# Patient Record
Sex: Male | Born: 1935 | Race: White | Hispanic: No | Marital: Married | Smoking: Former smoker
Health system: Southern US, Community
[De-identification: ages and names within clinical notes are randomized; demographics above are authoritative.]

## PROBLEM LIST (undated history)

## (undated) DIAGNOSIS — E785 Hyperlipidemia, unspecified: Secondary | ICD-10-CM

## (undated) DIAGNOSIS — R7309 Other abnormal glucose: Secondary | ICD-10-CM

## (undated) DIAGNOSIS — M1990 Unspecified osteoarthritis, unspecified site: Secondary | ICD-10-CM

## (undated) DIAGNOSIS — M899 Disorder of bone, unspecified: Secondary | ICD-10-CM

## (undated) DIAGNOSIS — M949 Disorder of cartilage, unspecified: Secondary | ICD-10-CM

## (undated) DIAGNOSIS — I4891 Unspecified atrial fibrillation: Secondary | ICD-10-CM

## (undated) DIAGNOSIS — E291 Testicular hypofunction: Secondary | ICD-10-CM

## (undated) DIAGNOSIS — J8410 Pulmonary fibrosis, unspecified: Secondary | ICD-10-CM

## (undated) DIAGNOSIS — G4730 Sleep apnea, unspecified: Secondary | ICD-10-CM

## (undated) DIAGNOSIS — H9190 Unspecified hearing loss, unspecified ear: Secondary | ICD-10-CM

## (undated) DIAGNOSIS — J449 Chronic obstructive pulmonary disease, unspecified: Secondary | ICD-10-CM

## (undated) HISTORY — DX: Other abnormal glucose: R73.09

## (undated) HISTORY — DX: Sleep apnea, unspecified: G47.30

## (undated) HISTORY — DX: Disorder of bone, unspecified: M89.9

## (undated) HISTORY — DX: Unspecified hearing loss, unspecified ear: H91.90

## (undated) HISTORY — DX: Hyperlipidemia, unspecified: E78.5

## (undated) HISTORY — DX: Chronic obstructive pulmonary disease, unspecified: J44.9

## (undated) HISTORY — DX: Testicular hypofunction: E29.1

## (undated) HISTORY — PX: ROTATOR CUFF REPAIR: SHX139

## (undated) HISTORY — DX: Unspecified osteoarthritis, unspecified site: M19.90

## (undated) HISTORY — DX: Disorder of cartilage, unspecified: M94.9

## (undated) HISTORY — PX: TONSILLECTOMY: SUR1361

---
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
## 2004-02-19 ENCOUNTER — Ambulatory Visit: Payer: Self-pay | Admitting: Internal Medicine

## 2004-06-28 ENCOUNTER — Ambulatory Visit: Payer: Self-pay | Admitting: Internal Medicine

## 2004-07-31 ENCOUNTER — Ambulatory Visit: Payer: Self-pay | Admitting: Internal Medicine

## 2004-10-29 ENCOUNTER — Ambulatory Visit: Payer: Self-pay | Admitting: Internal Medicine

## 2004-10-30 ENCOUNTER — Ambulatory Visit (HOSPITAL_COMMUNITY): Admission: RE | Admit: 2004-10-30 | Discharge: 2004-10-31 | Payer: Self-pay | Admitting: Orthopedic Surgery

## 2005-01-03 ENCOUNTER — Ambulatory Visit: Payer: Self-pay | Admitting: Internal Medicine

## 2005-07-28 ENCOUNTER — Ambulatory Visit: Payer: Self-pay | Admitting: Internal Medicine

## 2005-09-17 ENCOUNTER — Ambulatory Visit: Payer: Self-pay | Admitting: Internal Medicine

## 2006-02-11 ENCOUNTER — Ambulatory Visit: Payer: Self-pay | Admitting: Internal Medicine

## 2006-02-11 LAB — CONVERTED CEMR LAB
AST: 32 units/L (ref 0–37)
Cholesterol: 94 mg/dL (ref 0–200)
HDL: 32.5 mg/dL — ABNORMAL LOW (ref >39.0)
Triglyceride fasting, serum: 68 mg/dL (ref 0–149)

## 2006-07-29 DIAGNOSIS — E785 Hyperlipidemia, unspecified: Secondary | ICD-10-CM | POA: Insufficient documentation

## 2006-07-31 ENCOUNTER — Ambulatory Visit: Payer: Self-pay | Admitting: Internal Medicine

## 2006-08-14 ENCOUNTER — Encounter: Payer: Self-pay | Admitting: Internal Medicine

## 2006-08-14 ENCOUNTER — Ambulatory Visit: Payer: Self-pay | Admitting: Family Medicine

## 2006-08-18 ENCOUNTER — Telehealth (INDEPENDENT_AMBULATORY_CARE_PROVIDER_SITE_OTHER): Payer: Self-pay | Admitting: *Deleted

## 2006-08-18 LAB — CONVERTED CEMR LAB
Calcium: 8.7 mg/dL (ref 8.4–10.5)
Chloride: 103 meq/L (ref 96–112)
GFR calc Af Amer: 94 mL/min
GFR calc non Af Amer: 78 mL/min
Glucose, Bld: 112 mg/dL — ABNORMAL HIGH (ref 70–99)
PSA: 1.44 ng/mL (ref 0.10–4.00)
Sodium: 137 meq/L (ref 135–145)

## 2006-09-29 ENCOUNTER — Ambulatory Visit: Payer: Self-pay | Admitting: Internal Medicine

## 2006-09-30 ENCOUNTER — Encounter: Payer: Self-pay | Admitting: Internal Medicine

## 2006-10-06 ENCOUNTER — Telehealth: Payer: Self-pay | Admitting: Internal Medicine

## 2006-11-16 ENCOUNTER — Ambulatory Visit: Payer: Self-pay | Admitting: Internal Medicine

## 2006-11-18 LAB — CONVERTED CEMR LAB
Albumin: 3.5 g/dL (ref 3.5–5.2)
Bilirubin, Direct: 0.1 mg/dL (ref 0.0–0.3)
Total Bilirubin: 0.7 mg/dL (ref 0.3–1.2)
Total Protein: 6.7 g/dL (ref 6.0–8.3)

## 2007-07-21 ENCOUNTER — Telehealth (INDEPENDENT_AMBULATORY_CARE_PROVIDER_SITE_OTHER): Payer: Self-pay | Admitting: *Deleted

## 2007-08-19 ENCOUNTER — Telehealth (INDEPENDENT_AMBULATORY_CARE_PROVIDER_SITE_OTHER): Payer: Self-pay | Admitting: *Deleted

## 2007-11-15 ENCOUNTER — Ambulatory Visit: Payer: Self-pay | Admitting: Internal Medicine

## 2007-11-15 DIAGNOSIS — M899 Disorder of bone, unspecified: Secondary | ICD-10-CM | POA: Insufficient documentation

## 2007-11-15 DIAGNOSIS — M949 Disorder of cartilage, unspecified: Secondary | ICD-10-CM

## 2007-11-15 HISTORY — DX: Disorder of bone, unspecified: M89.9

## 2007-12-07 ENCOUNTER — Telehealth (INDEPENDENT_AMBULATORY_CARE_PROVIDER_SITE_OTHER): Payer: Self-pay | Admitting: *Deleted

## 2007-12-16 ENCOUNTER — Telehealth (INDEPENDENT_AMBULATORY_CARE_PROVIDER_SITE_OTHER): Payer: Self-pay | Admitting: *Deleted

## 2008-04-03 ENCOUNTER — Telehealth (INDEPENDENT_AMBULATORY_CARE_PROVIDER_SITE_OTHER): Payer: Self-pay | Admitting: *Deleted

## 2008-04-03 ENCOUNTER — Ambulatory Visit: Payer: Self-pay | Admitting: Family Medicine

## 2008-04-19 ENCOUNTER — Telehealth (INDEPENDENT_AMBULATORY_CARE_PROVIDER_SITE_OTHER): Payer: Self-pay | Admitting: *Deleted

## 2008-05-15 ENCOUNTER — Telehealth (INDEPENDENT_AMBULATORY_CARE_PROVIDER_SITE_OTHER): Payer: Self-pay | Admitting: *Deleted

## 2008-05-17 ENCOUNTER — Ambulatory Visit: Payer: Self-pay | Admitting: Internal Medicine

## 2008-05-17 DIAGNOSIS — J309 Allergic rhinitis, unspecified: Secondary | ICD-10-CM | POA: Insufficient documentation

## 2008-05-17 DIAGNOSIS — R7309 Other abnormal glucose: Secondary | ICD-10-CM

## 2008-05-17 DIAGNOSIS — R7303 Prediabetes: Secondary | ICD-10-CM | POA: Insufficient documentation

## 2008-05-17 HISTORY — DX: Other abnormal glucose: R73.09

## 2008-05-18 LAB — CONVERTED CEMR LAB: Hgb A1c MFr Bld: 5.9 % (ref 4.6–6.0)

## 2008-10-26 ENCOUNTER — Encounter: Payer: Self-pay | Admitting: Internal Medicine

## 2008-10-26 ENCOUNTER — Ambulatory Visit: Payer: Self-pay | Admitting: Family Medicine

## 2008-11-15 ENCOUNTER — Ambulatory Visit: Payer: Self-pay | Admitting: Internal Medicine

## 2008-11-20 ENCOUNTER — Encounter (INDEPENDENT_AMBULATORY_CARE_PROVIDER_SITE_OTHER): Payer: Self-pay | Admitting: *Deleted

## 2008-11-20 LAB — CONVERTED CEMR LAB
ALT: 21 units/L (ref 0–53)
AST: 31 units/L (ref 0–37)
Calcium: 9.3 mg/dL (ref 8.4–10.5)
Chloride: 104 meq/L (ref 96–112)
Cholesterol: 141 mg/dL (ref 0–200)
HDL: 30.4 mg/dL — ABNORMAL LOW (ref >39.00)
Hgb A1c MFr Bld: 5.8 % (ref 4.6–6.5)
Potassium: 4.1 meq/L (ref 3.5–5.1)
Total CHOL/HDL Ratio: 5
VLDL: 40 mg/dL (ref 0.0–40.0)

## 2008-12-19 ENCOUNTER — Encounter (INDEPENDENT_AMBULATORY_CARE_PROVIDER_SITE_OTHER): Payer: Self-pay | Admitting: *Deleted

## 2008-12-19 ENCOUNTER — Ambulatory Visit: Payer: Self-pay | Admitting: Internal Medicine

## 2009-05-08 ENCOUNTER — Ambulatory Visit: Payer: Self-pay | Admitting: Internal Medicine

## 2009-07-06 ENCOUNTER — Ambulatory Visit: Payer: Self-pay | Admitting: Internal Medicine

## 2009-07-11 LAB — CONVERTED CEMR LAB
AST: 29 units/L (ref 0–37)
Creatinine, Ser: 1 mg/dL (ref 0.4–1.5)
HDL: 31 mg/dL — ABNORMAL LOW (ref >39.00)
LDL Cholesterol: 68 mg/dL (ref 0–99)
Potassium: 4 meq/L (ref 3.5–5.1)
Sodium: 139 meq/L (ref 135–145)
Triglycerides: 194 mg/dL — ABNORMAL HIGH (ref 0.0–149.0)

## 2009-09-10 ENCOUNTER — Ambulatory Visit: Payer: Self-pay | Admitting: Internal Medicine

## 2009-11-05 ENCOUNTER — Ambulatory Visit: Payer: Self-pay | Admitting: Internal Medicine

## 2009-11-05 DIAGNOSIS — H9190 Unspecified hearing loss, unspecified ear: Secondary | ICD-10-CM

## 2009-11-05 HISTORY — DX: Unspecified hearing loss, unspecified ear: H91.90

## 2009-11-05 LAB — CONVERTED CEMR LAB
Basophils Absolute: 0.1 10*3/uL (ref 0.0–0.1)
Basophils Relative: 0.7 % (ref 0.0–3.0)
Eosinophils Relative: 11.4 % — ABNORMAL HIGH (ref 0.0–5.0)
Hemoglobin: 13.9 g/dL (ref 13.0–17.0)
Hgb A1c MFr Bld: 6.1 % (ref 4.6–6.5)
Lymphocytes Relative: 39.4 % (ref 12.0–46.0)
MCHC: 34.7 g/dL (ref 30.0–36.0)
MCV: 96.5 fL (ref 78.0–100.0)
Monocytes Relative: 8.1 % (ref 3.0–12.0)
Platelets: 241 10*3/uL (ref 150.0–400.0)
RBC: 4.14 M/uL — ABNORMAL LOW (ref 4.22–5.81)
TSH: 1.62 microintl units/mL (ref 0.35–5.50)

## 2009-11-06 ENCOUNTER — Ambulatory Visit: Payer: Self-pay | Admitting: Internal Medicine

## 2009-11-20 ENCOUNTER — Telehealth: Payer: Self-pay | Admitting: Internal Medicine

## 2009-11-27 ENCOUNTER — Encounter: Payer: Self-pay | Admitting: Internal Medicine

## 2010-04-21 LAB — CONVERTED CEMR LAB
AST: 29 units/L (ref 0–37)
Basophils Relative: 0.4 % (ref 0.0–3.0)
CO2: 32 meq/L (ref 19–32)
Calcium: 9.3 mg/dL (ref 8.4–10.5)
Chloride: 109 meq/L (ref 96–112)
Eosinophils Absolute: 0.2 10*3/uL (ref 0.0–0.7)
Eosinophils Relative: 2.6 % (ref 0.0–5.0)
GFR calc Af Amer: 106 mL/min
GFR calc non Af Amer: 88 mL/min
Glucose, Bld: 102 mg/dL — ABNORMAL HIGH (ref 70–99)
Hemoglobin: 13.8 g/dL (ref 13.0–17.0)
Lymphocytes Relative: 45.8 % (ref 12.0–46.0)
MCV: 97.2 fL (ref 78.0–100.0)
Potassium: 4.3 meq/L (ref 3.5–5.1)
TSH: 1.69 microintl units/mL (ref 0.35–5.50)
Total CHOL/HDL Ratio: 4.4
VLDL: 22 mg/dL (ref 0–40)

## 2010-04-23 NOTE — Consult Note (Signed)
Summary: [facility]  [facility]   Imported By: Lanelle Bal 12/06/2009 10:50:04  _____________________________________________________________________  External Attachment:    Type:   Image     Comment:   External Document

## 2010-04-23 NOTE — Assessment & Plan Note (Signed)
Summary: 6 mth fu/kdc   Vital Signs:  Patient profile:   77 year old male Weight:      185.50 pounds Pulse rate:   63 / minute Pulse rhythm:   regular BP sitting:   122 / 76  (left arm) Cuff size:   large  Vitals Entered By: Army Fossa CMA (November 05, 2009 7:59 AM) CC: 6 month f/u :Fasting   History of Present Illness: ROV ENT? has hearing loss  Hyperlipidemia-- good medication compliance  borderline DM--  diet continue to be  healthy     Current Medications (verified): 1)  Aspirin 81 Mg Tabs (Aspirin) .Marland Kitchen.. 1 Tablet By Mouth A Day 2)  Mvi .Marland Kitchen.. 1 Tablet Every Day 3)  Pravachol 40 Mg Tabs (Pravastatin Sodium) .Marland Kitchen.. 1 By Mouth At Bedtime 4)  Caltrate 600+d 600-400 Mg-Unit Tabs (Calcium Carbonate-Vitamin D) .... Qd  Allergies: 1)  ! * Tetanus 2)  ! * Flu Vaccination  Past History:  Past Medical History: Reviewed history from 09/10/2009 and no changes required. Hyperlipidemia Osteopenia per DEXA 07-2006, declined medicines borderline DM ---> A1C 5.9  04-2008 Allergic Rhinitis   Past Surgical History: Reviewed history from 11/15/2007 and no changes required. Tonsillectomy and adenoidectomy  Social History: Reviewed history from 05/08/2009 and no changes required. Married 1 child Former Smoker (quit after 51 years on 11/23/2007) Alcohol use-yes (occasionally) Regular exercise-yes (4-5 x/wk) [employer] x 25 years   Review of Systems ENT:  on tinnitus no ear pain. CV:  Denies chest pain or discomfort and swelling of feet. Resp:  Denies cough, coughing up blood, sputum productive, and wheezing; goes camping, outdoor activities without dyspnea on exertion.  Physical Exam  General:  alert, well-developed, and well-nourished.   Ears:  R ear normal and L ear normal.   Lungs:  normal respiratory effort, no intercostal retractions, no accessory muscle use,   normal breath sounds;few dry crackers at the bases? Heart:  normal rate, regular rhythm, no murmur, and no gallop.    Extremities:  no pretibial edema bilaterally  Psych:  Cognition and judgment appear intact. Alert and cooperative with normal attention span and concentration. not anxious appearing and not depressed appearing.     Impression & Recommendations:  Problem # 1:  HYPERGLYCEMIA (ICD-790.29) due for labs  Labs Reviewed: Creat: 1.0 (07/06/2009)     Orders: Venipuncture (83151) TLB-A1C / Hgb A1C (Glycohemoglobin) (83036-A1C) TLB-Microalbumin/Creat Ratio, Urine (82043-MALB) Specimen Handling (76160)  Problem # 2:  OSTEOPENIA (ICD-733.90) labs His updated medication list for this problem includes:    Caltrate 600+d 600-400 Mg-unit Tabs (Calcium carbonate-vitamin d) ..... Qd  Vit D:30 (11/15/2008)  Orders: TLB-TSH (Thyroid Stimulating Hormone) (84443-TSH) TLB-CBC Platelet - w/Differential (85025-CBCD)  Problem # 3:  HYPERLIPIDEMIA (ICD-272.4) well controlled His updated medication list for this problem includes:    Pravachol 40 Mg Tabs (Pravastatin sodium) .Marland Kitchen... 1 by mouth at bedtime  Problem # 4:  DECREASED HEARING (ICD-389.9) decreased hearing as reported by the patient Recommend to see an audiologist, numbers provided  Problem # 5:  ? of COPD (ICD-496) patient is a former heavy smoker, totally asymptomatic however on exam there is some few dry crackles at the bases. Will order a chest x-ray  Orders: T-2 View CXR (71020TC) Specimen Handling (73710)  Complete Medication List: 1)  Aspirin 81 Mg Tabs (Aspirin) .Marland Kitchen.. 1 tablet by mouth a day 2)  Mvi  .Marland Kitchen.. 1 tablet every day 3)  Pravachol 40 Mg Tabs (Pravastatin sodium) .Marland Kitchen.. 1 by mouth at bedtime  4)  Caltrate 600+d 600-400 Mg-unit Tabs (Calcium carbonate-vitamin d) .... Qd  Patient Instructions: 1)  Please schedule a follow-up appointment in 6 months , fasting, yearly checkup Prescriptions: PRAVACHOL 40 MG TABS (PRAVASTATIN SODIUM) 1 by mouth at bedtime  #30 x 6   Entered by:   Army Fossa CMA   Authorized by:   Nolon Rod.  Paz MD   Signed by:   Army Fossa CMA on 11/05/2009   Method used:   Electronically to        [facility] (retail)       [address]       [place], [place]  [address]       Ph: [phone] or [phone]       Fax: [phone]   RxID:   [id number]

## 2010-04-23 NOTE — Letter (Signed)
Summary: Physical Exam Form/[employer]  Physical Exam Form/[employer]   Imported By: Lanelle Bal 09/14/2009 13:51:47  _____________________________________________________________________  External Attachment:    Type:   Image     Comment:   External Document

## 2010-04-23 NOTE — Assessment & Plan Note (Signed)
Summary: fill out form for [employer]   Vital Signs:  Patient profile:   77 year old male Height:      71 inches Weight:      186 pounds Temp:     98.3 degrees F oral Pulse rate:   80 / minute BP sitting:   130 / 78  (left arm)  Vitals Entered By: Jeremy Johann CMA (September 10, 2009 2:05 PM) CC: fill out forms Comments REVIEWED MED LIST, PATIENT AGREED DOSE AND INSTRUCTION CORRECT    History of Present Illness: ROV needs a form filled, very active, no problems     Allergies: 1)  ! * Tetanus 2)  ! * Flu Vaccination  Past History:  Past Medical History: Hyperlipidemia Osteopenia per DEXA 07-2006, declined medicines borderline DM ---> A1C 5.9  04-2008 Allergic Rhinitis   Past Surgical History: Reviewed history from 11/15/2007 and no changes required. Tonsillectomy and adenoidectomy  Social History: Reviewed history from 05/08/2009 and no changes required. Married 1 child Former Smoker (quit after 51 years on 11/23/2007) Alcohol use-yes (occasionally) Regular exercise-yes (4-5 x/wk) [employer] x 25 years   Review of Systems CV:  Denies chest pain or discomfort, palpitations, and swelling of feet. Resp:  Denies cough and shortness of breath. GI:  Denies bloody stools, nausea, and vomiting. Allergy:  symptoms well controlled at present.  Physical Exam  General:  alert, well-developed, and well-nourished.   Ears:  R ear normal and L ear normal.   Mouth:  no redness or d/c  Neck:  no masses, no thyromegaly, normal carotid upstroke, and no carotid bruits.   Lungs:  normal respiratory effort, no intercostal retractions, no accessory muscle use, and normal breath sounds.   Heart:  normal rate, regular rhythm, no murmur, and no gallop.   Abdomen:  soft, non-tender, no distention, and no masses.     Impression & Recommendations:  Problem # 1:  HYPERLIPIDEMIA (ICD-272.4) no change His updated medication list for this problem includes:    Pravachol 40 Mg Tabs  (Pravastatin sodium) .Marland Kitchen... 1 by mouth at bedtime  Labs Reviewed: SGOT: 29 (07/06/2009)   SGPT: 20 (07/06/2009)   HDL:31.00 (07/06/2009), 30.40 (11/15/2008)  LDL:68 (07/06/2009), 71 (11/15/2008)  Chol:138 (07/06/2009), 141 (11/15/2008)  Trig:194.0 (07/06/2009), 200.0 (11/15/2008)  Problem # 2:  HYPERGLYCEMIA (ICD-790.29) A1c 8-11 5.8, recheck yearly Labs Reviewed: Creat: 1.0 (07/06/2009)     Problem # 3:  ALLERGIC RHINITIS (ICD-477.9) asymptomatic  Problem # 4:  forms filled  Complete Medication List: 1)  Aspirin 81 Mg Tabs (Aspirin) .Marland Kitchen.. 1 tablet by mouth a day 2)  Mvi  .Marland Kitchen.. 1 tablet every day 3)  Pravachol 40 Mg Tabs (Pravastatin sodium) .Marland Kitchen.. 1 by mouth at bedtime  Patient Instructions: 1)  Please schedule a follow-up appointment in 3 months : fasting, yearly checkup   Tetanus/Td Immunization History:    Tetanus/Td # 1:  allergic to Td  (09/10/2009)

## 2010-04-23 NOTE — Progress Notes (Signed)
Summary: labs and x-ray results  Phone Note Outgoing Call   Summary of Call: advised patient -chest x-ray looks okay; the right shoulder showed resorption of the clavicle, if he is having pain in the right shoulder, please refer to orthopedic surgery. this is likely a complication of heavy use of the shoulder. -his sugar is stable Jose E. Paz MD  November 20, 2009 12:11 PM   Follow-up for Phone Call        Pt is aware, discussed the Right shoulder issue- he states he did not have surgery in the past and he is okay with seeing the ortho surgeon. Army Fossa CMA  November 20, 2009 12:36 PM   New Problems: SHOULDER PAIN, RIGHT (ICD-719.41)   New Problems: SHOULDER PAIN, RIGHT (ICD-719.41)

## 2010-04-23 NOTE — Assessment & Plan Note (Signed)
Summary: 6 mth/ns/kdc   Vital Signs:  Patient profile:   77 year old male Height:      71 inches Weight:      179.8 pounds BMI:     25.17 Pulse rate:   60 / minute BP sitting:   150 / 80  Vitals Entered By: Dena Billet CC: f/u Comments Generic for Advicor? pt. complains of hard to breath when going to sleep, wheezing   History of Present Illness: ROV ----> doing well   Allergies: 1)  ! * Tetanus 2)  ! * Flu Vaccination  Past History:  Past Medical History: Hyperlipidemia Osteopenia per DEXA 07-2006, declined medicines borderline DM ---> A1C 5.9  04-2008 Allergic Rhinitis   Past Surgical History: Reviewed history from 11/15/2007 and no changes required. Tonsillectomy and adenoidectomy  Social History: Reviewed history from 11/15/2008 and no changes required. Married 1 child Former Smoker (quit after 51 years on 11/23/2007) Alcohol use-yes (occasionally) Regular exercise-yes (4-5 x/wk) [employer] x 25 years   Review of Systems       Hyperlipidemia-- advicor $$$ borderline DM-- very active, watching his diet  BP slightly elevated today, no ambulatory BPs     Physical Exam  General:  alert, well-developed, and well-nourished.   Lungs:  normal respiratory effort, no intercostal retractions, no accessory muscle use, and normal breath sounds.   Heart:  normal rate, regular rhythm, no murmur, and no gallop.   Extremities:  no pretibial edema bilaterally    Impression & Recommendations:  Problem # 1:  HYPERLIPIDEMIA (ICD-272.4) advicor ----> $$$ switch to pravachol see instructions  The following medications were removed from the medication list:    Advicor 1000-20 Mg Tb24 (Niacin-lovastatin) .Marland Kitchen... 1 by mouth qd His updated medication list for this problem includes:    Pravachol 40 Mg Tabs (Pravastatin sodium) .Marland Kitchen... 1 by mouth at bedtime  Problem # 2:  HYPERGLYCEMIA (ICD-790.29) stable  per last A1C  Complete Medication List: 1)  Aspirin 81 Mg Tabs  (Aspirin) .Marland Kitchen.. 1 tablet by mouth a day 2)  Mvi  .Marland Kitchen.. 1 tablet every day 3)  Pravachol 40 Mg Tabs (Pravastatin sodium) .Marland Kitchen.. 1 by mouth at bedtime  Patient Instructions: 1)  switch to pravachol 2)  call if side effects 3)  blood work in 2 months , fasting: 4)  FLP , AST ALT BMP  Dx high chol 5)  Please schedule a follow-up appointment in 6 months .  Prescriptions: PRAVACHOL 40 MG TABS (PRAVASTATIN SODIUM) 1 by mouth at bedtime  #30 x 6   Entered and Authorized by:   Nolon Rod. Gardiner Espana MD   Signed by:   Nolon Rod. Toiya Morrish MD on 05/08/2009   Method used:   Print then Give to Patient   RxID:   [id number]

## 2010-05-07 ENCOUNTER — Other Ambulatory Visit: Payer: Self-pay | Admitting: Internal Medicine

## 2010-05-07 ENCOUNTER — Encounter: Payer: Self-pay | Admitting: Internal Medicine

## 2010-05-07 ENCOUNTER — Encounter (INDEPENDENT_AMBULATORY_CARE_PROVIDER_SITE_OTHER): Payer: 59 | Admitting: Internal Medicine

## 2010-05-07 DIAGNOSIS — E785 Hyperlipidemia, unspecified: Secondary | ICD-10-CM

## 2010-05-07 DIAGNOSIS — Z0000 Encounter for general adult medical examination without abnormal findings: Secondary | ICD-10-CM

## 2010-05-07 DIAGNOSIS — R7309 Other abnormal glucose: Secondary | ICD-10-CM

## 2010-05-07 DIAGNOSIS — Z87891 Personal history of nicotine dependence: Secondary | ICD-10-CM

## 2010-05-07 DIAGNOSIS — Z125 Encounter for screening for malignant neoplasm of prostate: Secondary | ICD-10-CM

## 2010-05-07 DIAGNOSIS — R0989 Other specified symptoms and signs involving the circulatory and respiratory systems: Secondary | ICD-10-CM

## 2010-05-07 LAB — PSA: PSA: 1.14 ng/mL (ref 0.10–4.00)

## 2010-05-07 LAB — ALT: ALT: 21 U/L (ref 0–53)

## 2010-05-07 LAB — BASIC METABOLIC PANEL
CO2: 29 mEq/L (ref 19–32)
Chloride: 104 mEq/L (ref 96–112)
Creatinine, Ser: 1 mg/dL (ref 0.4–1.5)
GFR: 77.25 mL/min (ref >60.00)
Glucose, Bld: 99 mg/dL (ref 70–99)
Potassium: 4.3 mEq/L (ref 3.5–5.1)

## 2010-05-07 LAB — LIPID PANEL
Cholesterol: 117 mg/dL (ref 0–200)
HDL: 27.6 mg/dL — ABNORMAL LOW (ref >39.00)
Triglycerides: 144 mg/dL (ref 0.0–149.0)

## 2010-05-07 LAB — AST: AST: 29 U/L (ref 0–37)

## 2010-05-08 ENCOUNTER — Encounter (INDEPENDENT_AMBULATORY_CARE_PROVIDER_SITE_OTHER): Payer: Self-pay | Admitting: *Deleted

## 2010-05-09 ENCOUNTER — Encounter: Payer: Self-pay | Admitting: Internal Medicine

## 2010-05-09 ENCOUNTER — Encounter (INDEPENDENT_AMBULATORY_CARE_PROVIDER_SITE_OTHER): Payer: Medicare Other

## 2010-05-09 DIAGNOSIS — J449 Chronic obstructive pulmonary disease, unspecified: Secondary | ICD-10-CM

## 2010-05-15 ENCOUNTER — Encounter: Payer: Self-pay | Admitting: Internal Medicine

## 2010-05-15 NOTE — Letter (Signed)
Summary: Physical Exam for [employer]  Physical Exam for [employer]   Imported By: Maryln Gottron 05/09/2010 10:51:20  _____________________________________________________________________  External Attachment:    Type:   Image     Comment:   External Document

## 2010-05-15 NOTE — Assessment & Plan Note (Signed)
Summary: YEARLY CHECK//CBS/PH   Vital Signs:  Patient profile:   77 year old male Height:      71.5 inches Weight:      188.25 pounds BMI:     25.98 Pulse rate:   80 / minute Pulse rhythm:   regular BP sitting:   122 / 80  (left arm) Cuff size:   large  Vitals Entered By: Army Fossa CMA (May 07, 2010 8:18 AM) 1    CC: cpx, fasting  Comments no complaints  declines pneumvax  not had colonoscopy [facility]    History of Present Illness: Here for Medicare AWV:  1.   Risk factors based on Past M, S, F history: reviewed  2.   Physical Activities: very active, walks daily, takes hikes w/ [employer]  3.   Depression/mood: no problems  4.   Hearing:   some decrease hearing but no problems reported or noted during a normal conversation 5.   ADL's: totally independent  6.   Fall Risk: low risk 7.   Home Safety: does feel safe  8.   Height, weight, &visual acuity: see VS, vision corrected w/ glasses  9.   Counseling: yes  10.   Labs ordered based on risk factors: yes  11.           Referral Coordination, if needed  12.           Care Plan, see a/p  13.            Cognitive Assessment: cognition and memory seem appropiate for age   in addition, we discussed the following issues Needs a form for the [employer] Hyperlipidemia-- good medication compliance  Osteopenia per DEXA 07-2006, declined medicines but takes Ca and Vit D borderline DM --->diet very healthy, no ambulatory CBGs   Current Medications (verified): 1)  Aspirin 81 Mg Tabs (Aspirin) .Marland Kitchen.. 1 Tablet By Mouth A Day 2)  Mvi .Marland Kitchen.. 1 Tablet Every Day 3)  Pravachol 40 Mg Tabs (Pravastatin Sodium) .Marland Kitchen.. 1 By Mouth At Bedtime 4)  Caltrate 600+d 600-400 Mg-Unit Tabs (Calcium Carbonate-Vitamin D) .... Qd  Allergies (verified): 1)  ! * Tetanus 2)  ! * Flu Vaccination  Past History:  Past Medical History: Reviewed history from 09/10/2009 and no changes required. Hyperlipidemia Osteopenia per DEXA 07-2006,  declined medicines borderline DM ---> A1C 5.9  04-2008 Allergic Rhinitis   Past Surgical History: Reviewed history from 11/15/2007 and no changes required. Tonsillectomy and adenoidectomy  Family History: Reviewed history from 11/15/2007 and no changes required. breast Ca - M HTN - F DM - no prostate Ca - no CAD - no stroke - no colon ca -- no  Social History: Reviewed history from 05/08/2009 and no changes required. Married 1 child Former Smoker (quit after 51 years on 11/23/2007) Alcohol use-yes (occasionally) Regular exercise-yes (4-5 x/wk) [employer] x 25 years   Review of Systems General:  Denies fatigue and fever; some wt gain . CV:  Denies chest pain or discomfort and swelling of feet. Resp:  Denies cough and shortness of breath. GI:  Denies bloody stools, diarrhea, and nausea. GU:  Denies hematuria, urinary frequency, and urinary hesitancy.  Physical Exam  General:  alert, well-developed, and well-nourished.   Neck:  no masses, no thyromegaly, normal carotid upstroke, and no carotid bruits.   Lungs:  normal respiratory effort, no intercostal retractions, no accessory muscle use,   normal breath sounds;few dry crackers at the bases  Heart:  normal  rate, regular rhythm, no murmur, and no gallop.   Abdomen:  soft, non-tender, no distention, and no masses.   Rectal:  + ext hemorrhoids . Normal sphincter tone. No rectal masses or tenderness. hem neg  Prostate:  Prostate gland firm and smooth, no enlargement, nodularity, tenderness, mass, asymmetry or induration. Extremities:  no pretibial edema bilaterally  Neurologic:  alert & oriented X3, strength normal in all extremities, and gait normal.   Psych:  Cognition and judgment appear intact. Alert and cooperative with normal attention span and concentration.    Impression & Recommendations:  Problem # 1:  HEALTH SCREENING (ICD-V70.0) Td-- allergic influenza shot-- allergic pneumonia shot--declined Shingles  immunization--benefits discussed, prescription provided  hemocults (-) 2005,2006,2007 iFOB neg 2010 Colonoscopy Vs.iFOB  reviewed w/ pt. Provided iFOB  in case he decide to do them, otherwise, he will call and ask for a colonoscopy   EKG @ baseline   DRE neg, check a PSA  encouraged to continue w/ healthy life style [employer] form completed   Orders: EKG w/ Interpretation (93000) Medicare -1st Annual Wellness Visit [phone])  Problem # 2:  ? of COPD (ICD-496)  patient is a former heavy smoker, totally asymptomatic however on exam there is some few dry crackles at the bases. chest x-ray last year showed some hyperinflation Spirometry-- constrictive pattern, ?good effort ----> get formal PFTs  Orders: Spirometry w/Graph (94010) Pulmonary Referral (Pulmonary)  Problem # 3:  HYPERGLYCEMIA (ICD-790.29)  Orders: TLB-A1C / Hgb A1C (Glycohemoglobin) (83036-A1C)  Problem # 4:  OSTEOPENIA (ICD-733.90) per DEXA 07-2006, has declined medicines before last Vit D 20120 normal on Ca and Vit D repeat dexa?  will think about it His updated medication list for this problem includes:    Caltrate 600+d 600-400 Mg-unit Tabs (Calcium carbonate-vitamin d) ..... Qd  Problem # 5:  HYPERLIPIDEMIA (ICD-272.4) labs  His updated medication list for this problem includes:    Pravachol 40 Mg Tabs (Pravastatin sodium) .Marland Kitchen... 1 by mouth at bedtime  Labs Reviewed: SGOT: 29 (07/06/2009)   SGPT: 20 (07/06/2009)   HDL:31.00 (07/06/2009), 30.40 (11/15/2008)  LDL:68 (07/06/2009), 71 (11/15/2008)  Chol:138 (07/06/2009), 141 (11/15/2008)  Trig:194.0 (07/06/2009), 200.0 (11/15/2008)  Orders: Venipuncture ([address]) TLB-BMP (Basic Metabolic Panel-BMET) (80048-METABOL) TLB-ALT (SGPT) (84460-ALT) TLB-AST (SGOT) (84450-SGOT) TLB-Lipid Panel (80061-LIPID) Specimen Handling (09811) EKG w/ Interpretation (93000)  Complete Medication List: 1)  Aspirin 81 Mg Tabs (Aspirin) .Marland Kitchen.. 1 tablet by mouth a day 2)  Mvi   .Marland Kitchen.. 1 tablet every day 3)  Pravachol 40 Mg Tabs (Pravastatin sodium) .Marland Kitchen.. 1 by mouth at bedtime 4)  Caltrate 600+d 600-400 Mg-unit Tabs (Calcium carbonate-vitamin d) .... Qd 5)  Zostavax 91478 Unt/0.78ml Solr (Zoster vaccine live) .Marland Kitchen.. 1 s.q.  Other Orders: TLB-PSA (Prostate Specific Antigen) (84153-PSA)  Patient Instructions: 1)  Please schedule a follow-up appointment in 6 months .  Prescriptions: ZOSTAVAX 29562 UNT/0.65ML SOLR (ZOSTER VACCINE LIVE) 1 s.q.  #1 x 0   Entered and Authorized by:   Nolon Rod. Layne Lebon MD   Signed by:   Nolon Rod. Shlomo Seres MD on 05/07/2010   Method used:   Print then Give to Patient   RxID:   [id number]    Orders Added: 1)  Venipuncture [84132] 2)  TLB-BMP (Basic Metabolic Panel-BMET) [80048-METABOL] 3)  TLB-ALT (SGPT) [84460-ALT] 4)  TLB-AST (SGOT) [84450-SGOT] 5)  TLB-A1C / Hgb A1C (Glycohemoglobin) [83036-A1C] 6)  TLB-PSA (Prostate Specific Antigen) [84153-PSA] 7)  TLB-Lipid Panel [80061-LIPID] 8)  Specimen Handling [99000] 9)  EKG w/ Interpretation [93000] 10)  Spirometry w/Graph [94010] 11)  Pulmonary Referral [Pulmonary] 12)  Medicare -1st Annual Wellness Visit [G0438] 13)  Est. Patient Level III [04540]

## 2010-05-15 NOTE — Letter (Signed)
Summary: [facility]: Immunoassay Fecal Occult Blood (iFOB) Order Form  [facility]  [address] [place], [place] [address]   Phone: [phone]  Fax: [phone]      [facility]: Immunoassay Fecal Occult Blood (iFOB) Order Form   May 08, 2010 MRN: [id number]   Joshua Bullock January 22, 1933   Physicican Name:____jose paz,md _____________________  Diagnosis Code:_____v76.51_____________________      Army Fossa CMA

## 2010-06-04 NOTE — Assessment & Plan Note (Signed)
Summary: copd/ pft per dr paz/ rene brassfield //kp   Allergies: 1)  ! * Tetanus 2)  ! * Flu Vaccination   Other Orders: Carbon Monoxide diffusing w/capacity ([address]) Lung Volumes/Gas dilution or washout ([address]) Spirometry (Pre & Post) [phone])

## 2010-06-18 ENCOUNTER — Other Ambulatory Visit: Payer: Self-pay | Admitting: Internal Medicine

## 2010-06-18 MED ORDER — PRAVASTATIN SODIUM 40 MG PO TABS: 40.0000 mg | ORAL_TABLET | Freq: Every day | ORAL | Status: DC

## 2010-06-21 ENCOUNTER — Other Ambulatory Visit: Payer: Medicare Other

## 2010-06-21 DIAGNOSIS — Z1211 Encounter for screening for malignant neoplasm of colon: Secondary | ICD-10-CM

## 2010-08-09 NOTE — Op Note (Signed)
NAME:  Joshua Bullock, Joshua Bullock NO.:  [id number]   MEDICAL RECORD NO.:  [id number]          PATIENT TYPE:  OIB   LOCATION:  1321                         FACILITY:  [facility]   PHYSICIAN:  Marlowe Kays, M.D.  DATE OF BIRTH:  1932-10-24   DATE OF PROCEDURE:  10/30/2004  DATE OF DISCHARGE:                                 OPERATIVE REPORT   PREOPERATIVE DIAGNOSES:  1.  Degenerative arthritis, acromioclavicular joint.  2.  Chronic impingement syndrome with rotator cuff tendinopathy.  3.  Possible labral tear, right shoulder.   POSTOPERATIVE DIAGNOSES:  1.  Degenerative arthritis, acromioclavicular joint.  2.  Chronic impingement syndrome with rotator cuff tendinopathy.  3.  Labral and partial rotator cuff tears, right shoulder.   OPERATION:  1.  Right shoulder arthroscopy with debridement of articular surface of      rotator cuff and debridement of small labral tear.  2.  Arthroscopic subacromial decompression.  3.  Open resection distal clavicle/.   SURGEON:  Marlowe Kays, M.D.   FIRST ASSISTANT:  Dooley L. Idolina Primer, P.A.-C.   PA-C.   ANESTHESIA:  General; see anesthesia.   PROCEDURE:  Chronic pain right shoulder with MRI demonstrating diagnoses 1,  2 and 3.  See operative description below for additional details.   DESCRIPTION OF PROCEDURE:  Prophylactic antibiotic, satisfactory general  anesthesia. Following interscalene block, he was placed on a Schlein frame.  Right shoulder was prepped with DuraPrep and draped in a sterile field.  It  was marked out, including the distal clavicle excision site and posterior  and lateral portals as well as the subacromial space were all infiltrated  with 1/2% Marcaine with adrenaline mainly for hemostasis since it had a  scalene block.  The posterior soft spot portal to atraumatically enter the  glenohumeral joint.  On inspection he had a balled up area labrum  posteriorly and a good bit of fraying of the undersurface of  the rotator  cuff.  The remainder of the shoulder joint was rather unremarkable.  Accordingly, I advanced the scope between the biceps and the subscapularis  and using switching stick made an anterior incision.  Over this I placed a  metal cannula followed by 4.2 shaver and debrided the undersurface of the  rotator cuff and the area of labral disruption.  We evacuated all fluid  possible from the joint and redirected the scope in the subacromial area.  Through a lateral portal I used a metal cannula followed by a 4.2 shaver.  He had a good bit of bursal surface wear on the rotator cuff as well.  I  debrided it as well as the subacromial space with the 4.2 shaver and then  used the 90 degree ArthroCare vaporizer to remove soft tissue on the  undersurface of acromion working around the leading edge ere from the edge  and excising a large coracoacromial ligament.  The distal clavicle was  impinging on the rotator cuff was easily visualized and was documented.  I  then used a 4-0 oval bur thoroughly decompressing the subacromial space with  documentary pictures taken with his arm to his side and arm  abducted.  I  then evacuated all fluid from subacromial space and made an open incision on  the distal clavicle, isolating it and identifying the [facility] joint.  I measured  medially 1.5 cm and marked the clavicle at this point.  I then undermined  the clavicle cautiously with a key elevator and then placed two baby  Bennett's and used a microsaw to amputate through the clavicle at this  point. We grasped the cut clavicle with a towel clip and excised it with  Bovie dissection.  I then checked and there was no more bone that needed  removal.  The bone  edge was covered with bone wax and irrigated and the  resection site packed with Gelfoam.  I then closed the fascia overlying it  with a interrupted #1 Vicryl and subcutaneous tissue with 2-0 Vicryl and the  skin with the Steri-Strips.  The three portal  closed with 4-0 nylon. Adaptic  was placed over them.  A dry sterile dressing and short immobilizer applied.  He tolerated the procedure well and was taken to recovery in satisfactory  condition with no known complications.       JA/MEDQ  D:  10/30/2004  T:  10/31/2004  Job:  [id number]

## 2010-09-30 ENCOUNTER — Other Ambulatory Visit: Payer: Medicare Other

## 2010-09-30 ENCOUNTER — Other Ambulatory Visit: Payer: Self-pay | Admitting: Internal Medicine

## 2010-09-30 ENCOUNTER — Encounter: Payer: Self-pay | Admitting: *Deleted

## 2010-09-30 DIAGNOSIS — Z1211 Encounter for screening for malignant neoplasm of colon: Secondary | ICD-10-CM

## 2010-09-30 LAB — FECAL OCCULT BLOOD, IMMUNOCHEMICAL: Fecal Occult Bld: NEGATIVE

## 2010-10-03 ENCOUNTER — Encounter: Payer: Self-pay | Admitting: *Deleted

## 2010-11-05 ENCOUNTER — Ambulatory Visit (INDEPENDENT_AMBULATORY_CARE_PROVIDER_SITE_OTHER): Payer: Medicare Other | Admitting: Internal Medicine

## 2010-11-05 ENCOUNTER — Encounter: Payer: Self-pay | Admitting: Internal Medicine

## 2010-11-05 VITALS — BP 128/76 | HR 67 | Temp 98.4°F | Resp 14 | Wt 190.5 lb

## 2010-11-05 DIAGNOSIS — E785 Hyperlipidemia, unspecified: Secondary | ICD-10-CM

## 2010-11-05 DIAGNOSIS — R7303 Prediabetes: Secondary | ICD-10-CM

## 2010-11-05 DIAGNOSIS — R7309 Other abnormal glucose: Secondary | ICD-10-CM

## 2010-11-05 DIAGNOSIS — J449 Chronic obstructive pulmonary disease, unspecified: Secondary | ICD-10-CM

## 2010-11-05 LAB — CBC WITH DIFFERENTIAL/PLATELET
Basophils Absolute: 0.1 10*3/uL (ref 0.0–0.1)
Hemoglobin: 13.7 g/dL (ref 13.0–17.0)
Lymphocytes Relative: 40.8 % (ref 12.0–46.0)
Lymphs Abs: 3.9 10*3/uL (ref 0.7–4.0)
Monocytes Absolute: 0.8 10*3/uL (ref 0.1–1.0)

## 2010-11-05 LAB — HEMOGLOBIN A1C: Hgb A1c MFr Bld: 6 % (ref 4.6–6.5)

## 2010-11-05 NOTE — Progress Notes (Signed)
  Subjective:    Patient ID: Joshua Bullock, male    DOB: [birth year], 77 y.o.   MRN: [id number]  HPI Routine office visit, feeling well, good medication compliance with all meds without apparent side effects  Past Medical History: Hyperlipidemia Osteopenia per DEXA 07-2006, declined medicines borderline DM ---> A1C 5.9  04-2008 Allergic Rhinitis  COPD; PFTs 04-2010 mild to moderate dz, normal volumes  Past Surgical History: Tonsillectomy and adenoidectomy   Review of Systems Denies any cough, shortness of breath or wheezing. No sputum production. No chest pain, lower extremity edema     Objective:   Physical Exam  Constitutional: He is oriented to person, place, and time. He appears well-developed and well-nourished.  Cardiovascular: Normal rate, regular rhythm and normal heart sounds.   No murmur heard. Pulmonary/Chest:       Few dry crackles at bases, not a new finding, otherwise lungs normal.  Musculoskeletal: He exhibits no edema.  Neurological: He is alert and oriented to person, place, and time.          Assessment & Plan:  DIABETES Chart and pertinent labs reviewed Labs   COPD  Per PFTs 2-12, lst CXR 8-11, he is a former smoker , asx  Plan: observation  HYPERLIPIDEMIA pertinent labs reviewed No change

## 2010-11-06 ENCOUNTER — Telehealth: Payer: Self-pay | Admitting: *Deleted

## 2010-11-06 NOTE — Telephone Encounter (Signed)
Patient Informed

## 2010-11-06 NOTE — Telephone Encounter (Signed)
Message copied by Regis Bill on Wed Nov 06, 2010  4:53 PM ------      Message from: Willow Ora E      Created: Wed Nov 06, 2010  4:07 PM       Advise patient: A1c stable. Good results

## 2011-05-09 ENCOUNTER — Ambulatory Visit (INDEPENDENT_AMBULATORY_CARE_PROVIDER_SITE_OTHER): Payer: Medicare Other | Admitting: Internal Medicine

## 2011-05-09 VITALS — BP 140/84 | HR 68 | Temp 97.6°F | Ht 71.5 in | Wt 192.0 lb

## 2011-05-09 DIAGNOSIS — M949 Disorder of cartilage, unspecified: Secondary | ICD-10-CM

## 2011-05-09 DIAGNOSIS — Z129 Encounter for screening for malignant neoplasm, site unspecified: Secondary | ICD-10-CM

## 2011-05-09 DIAGNOSIS — Z0000 Encounter for general adult medical examination without abnormal findings: Secondary | ICD-10-CM

## 2011-05-09 DIAGNOSIS — R7309 Other abnormal glucose: Secondary | ICD-10-CM

## 2011-05-09 DIAGNOSIS — J449 Chronic obstructive pulmonary disease, unspecified: Secondary | ICD-10-CM

## 2011-05-09 DIAGNOSIS — E785 Hyperlipidemia, unspecified: Secondary | ICD-10-CM

## 2011-05-09 DIAGNOSIS — M899 Disorder of bone, unspecified: Secondary | ICD-10-CM

## 2011-05-09 DIAGNOSIS — Z125 Encounter for screening for malignant neoplasm of prostate: Secondary | ICD-10-CM

## 2011-05-09 HISTORY — DX: Chronic obstructive pulmonary disease, unspecified: J44.9

## 2011-05-09 LAB — LIPID PANEL
HDL: 31.9 mg/dL — ABNORMAL LOW (ref >39.00)
LDL Cholesterol: 69 mg/dL (ref 0–99)
Total CHOL/HDL Ratio: 4

## 2011-05-09 LAB — COMPREHENSIVE METABOLIC PANEL
ALT: 26 U/L (ref 0–53)
BUN: 13 mg/dL (ref 6–23)
Calcium: 9.2 mg/dL (ref 8.4–10.5)
Chloride: 103 mEq/L (ref 96–112)
Potassium: 4.3 mEq/L (ref 3.5–5.1)
Sodium: 138 mEq/L (ref 135–145)

## 2011-05-09 LAB — PSA: PSA: 1.52 ng/mL (ref 0.10–4.00)

## 2011-05-09 NOTE — Assessment & Plan Note (Addendum)
Td-- allergic influenza shot-- allergic pneumonia shot--declined Shingles immunization discussed before  He is a very active 78 y/o gent, cont with screenings , ordering a PSA (waiver may be needed) Reports remote Cscopes, previous hemocults-iFOBs negative Request referral to Dr Katharine Look  encouraged to continue w/ healthy life style [employer] form completed  C/O what seem to be hot flashes, discussed possible labs, will consider to check  testosterone in the future

## 2011-05-09 NOTE — Assessment & Plan Note (Signed)
Former smoker, fine crackles at bases on exam. PFTs 2012 documented mild-moderate dz  Plan: observation, doing well

## 2011-05-09 NOTE — Assessment & Plan Note (Signed)
Good med compliance , labs  

## 2011-05-09 NOTE — Assessment & Plan Note (Addendum)
Per DEXA 2008 On Ca and vit D, very active. Offered repeated DEXA, declined, he was not interested on meds in the past and still is not interested

## 2011-05-09 NOTE — Assessment & Plan Note (Signed)
Very healthy life style, labs

## 2011-05-09 NOTE — Progress Notes (Signed)
  Subjective:    Patient ID: Joshua Bullock, male    DOB: December 07, 1932, 78 y.o.   MRN: [id number]  HPI Here for Medicare AWV:  1. Risk factors based on Past M, S, F history: reviewed  2. Physical Activities: very active, walks 3/week,  takes hikes w/ the [employer]  3. Depression/mood: no problems  4. Hearing: decreased  hearing , referral done  5. ADL's: totally independent  6. Fall Risk: low risk, counseled  7. Home Safety: does feel safe  8. Height, weight, &visual acuity: see VS, vision corrected w/ glasses  9. Counseling: yes  10. Labs ordered based on risk factors: yes  11.           Referral Coordination, if needed  12.           Care Plan, see a/p  13.            Cognitive Assessment: cognition and memory seem appropiate for age   in addition, we discussed the following issues Needs a form for the [employer] Hot flashes-- feels hot at night sometimes, last 30 minutes, no sweats or associated sx , no recent cough or wt loss  Hyperlipidemia-- good medication compliance  Osteopenia per DEXA 07-2006, declined medicines but takes Ca and Vit D borderline DM --->diet very healthy, no ambulatory CBGs    Past Medical History:  Hyperlipidemia  Osteopenia per DEXA 07-2006, declined medicines  borderline DM Allergic Rhinitis  COPD; PFTs 04-2010 mild to moderate dz, normal volumes  H/o colitis in the 80s  Past Surgical History:  Tonsillectomy and adenoidectomy  Family History: breast Ca - M prostate Ca - no colon ca -- no HTN - F DM - no CAD - no stroke - no   Social History: Married, 1 child Former Smoker (quit after 51 years on 11/23/2007) Alcohol use-yes (occasionally) Regular exercise-yes (3 x/wk) [employer] x 26 years   Review of Systems  Respiratory: Negative for cough and shortness of breath.   Cardiovascular: Negative for chest pain, palpitations and leg swelling.  Gastrointestinal: Negative for abdominal pain and blood in stool.  Genitourinary: Negative for  dysuria and hematuria.       Objective:   Physical Exam  Constitutional: He is oriented to person, place, and time. He appears well-developed and well-nourished.  HENT:  Head: Normocephalic and atraumatic.  Neck: No thyromegaly present.  Cardiovascular: Normal rate, regular rhythm and normal heart sounds.   No murmur heard. Pulmonary/Chest:       Dry crackles at bases, otherwise wnl  Abdominal: Soft. He exhibits no distension. There is no tenderness. There is no rebound and no guarding.  Genitourinary: Rectum normal and prostate normal. Guaiac negative stool.       External hemorrhoids  Musculoskeletal: He exhibits no edema.  Neurological: He is alert and oriented to person, place, and time.  Psychiatric: He has a normal mood and affect. His behavior is normal. Judgment and thought content normal.       Assessment & Plan:

## 2011-05-10 ENCOUNTER — Encounter: Payer: Self-pay | Admitting: Internal Medicine

## 2011-05-10 LAB — VITAMIN D 25 HYDROXY (VIT D DEFICIENCY, FRACTURES): Vit D, 25-Hydroxy: 33 ng/mL (ref 30–89)

## 2011-05-12 ENCOUNTER — Encounter: Payer: Self-pay | Admitting: Gastroenterology

## 2011-05-12 ENCOUNTER — Encounter: Payer: Self-pay | Admitting: *Deleted

## 2011-05-13 ENCOUNTER — Encounter: Payer: Self-pay | Admitting: *Deleted

## 2011-05-14 ENCOUNTER — Encounter: Payer: Self-pay | Admitting: *Deleted

## 2011-05-30 ENCOUNTER — Ambulatory Visit (AMBULATORY_SURGERY_CENTER): Payer: Medicare Other | Admitting: *Deleted

## 2011-05-30 VITALS — Ht 71.0 in | Wt 192.0 lb

## 2011-05-30 DIAGNOSIS — Z1211 Encounter for screening for malignant neoplasm of colon: Secondary | ICD-10-CM

## 2011-05-30 MED ORDER — PEG-KCL-NACL-NASULF-NA ASC-C 100 G PO SOLR: ORAL | Status: DC

## 2011-05-30 NOTE — Progress Notes (Signed)
Pt. States he had a colon about 12 years ago but can't remember the doctor's name and it was here in [place].  Exam was normal

## 2011-06-04 ENCOUNTER — Other Ambulatory Visit: Payer: Self-pay | Admitting: Internal Medicine

## 2011-06-04 NOTE — Telephone Encounter (Signed)
Refill done.  

## 2011-06-13 ENCOUNTER — Encounter: Payer: Self-pay | Admitting: Gastroenterology

## 2011-06-13 ENCOUNTER — Ambulatory Visit (AMBULATORY_SURGERY_CENTER): Payer: Medicare Other | Admitting: Gastroenterology

## 2011-06-13 VITALS — BP 135/93 | HR 68 | Temp 95.3°F | Resp 20 | Ht 71.0 in | Wt 192.0 lb

## 2011-06-13 DIAGNOSIS — D126 Benign neoplasm of colon, unspecified: Secondary | ICD-10-CM

## 2011-06-13 DIAGNOSIS — K5730 Diverticulosis of large intestine without perforation or abscess without bleeding: Secondary | ICD-10-CM

## 2011-06-13 DIAGNOSIS — Z1211 Encounter for screening for malignant neoplasm of colon: Secondary | ICD-10-CM

## 2011-06-13 DIAGNOSIS — K648 Other hemorrhoids: Secondary | ICD-10-CM

## 2011-06-13 MED ORDER — SODIUM CHLORIDE 0.9 % IV SOLN: 500.0000 mL | INTRAVENOUS | Status: DC

## 2011-06-13 NOTE — Patient Instructions (Signed)
Discharge instructions given with verbal understanding. Handouts on polyps,diverticulosis and hemorrhoids given. Resume previous medications. YOU HAD AN ENDOSCOPIC PROCEDURE TODAY AT THE [facility]: Refer to the procedure report that was given to you for any specific questions about what was found during the examination.  If the procedure report does not answer your questions, please call your gastroenterologist to clarify.  If you requested that your care partner not be given the details of your procedure findings, then the procedure report has been included in a sealed envelope for you to review at your convenience later.  YOU SHOULD EXPECT: Some feelings of bloating in the abdomen. Passage of more gas than usual.  Walking can help get rid of the air that was put into your GI tract during the procedure and reduce the bloating. If you had a lower endoscopy (such as a colonoscopy or flexible sigmoidoscopy) you may notice spotting of blood in your stool or on the toilet paper. If you underwent a bowel prep for your procedure, then you may not have a normal bowel movement for a few days.  DIET: Your first meal following the procedure should be a light meal and then it is ok to progress to your normal diet.  A half-sandwich or bowl of soup is an example of a good first meal.  Heavy or fried foods are harder to digest and may make you feel nauseous or bloated.  Likewise meals heavy in dairy and vegetables can cause extra gas to form and this can also increase the bloating.  Drink plenty of fluids but you should avoid alcoholic beverages for 24 hours.  ACTIVITY: Your care partner should take you home directly after the procedure.  You should plan to take it easy, moving slowly for the rest of the day.  You can resume normal activity the day after the procedure however you should NOT DRIVE or use heavy machinery for 24 hours (because of the sedation medicines used during the test).    SYMPTOMS TO  REPORT IMMEDIATELY: A gastroenterologist can be reached at any hour.  During normal business hours, 8:30 AM to 5:00 PM Monday through Friday, call [phone].  After hours and on weekends, please call the GI answering service at [phone] who will take a message and have the physician on call contact you.   Following lower endoscopy (colonoscopy or flexible sigmoidoscopy):  Excessive amounts of blood in the stool  Significant tenderness or worsening of abdominal pains  Swelling of the abdomen that is new, acute  Fever of 100F or higher  FOLLOW UP: If any biopsies were taken you will be contacted by phone or by letter within the next 1-3 weeks.  Call your gastroenterologist if you have not heard about the biopsies in 3 weeks.  Our staff will call the home number listed on your records the next business day following your procedure to check on you and address any questions or concerns that you may have at that time regarding the information given to you following your procedure. This is a courtesy call and so if there is no answer at the home number and we have not heard from you through the emergency physician on call, we will assume that you have returned to your regular daily activities without incident.  SIGNATURES/CONFIDENTIALITY: You and/or your care partner have signed paperwork which will be entered into your electronic medical record.  These signatures attest to the fact that that the information above on your After Visit   Summary has been reviewed and is understood.  Full responsibility of the confidentiality of this discharge information lies with you and/or your care-partner.  

## 2011-06-13 NOTE — Progress Notes (Signed)
Patient did not experience any of the following events: a burn prior to discharge; a fall within the facility; wrong site/side/patient/procedure/implant event; or a hospital transfer or hospital admission upon discharge from the facility. (G8907) Patient did not have preoperative order for IV antibiotic SSI prophylaxis. (G8918)  

## 2011-06-13 NOTE — Progress Notes (Signed)
Propofol given per Paulita Cradle CRNA  O2 up to 4/L and titrated per Paulita Cradle CRNA

## 2011-06-13 NOTE — Op Note (Signed)
[facility] [address] [place], [place]  [address]  COLONOSCOPY PROCEDURE REPORT  PATIENT:  Joshua Bullock, Joshua Bullock  MR#:  [id number] BIRTHDATE:  [birth year], 78 yrs. old  GENDER:  male ENDOSCOPIST:  Barbette Hair. Arlyce Dice, MD REF. BY:  Willow Ora, M.D. PROCEDURE DATE:  06/13/2011 PROCEDURE:  Colonoscopy with snare polypectomy, Colon with cold biopsy polypectomy ASA CLASS:  Class II INDICATIONS:  Routine Risk Screening MEDICATIONS:   MAC sedation, administered by CRNA propofol 200mg IV  DESCRIPTION OF PROCEDURE:   After the risks benefits and alternatives of the procedure were thoroughly explained, informed consent was obtained.  Digital rectal exam was performed and revealed no abnormalities.   The LB [id number]-[id number] endoscope was introduced through the anus and advanced to the cecum, which was identified by both the appendix and ileocecal valve, without limitations.  The quality of the prep was excellent, using MoviPrep.  The instrument was then slowly withdrawn as the colon was fully examined. <<PROCEDUREIMAGES>>  FINDINGS:  A sessile polyp was found in the ascending colon. It was 3 mm in size. Flat polyp Polyp was snared without cautery. Retrieval was successful (see image7). snare polyp  A diminutive polyp was found in the descending colon. It was 2 mm in size. The polyp was removed using cold biopsy forceps (see image9). Scattered diverticula were found in the ascending colon (see image5).  Moderate diverticulosis was found in the sigmoid colon (see image10).  Internal Hemorrhoids were found (see image11). This was otherwise a normal examination of the colon (see image3 and image2).   Retroflexed views in the rectum revealed no abnormalities.    The time to cecum =  1) 3.50  minutes. The scope was then withdrawn in  1) 10.0  minutes from the cecum and the procedure completed. COMPLICATIONS:  None ENDOSCOPIC IMPRESSION: 1) 3 mm sessile polyp in the ascending colon 2)  2 mm diminutive polyp in the descending colon 3) Diverticula, scattered in the ascending colon 4) Moderate diverticulosis in the sigmoid colon 5) Internal hemorrhoids 6) Otherwise normal examination RECOMMENDATIONS: 1) If the polyp(s) removed today are proven to be adenomatous (pre-cancerous) polyps, you will need a repeat colonoscopy in 5 years. Otherwise you should continue to follow colorectal cancer screening guidelines for "routine risk" patients with colonoscopy in 10 years. You will receive a letter within 1-2 weeks with the results of your biopsy as well as final recommendations. Please call my office if you have not received a letter after 3 weeks. REPEAT EXAM:  You will receive a letter from Dr. Arlyce Dice in 1-2 weeks, after reviewing the final pathology, with followup recommendations.  ______________________________ Barbette Hair Arlyce Dice, MD  CC:  n. eSIGNED:   Barbette Hair. Torianne Laflam at 06/13/2011 11:24 AM  Joelyn Oms, [phone]

## 2011-06-16 ENCOUNTER — Telehealth: Payer: Self-pay | Admitting: *Deleted

## 2011-06-16 NOTE — Telephone Encounter (Signed)
  Follow up Call-  Call back number 06/13/2011  Post procedure Call Back phone  # [phone]  Permission to leave phone message Yes     Patient questions:  Do you have a fever, pain , or abdominal swelling? no Pain Score  0 *  Have you tolerated food without any problems? yes  Have you been able to return to your normal activities? yes  Do you have any questions about your discharge instructions: Diet   no Medications  no Follow up visit  no  Do you have questions or concerns about your Care? no  Actions: * If pain score is 4 or above: No action needed, pain <4.

## 2011-06-18 ENCOUNTER — Encounter: Payer: Self-pay | Admitting: Gastroenterology

## 2011-06-19 ENCOUNTER — Other Ambulatory Visit: Payer: Self-pay | Admitting: *Deleted

## 2011-06-19 DIAGNOSIS — Z0000 Encounter for general adult medical examination without abnormal findings: Secondary | ICD-10-CM

## 2011-11-07 ENCOUNTER — Ambulatory Visit: Payer: Medicare Other | Admitting: Internal Medicine

## 2011-11-20 ENCOUNTER — Encounter: Payer: Self-pay | Admitting: Internal Medicine

## 2011-11-20 ENCOUNTER — Ambulatory Visit (INDEPENDENT_AMBULATORY_CARE_PROVIDER_SITE_OTHER): Payer: Medicare Other | Admitting: Internal Medicine

## 2011-11-20 VITALS — BP 142/86 | HR 58 | Temp 97.8°F | Wt 193.0 lb

## 2011-11-20 DIAGNOSIS — J449 Chronic obstructive pulmonary disease, unspecified: Secondary | ICD-10-CM

## 2011-11-20 DIAGNOSIS — E785 Hyperlipidemia, unspecified: Secondary | ICD-10-CM

## 2011-11-20 DIAGNOSIS — H3530 Unspecified macular degeneration: Secondary | ICD-10-CM | POA: Insufficient documentation

## 2011-11-20 DIAGNOSIS — J309 Allergic rhinitis, unspecified: Secondary | ICD-10-CM

## 2011-11-20 LAB — LIPID PANEL
HDL: 33.3 mg/dL — ABNORMAL LOW (ref >39.00)
Triglycerides: 161 mg/dL — ABNORMAL HIGH (ref 0.0–149.0)
VLDL: 32.2 mg/dL (ref 0.0–40.0)

## 2011-11-20 LAB — ALT: ALT: 21 U/L (ref 0–53)

## 2011-11-20 MED ORDER — AZELASTINE HCL 0.1 % NA SOLN: 2.0000 | Freq: Two times a day (BID) | NASAL | Status: DC

## 2011-11-20 NOTE — Patient Instructions (Addendum)
Next visit by February 2014 for your yearly checkup, fasting

## 2011-11-20 NOTE — Progress Notes (Signed)
  Subjective:    Patient ID: Joshua Bullock, male    DOB: 1932-04-04, 78 y.o.   MRN: [id number]  HPI Routine office visit High cholesterol, good compliance with medication, that has actually improved. Since the last time he was here, he was diagnosed with mild macular degeneration, taking preservision.  Past Medical History:   Hyperlipidemia   Osteopenia per DEXA 07-2006, declined medicines   borderline DM Allergic Rhinitis   COPD; PFTs 04-2010 mild to moderate dz, normal volumes   H/o colitis in the 80s  Past Surgical History:   Tonsillectomy and adenoidectomy  Family History: breast Ca - M prostate Ca - no colon ca -- no HTN - F DM - no CAD - no stroke - no   Social History: Married, 1 child Former Smoker (quit after 51 years on 11/23/2007) Alcohol use-yes (occasionally) Regular exercise-yes (3 x/wk) [employer] x 26 years     Review of Systems Admits to some cough but no chest congestion or wheezing. Most of the cough is related to postnasal dripping. He is taking Sudafed over-the-counter, Afrin nasal spray and also over-the-counter antihistaminics with only mild relief. Other than that he feels well.     Objective:   Physical Exam  General -- alert, well-developed, and overweight appearing. No apparent distress.  Lungs --  Mild crackles at bases otherwise normal  Heart-- normal rate, regular rhythm, no murmur, and no gallop.   Extremities-- no pretibial edema bilaterally  Neurologic-- alert & oriented X3 and strength normal in all extremities. Psych-- Cognition and judgment appear intact. Alert and cooperative with normal attention span and concentration.  not anxious appearing and not depressed appearing.       Assessment & Plan:

## 2011-11-20 NOTE — Assessment & Plan Note (Signed)
Good compliance with medication, diet has improved, last triglycerides were slightly elevated, we will check FLP AST ALT

## 2011-11-20 NOTE — Assessment & Plan Note (Signed)
Mild cough, patient thinks related to postnasal dripping. Will treat with a nose spray. Unable to take flu shot due to allergies.

## 2011-11-20 NOTE — Assessment & Plan Note (Signed)
Complain of ongoing runny nose, recommend to discontinue OTC Afrin, Sudafed. Trial with Astelin.

## 2011-11-25 ENCOUNTER — Encounter: Payer: Self-pay | Admitting: *Deleted

## 2012-05-10 ENCOUNTER — Encounter: Payer: Self-pay | Admitting: Internal Medicine

## 2012-05-10 ENCOUNTER — Ambulatory Visit (INDEPENDENT_AMBULATORY_CARE_PROVIDER_SITE_OTHER): Payer: Medicare Other | Admitting: Internal Medicine

## 2012-05-10 VITALS — BP 142/84 | HR 62 | Temp 98.2°F | Ht 71.0 in | Wt 194.0 lb

## 2012-05-10 DIAGNOSIS — J4489 Other specified chronic obstructive pulmonary disease: Secondary | ICD-10-CM

## 2012-05-10 DIAGNOSIS — E291 Testicular hypofunction: Secondary | ICD-10-CM

## 2012-05-10 DIAGNOSIS — R7989 Other specified abnormal findings of blood chemistry: Secondary | ICD-10-CM

## 2012-05-10 DIAGNOSIS — M949 Disorder of cartilage, unspecified: Secondary | ICD-10-CM

## 2012-05-10 DIAGNOSIS — J449 Chronic obstructive pulmonary disease, unspecified: Secondary | ICD-10-CM

## 2012-05-10 DIAGNOSIS — E785 Hyperlipidemia, unspecified: Secondary | ICD-10-CM

## 2012-05-10 DIAGNOSIS — Z0000 Encounter for general adult medical examination without abnormal findings: Secondary | ICD-10-CM

## 2012-05-10 DIAGNOSIS — M899 Disorder of bone, unspecified: Secondary | ICD-10-CM

## 2012-05-10 DIAGNOSIS — R7309 Other abnormal glucose: Secondary | ICD-10-CM

## 2012-05-10 DIAGNOSIS — H9190 Unspecified hearing loss, unspecified ear: Secondary | ICD-10-CM

## 2012-05-10 LAB — CBC WITH DIFFERENTIAL/PLATELET
Basophils Relative: 0.7 % (ref 0.0–3.0)
Eosinophils Relative: 3 % (ref 0.0–5.0)
Hemoglobin: 13.6 g/dL (ref 13.0–17.0)
Lymphocytes Relative: 32.7 % (ref 12.0–46.0)
MCHC: 33.7 g/dL (ref 30.0–36.0)
Monocytes Relative: 8 % (ref 3.0–12.0)
Neutro Abs: 6.2 10*3/uL (ref 1.4–7.7)
Neutrophils Relative %: 55.6 % (ref 43.0–77.0)
RBC: 4.26 Mil/uL (ref 4.22–5.81)
WBC: 11.1 10*3/uL — ABNORMAL HIGH (ref 4.5–10.5)

## 2012-05-10 LAB — LIPID PANEL
Cholesterol: 121 mg/dL (ref 0–200)
LDL Cholesterol: 55 mg/dL (ref 0–99)
Triglycerides: 186 mg/dL — ABNORMAL HIGH (ref 0.0–149.0)

## 2012-05-10 LAB — COMPREHENSIVE METABOLIC PANEL
AST: 37 U/L (ref 0–37)
BUN: 16 mg/dL (ref 6–23)
Calcium: 9.3 mg/dL (ref 8.4–10.5)
Chloride: 102 mEq/L (ref 96–112)
Creatinine, Ser: 1.1 mg/dL (ref 0.4–1.5)
GFR: 71.85 mL/min (ref >60.00)
Glucose, Bld: 107 mg/dL — ABNORMAL HIGH (ref 70–99)

## 2012-05-10 NOTE — Assessment & Plan Note (Addendum)
Td-- allergic influenza shot-- allergic pneumonia shot, zostavax --declined again, benefits discussed   DRE normal, PSAs consistently wnl last  Cscopes 3-13, 2 polyps, next 5 years per chart review  encouraged to continue w/ healthy life style [employer] form completed

## 2012-05-10 NOTE — Assessment & Plan Note (Signed)
Arrange an audiologist referral

## 2012-05-10 NOTE — Assessment & Plan Note (Addendum)
History of COPD, essentially asymptomatic, he is able to go camping and reportedly walks 45 miles on weekends w/o CP-SOB

## 2012-05-10 NOTE — Assessment & Plan Note (Signed)
Good compliance, labs  

## 2012-05-10 NOTE — Patient Instructions (Signed)

## 2012-05-10 NOTE — Assessment & Plan Note (Signed)
Due for labs , RTC 6 months

## 2012-05-10 NOTE — Progress Notes (Signed)
  Subjective:    Patient ID: Joshua Bullock, male    DOB: 03/30/1932, 79 y.o.   MRN: [id number]  HPI  Here for Medicare AWV:  1.Risk factors based on Past M, S, F history: reviewed   2. Physical Activities: very active, walks 3/week,  takes hikes w/ the [employer]   3. Depression/mood: (-) screening   4. Hearing: decreased  hearing , didn't go to see the audiologist last year----> will arrange   5. ADL's: totally independent   6.  Fall Risk: low risk, see instructions  7.  Home Safety: does feel safe   8.   Height, weight, &visual acuity: see VS, vision corrected w/ glasses, sees eye doctor regularly   9.  Counseling: yes   10. Labs ordered based on risk factors: yes   11. Referral Coordination, if needed   12. Care Plan, see a/p   13. Cognitive Assessment: cognition and memory seem appropiate for age   in addition, we discussed the following issues High cholesterol, good compliance with medications Allergies, well-controlled with nasal sprays. Recently he has some early changes of degeneration in the eye, followup by ophthalmology. Osteopenia, on calcium and vitamin D.   Past Medical History:  Hyperlipidemia  Osteopenia per DEXA 07-2006, declined medicines  borderline DM  Allergic Rhinitis  COPD; PFTs 04-2010 mild to moderate dz, normal volumes  H/o colitis in the 80s   Past Surgical History:  Tonsillectomy and adenoidectomy   Family History:  breast Ca - M  prostate Ca - no  colon ca -- no  HTN - F  DM - no  CAD - no  stroke - no  Social History:  Married x 39 years, 1 child  Former Smoker (quit after 51 years on 11/23/2007)  Alcohol use-yes (occasionally)  [employer] x > 26 years    Review of Systems Denies chest pain or shortness of breath, very active. No nausea, vomiting, diarrhea or blood in the stools. No dysuria or difficulty urinating.     Objective:   Physical Exam  General -- alert, well-developed, BMI 27.   Neck --no thyromegaly , normal carotid  pulse Lungs -- normal respiratory effort, no intercostal retractions, no accessory muscle use, and dry crackles at bases Heart-- normal rate, regular rhythm, no murmur, and no gallop.   Abdomen--soft, non-tender, no distention, no masses, no HSM, no guarding, and no rigidity.   Extremities-- no pretibial edema bilaterally, normal femoral pulses  Rectal-- + external skin tagsnoted. Normal sphincter tone. No rectal masses or tenderness. Brown stool  Prostate:  Prostate gland firm and smooth, no enlargement, nodularity, tenderness, mass, asymmetry or induration. Neurologic-- alert & oriented X3 and strength normal in all extremities. Psych-- Cognition and judgment appear intact. Alert and cooperative with normal attention span and concentration.  not anxious appearing and not depressed appearing.       Assessment & Plan:

## 2012-05-10 NOTE — Assessment & Plan Note (Addendum)
On ca and vit D. He agreed to redo DEXA

## 2012-05-11 LAB — TESTOSTERONE, FREE, TOTAL, SHBG
Testosterone, Free: 6.1 pg/mL — ABNORMAL LOW (ref 47.0–244.0)
Testosterone-% Free: 1.6 % (ref 1.6–2.9)

## 2012-05-13 NOTE — Addendum Note (Signed)
Addended by: Edwena Felty T on: 05/13/2012 02:44 PM   Modules accepted: Orders

## 2012-05-14 ENCOUNTER — Other Ambulatory Visit (INDEPENDENT_AMBULATORY_CARE_PROVIDER_SITE_OTHER): Payer: Medicare Other

## 2012-05-14 DIAGNOSIS — E291 Testicular hypofunction: Secondary | ICD-10-CM

## 2012-05-14 LAB — LUTEINIZING HORMONE: LH: 1.53 m[IU]/mL — ABNORMAL LOW (ref 3.10–34.60)

## 2012-05-14 LAB — FOLLICLE STIMULATING HORMONE: FSH: 6.8 m[IU]/mL (ref 1.4–18.1)

## 2012-05-14 LAB — PROLACTIN: Prolactin: 6.8 ng/mL (ref 2.1–17.1)

## 2012-05-20 NOTE — Addendum Note (Signed)
Addended by: Edwena Felty T on: 05/20/2012 02:00 PM   Modules accepted: Orders

## 2012-05-21 ENCOUNTER — Ambulatory Visit (INDEPENDENT_AMBULATORY_CARE_PROVIDER_SITE_OTHER)
Admission: RE | Admit: 2012-05-21 | Discharge: 2012-05-21 | Disposition: A | Discharge status: Home / Self Care | Payer: Medicare Other | Source: Ambulatory Visit | Attending: Internal Medicine | Admitting: Internal Medicine

## 2012-05-21 DIAGNOSIS — M949 Disorder of cartilage, unspecified: Secondary | ICD-10-CM

## 2012-05-26 ENCOUNTER — Other Ambulatory Visit: Payer: Self-pay | Admitting: Internal Medicine

## 2012-05-26 NOTE — Telephone Encounter (Signed)
Refill done.  

## 2012-06-01 ENCOUNTER — Telehealth: Payer: Self-pay | Admitting: *Deleted

## 2012-06-01 NOTE — Telephone Encounter (Signed)
Phoned patient to make aware of bone density results. Advised to stay active and increase Ca+ and Vit D intake. Advised to consider meds such a fosamax particularly if endo does not start him on testosterone. Patient verbalized understanding.

## 2012-06-03 ENCOUNTER — Ambulatory Visit: Payer: Medicare Other | Admitting: Endocrinology

## 2012-06-04 ENCOUNTER — Encounter: Payer: Self-pay | Admitting: Endocrinology

## 2012-06-04 ENCOUNTER — Ambulatory Visit (INDEPENDENT_AMBULATORY_CARE_PROVIDER_SITE_OTHER): Payer: Medicare Other | Admitting: Endocrinology

## 2012-06-04 VITALS — BP 130/68 | HR 73 | Wt 195.0 lb

## 2012-06-04 DIAGNOSIS — E230 Hypopituitarism: Secondary | ICD-10-CM

## 2012-06-04 NOTE — Patient Instructions (Addendum)
Let's check an MRI of the pituitary.  you will receive a phone call, about a day and time for an appointment. If there is a benign tumor, i would refer you to a specialist. If not, i'll prescribe you a medication to help the testosterone.

## 2012-06-04 NOTE — Progress Notes (Signed)
Subjective:    Patient ID: Joshua Bullock, male    DOB: 12-10-1932, 79 y.o.   MRN: [id number]  HPI Pt reports he entered puberty at a normal age.  He has 1 biological child (now age 96), which was born after a neg w/u of infertility.  He has never been rx'ed for hypogonadism.  He has never taken illicit androgens.  He reports 1 year of moderate "hot flashes," worst on the trunk of the body, but no assoc muscle weakness.   Past Medical History  Diagnosis Date  . Hyperlipidemia   . Arthritis     Past Surgical History  Procedure Laterality Date  . Tonsillectomy  1955    History   Social History  . Marital Status: Married    Spouse Name: N/A    Number of Children: N/A  . Years of Education: N/A   Occupational History  . Not on file.   Social History Main Topics  . Smoking status: Former Games developer  . Smokeless tobacco: Never Used  . Alcohol Use: Yes     Comment: 1-2 a month wine or beer  . Drug Use: No  . Sexually Active: Not on file   Other Topics Concern  . Not on file   Social History Narrative  . No narrative on file    Current Outpatient Prescriptions on File Prior to Visit  Medication Sig Dispense Refill  . aspirin 81 MG tablet Take 81 mg by mouth daily.        Marland Kitchen azelastine (ASTELIN) 137 MCG/SPRAY nasal spray Place 2 sprays into the nose 2 (two) times daily. Use in each nostril as directed  30 mL  6  . CALCIUM-VITAMIN D PO Take 1 each by mouth daily.        . Multiple Vitamins-Minerals (EYE VITAMINS PO) Take 1 tablet by mouth daily.      . Multiple Vitamins-Minerals (MULTIVITAMIN,TX-MINERALS) tablet Take 1 tablet by mouth daily.        . pravastatin (PRAVACHOL) 40 MG tablet TAKE 1 TABLET EVERY DAY  90 tablet  2   No current facility-administered medications on file prior to visit.    Allergies  Allergen Reactions  . Influenza Virus Vacc Split Pf Other (See Comments)    Hospitalized with temperatures above 103 and classic symptoms of flu  . Tetanus Toxoid  Hives    Family History  Problem Relation Age of Onset  . Arthritis Father   . Cancer Father   . Hypertension Father   . Arthritis Sister   . Cancer Sister   . Hypertension Sister   neg for hypogonadism  BP 130/68  Pulse 73  Wt 195 lb (88.451 kg)  BMI 27.21 kg/m2  SpO2 95%    Review of Systems denies polyuria, cold intolerance, syncope, rash, depression, headache, visual loss, galactorrhea, weight change, easy bruising, change in facial appearance, and n/v.   He has ED sxs and rhinorrhea.    Objective:   Physical Exam VS: see vs page GEN: no distress HEAD: head: no deformity eyes: no periorbital swelling, no proptosis external nose and ears are normal mouth: no lesion seen NECK: supple, thyroid is not enlarged CHEST WALL: no deformity LUNGS: clear to auscultation, except for a few rales at the bases. BREASTS:  No gynecomastia CV: reg rate and rhythm, no murmur ABD: abdomen is soft, nontender.  no hepatosplenomegaly.  not distended.  no hernia GENITALIA:  Normal male, except testes are small and soft.   MUSCULOSKELETAL: muscle bulk  and strength are grossly normal.  no obvious joint swelling.  gait is normal and steady.   EXTEMITIES: no deformity.  no edema PULSES: dorsalis pedis intact bilat.  no carotid bruit. NEURO:  cn 2-12 grossly intact.   readily moves all 4's.  sensation is intact to touch on all 4's. SKIN:  Normal texture and temperature.  No rash or suspicious lesion is visible.   NODES:  None palpable at the neck PSYCH: alert, oriented x3.  Does not appear anxious nor depressed.  Lab Results  Component Value Date   TESTOSTERONE 37* 05/10/2012   (i reviewed DEXA results)    Assessment & Plan:  Hypogonadism: secondary, uncertain etiology.  Due to the severity of this, there is a high risk of a pituitary lesion. Osteopenia, prob due to the hypogonadism ED.  Uncertain of the is due to hypogonadism, as this is a common symptom at this age. Hot flashes,  prob due to hypogonadism

## 2012-06-09 ENCOUNTER — Ambulatory Visit
Admission: RE | Admit: 2012-06-09 | Discharge: 2012-06-09 | Disposition: A | Discharge status: Home / Self Care | Payer: Medicare Other | Source: Ambulatory Visit | Attending: Endocrinology | Admitting: Endocrinology

## 2012-06-09 ENCOUNTER — Other Ambulatory Visit: Payer: Self-pay | Admitting: Endocrinology

## 2012-06-09 MED ORDER — CLOMIPHENE CITRATE 50 MG PO TABS: ORAL_TABLET | ORAL | Status: DC

## 2012-06-09 MED ORDER — GADOBENATE DIMEGLUMINE 529 MG/ML IV SOLN
10.0000 mL | Freq: Once | INTRAVENOUS | Status: AC | PRN
  Administered 2012-06-09: 10 mL via INTRAVENOUS

## 2012-06-11 ENCOUNTER — Telehealth: Payer: Self-pay | Admitting: Endocrinology

## 2012-06-11 NOTE — Telephone Encounter (Signed)
Pt advised that rx for Clomid was sent in to the pharmacy

## 2012-06-11 NOTE — Telephone Encounter (Signed)
Returning a call to our office, he believes someone may have called to give results. Please call back @ [phone] / Sherri S.

## 2012-06-14 ENCOUNTER — Ambulatory Visit: Payer: Medicare Other | Attending: Internal Medicine | Admitting: Audiology

## 2012-06-14 DIAGNOSIS — H906 Mixed conductive and sensorineural hearing loss, bilateral: Secondary | ICD-10-CM | POA: Insufficient documentation

## 2012-06-14 DIAGNOSIS — H93299 Other abnormal auditory perceptions, unspecified ear: Secondary | ICD-10-CM | POA: Insufficient documentation

## 2012-09-01 ENCOUNTER — Ambulatory Visit (INDEPENDENT_AMBULATORY_CARE_PROVIDER_SITE_OTHER): Payer: Medicare Other | Admitting: Endocrinology

## 2012-09-01 ENCOUNTER — Encounter: Payer: Self-pay | Admitting: Endocrinology

## 2012-09-01 VITALS — BP 132/74 | HR 73 | Ht 71.0 in | Wt 198.0 lb

## 2012-09-01 DIAGNOSIS — E291 Testicular hypofunction: Secondary | ICD-10-CM

## 2012-09-01 DIAGNOSIS — Z125 Encounter for screening for malignant neoplasm of prostate: Secondary | ICD-10-CM | POA: Insufficient documentation

## 2012-09-01 HISTORY — DX: Testicular hypofunction: E29.1

## 2012-09-01 NOTE — Progress Notes (Signed)
  Subjective:    Patient ID: Joshua Bullock, male    DOB: September 28, 1932, 79 y.o.   MRN: [id number]  HPI Pt returns for f/u of idiopathic central hypogonadism (dx'ed 2013).  He has 1 biological child (now age 94), which was born after a neg w/u of infertility.  He has never been rx'ed for hypogonadism.  He has never taken illicit androgens.  Since on the clomid, he has had mild "hot flashes."   He has no swelling of the legs, or assoc sob.  Past Medical History  Diagnosis Date  . Hyperlipidemia   . Arthritis     Past Surgical History  Procedure Laterality Date  . Tonsillectomy  1955    History   Social History  . Marital Status: Married    Spouse Name: N/A    Number of Children: N/A  . Years of Education: N/A   Occupational History  . Not on file.   Social History Main Topics  . Smoking status: Former Games developer  . Smokeless tobacco: Never Used  . Alcohol Use: Yes     Comment: 1-2 a month wine or beer  . Drug Use: No  . Sexually Active: Not on file   Other Topics Concern  . Not on file   Social History Narrative  . No narrative on file    Current Outpatient Prescriptions on File Prior to Visit  Medication Sig Dispense Refill  . aspirin 81 MG tablet Take 81 mg by mouth daily.        Marland Kitchen azelastine (ASTELIN) 137 MCG/SPRAY nasal spray Place 2 sprays into the nose 2 (two) times daily. Use in each nostril as directed  30 mL  6  . CALCIUM-VITAMIN D PO Take 1 each by mouth daily.        . clomiPHENE (CLOMID) 50 MG tablet 1/4 tab daily  10 tablet  5  . Multiple Vitamins-Minerals (EYE VITAMINS PO) Take 1 tablet by mouth daily.      . Multiple Vitamins-Minerals (MULTIVITAMIN,TX-MINERALS) tablet Take 1 tablet by mouth daily.        . pravastatin (PRAVACHOL) 40 MG tablet TAKE 1 TABLET EVERY DAY  90 tablet  2   No current facility-administered medications on file prior to visit.    Allergies  Allergen Reactions  . Influenza Virus Vacc Split Pf Other (See Comments)   Hospitalized with temperatures above 103 and classic symptoms of flu  . Tetanus Toxoid Hives    Family History  Problem Relation Age of Onset  . Arthritis Father   . Cancer Father   . Hypertension Father   . Arthritis Sister   . Cancer Sister   . Hypertension Sister     BP 132/74  Pulse 73  Ht 5\' 11"  (1.803 m)  Wt 198 lb (89.812 kg)  BMI 27.63 kg/m2  SpO2 96%  Review of Systems He still has ED sxs.  Denies decreased urinary stream.      Objective:   Physical Exam VITAL SIGNS:  See vs page GENERAL: no distress Ext: no edema     Assessment & Plan:  Hypogonadism, on rx.  Clomid was chosen from the options, for its ease of use and lost cost. He has theoretical risk of prostate cancer, so we'll check PSA.

## 2012-09-01 NOTE — Patient Instructions (Addendum)
blood tests are being requested for you today.  We'll contact you with results. normalization of testosterone is not known to harm you.  however, there are "theoretical" risks, including increased fertility, hair loss, prostate cancer, benign prostate enlargement, blood clots, liver problems, lower hdl ("good cholesterol"), sleep apnea, and behavior changes. Please return in 1 year.   

## 2012-09-03 ENCOUNTER — Other Ambulatory Visit: Payer: Self-pay | Admitting: Endocrinology

## 2012-09-03 ENCOUNTER — Telehealth: Payer: Self-pay

## 2012-09-03 MED ORDER — TESTOSTERONE 12.5 MG/ACT (1%) TD GEL: 1.0000 | Freq: Every day | TRANSDERMAL | Status: DC

## 2012-09-03 NOTE — Telephone Encounter (Signed)
1 month, please

## 2012-09-03 NOTE — Telephone Encounter (Signed)
Pt would like to know when he needs to come back for an appt, instead of 1 year, since he is going to take the testosterone gel.

## 2012-09-08 ENCOUNTER — Telehealth: Payer: Self-pay | Admitting: Internal Medicine

## 2012-09-08 NOTE — Telephone Encounter (Signed)
Pt called in asking did he have to make a follow up appointment after he starts taking a gel to increase his testosterone levels, his next appointment here is not until next June. Also, patient states that he went to his pharmacy to pick up his gel and it was not there.  Please advise.

## 2012-09-08 NOTE — Telephone Encounter (Signed)
Please come back for a follow-up appointment in 1 month.  

## 2012-10-08 ENCOUNTER — Telehealth: Payer: Self-pay | Admitting: *Deleted

## 2012-10-08 ENCOUNTER — Encounter: Payer: Self-pay | Admitting: Endocrinology

## 2012-10-08 ENCOUNTER — Ambulatory Visit (INDEPENDENT_AMBULATORY_CARE_PROVIDER_SITE_OTHER): Payer: Medicare Other | Admitting: Endocrinology

## 2012-10-08 VITALS — BP 128/80 | HR 68 | Temp 97.7°F | Resp 12 | Wt 199.0 lb

## 2012-10-08 DIAGNOSIS — E291 Testicular hypofunction: Secondary | ICD-10-CM

## 2012-10-08 LAB — TESTOSTERONE: Testosterone: 111.07 ng/dL — ABNORMAL LOW (ref 350.00–890.00)

## 2012-10-08 NOTE — Progress Notes (Signed)
  Subjective:    Patient ID: Joshua Bullock, male    DOB: 25-Jan-1933, 79 y.o.   MRN: [id number]  HPI Pt returns for f/u of idiopathic central hypogonadism (dx'ed 2013).  He has 1 biological child (now age 65), which was born after a neg w/u of infertility.  He has never been rx'ed for hypogonadism prior to eval here.  He has never taken illicit androgens.  He did not respond to clomid, so he was changed to androgel.  Since on the that, he has increased diaphoresis.   Past Medical History  Diagnosis Date  . Hyperlipidemia   . Arthritis     Past Surgical History  Procedure Laterality Date  . Tonsillectomy  1955    History   Social History  . Marital Status: Married    Spouse Name: N/A    Number of Children: N/A  . Years of Education: N/A   Occupational History  . Not on file.   Social History Main Topics  . Smoking status: Former Games developer  . Smokeless tobacco: Never Used  . Alcohol Use: Yes     Comment: 1-2 a month wine or beer  . Drug Use: No  . Sexually Active: Not on file   Other Topics Concern  . Not on file   Social History Narrative  . No narrative on file    Current Outpatient Prescriptions on File Prior to Visit  Medication Sig Dispense Refill  . aspirin 81 MG tablet Take 81 mg by mouth daily.        Marland Kitchen azelastine (ASTELIN) 137 MCG/SPRAY nasal spray Place 2 sprays into the nose 2 (two) times daily. Use in each nostril as directed  30 mL  6  . CALCIUM-VITAMIN D PO Take 1 each by mouth daily.        . Multiple Vitamins-Minerals (EYE VITAMINS PO) Take 1 tablet by mouth daily.      . Multiple Vitamins-Minerals (MULTIVITAMIN,TX-MINERALS) tablet Take 1 tablet by mouth daily.        . pravastatin (PRAVACHOL) 40 MG tablet TAKE 1 TABLET EVERY DAY  90 tablet  2  . Testosterone (ANDROGEL PUMP) 12.5 MG/ACT (1%) GEL Place 1 Squirt onto the skin daily.  75 g  0   No current facility-administered medications on file prior to visit.    Allergies  Allergen Reactions  .  Influenza Virus Vacc Split Pf Other (See Comments)    Hospitalized with temperatures above 103 and classic symptoms of flu  . Tetanus Toxoid Hives    Family History  Problem Relation Age of Onset  . Arthritis Father   . Cancer Father   . Hypertension Father   . Arthritis Sister   . Cancer Sister   . Hypertension Sister     BP 128/80  Pulse 68  Temp(Src) 97.7 F (36.5 C) (Oral)  Resp 12  Wt 199 lb (90.266 kg)  BMI 27.77 kg/m2  SpO2 94%  Review of Systems Denies decreased urination and breast pain.      Objective:   Physical Exam VITAL SIGNS:  See vs page GENERAL: no distress Ext: no edema   Lab Results  Component Value Date   TESTOSTERONE 111.07* 10/08/2012      Assessment & Plan:  Hypogonadism.  He needs increased rx.  He needs to be increased slowly.

## 2012-10-08 NOTE — Telephone Encounter (Signed)
Called pt and let him know that he needs to increase his androgel by 2 squirts daily and we will see him at his next visit. Pt understood.

## 2012-10-08 NOTE — Patient Instructions (Addendum)
blood tests are being requested for you today.  We'll contact you with results. We will probably need to increase the androgel. Please return in 6 weeks.

## 2012-11-08 ENCOUNTER — Encounter: Payer: Self-pay | Admitting: Internal Medicine

## 2012-11-08 ENCOUNTER — Ambulatory Visit (INDEPENDENT_AMBULATORY_CARE_PROVIDER_SITE_OTHER): Payer: Medicare Other | Admitting: Internal Medicine

## 2012-11-08 VITALS — BP 150/80 | HR 60 | Temp 97.7°F | Wt 198.0 lb

## 2012-11-08 DIAGNOSIS — J449 Chronic obstructive pulmonary disease, unspecified: Secondary | ICD-10-CM

## 2012-11-08 DIAGNOSIS — R7309 Other abnormal glucose: Secondary | ICD-10-CM

## 2012-11-08 DIAGNOSIS — M899 Disorder of bone, unspecified: Secondary | ICD-10-CM

## 2012-11-08 DIAGNOSIS — J4489 Other specified chronic obstructive pulmonary disease: Secondary | ICD-10-CM

## 2012-11-08 LAB — HEMOGLOBIN A1C: Hgb A1c MFr Bld: 6.1 % (ref 4.6–6.5)

## 2012-11-08 NOTE — Assessment & Plan Note (Signed)
DEXA 04-2012 showed osteopenia. Today we discussed possibly Fosamax, he is not interested. Continue with calcium, vitamin D and active lifestyle.

## 2012-11-08 NOTE — Assessment & Plan Note (Signed)
Due for a1c, labs

## 2012-11-08 NOTE — Progress Notes (Signed)
  Subjective:    Patient ID: Joshua Bullock, male    DOB: [birth year], 79 y.o.   MRN: [id number]  HPI Routine office visit Hypogonadism, followup by endocrinology, recently testosterone dose was increased, he is feeling well, actually more energetic than before Hyperglycemia, not ambulatory CBGs, he remains very active.  Past Medical History:   Hyperlipidemia   Osteopenia per DEXA 07-2006, declined medicines   borderline DM   Allergic Rhinitis   COPD; PFTs 04-2010 mild to moderate dz, normal volumes   H/o colitis in the 80s   Past Surgical History:   Tonsillectomy and adenoidectomy   Family History:   breast Ca - M   prostate Ca - no   colon ca -- no   HTN - F   DM - no   CAD - no   stroke - no  Social History:   Married x 39 years, 1 child   Former Smoker (quit after 51 years on 11/23/2007)   Alcohol use-yes (occasionally)   [employer] x > 26 years    Review of Systems Denies cough, chest pain or shortness or breath. Good compliance with calcium vitamin D    Objective:   Physical Exam  BP 150/80  Pulse 60  Temp(Src) 97.7 F (36.5 C)  Wt 198 lb (89.812 kg)  BMI 27.63 kg/m2  SpO2 92% General -- alert, well-developed, NAD Lungs -- normal respiratory effort, no intercostal retractions, no accessory muscle use, and dry crackles at bases  Heart-- normal rate, regular rhythm, no murmur, and no gallop.  Extremities-- no pretibial edema bilaterally Neurologic-- alert & oriented X3 and strength normal in all extremities.  Psych-- Cognition and judgment appear intact. Alert and cooperative with normal attention span and concentration. not anxious appearing and not depressed appearing.      Assessment & Plan:

## 2012-11-08 NOTE — Patient Instructions (Addendum)
Get your blood work before you leave  Next visit in 6 months for a physical exam   Please make an appointment before you leave the office today (or call few weeks in advance)

## 2012-11-08 NOTE — Assessment & Plan Note (Signed)
Stable on clinical grounds, O2 sat 92 today, slightly lower than before. Plan: Reassess and return to the office.

## 2012-11-09 ENCOUNTER — Encounter: Payer: Self-pay | Admitting: Internal Medicine

## 2012-11-10 ENCOUNTER — Encounter: Payer: Self-pay | Admitting: *Deleted

## 2012-11-23 ENCOUNTER — Ambulatory Visit (INDEPENDENT_AMBULATORY_CARE_PROVIDER_SITE_OTHER): Payer: Medicare Other | Admitting: Endocrinology

## 2012-11-23 ENCOUNTER — Encounter: Payer: Self-pay | Admitting: Endocrinology

## 2012-11-23 VITALS — BP 136/78 | HR 78 | Ht 71.0 in | Wt 200.0 lb

## 2012-11-23 DIAGNOSIS — E291 Testicular hypofunction: Secondary | ICD-10-CM

## 2012-11-23 DIAGNOSIS — E785 Hyperlipidemia, unspecified: Secondary | ICD-10-CM

## 2012-11-23 LAB — TSH: TSH: 1.1 u[IU]/mL (ref 0.35–5.50)

## 2012-11-23 LAB — TESTOSTERONE: Testosterone: 65.42 ng/dL — ABNORMAL LOW (ref 350.00–890.00)

## 2012-11-23 NOTE — Progress Notes (Signed)
  Subjective:    Patient ID: Joshua Bullock, male    DOB: 1933-03-03, 79 y.o.   MRN: [id number]  HPI Pt returns for f/u of idiopathic central hypogonadism (dx'ed 2013; pituitary MRI was normal).  He has 1 biological child (now age 49), which was born after a neg w/u of infertility.  He has never been rx'ed for hypogonadism prior to eval here.  He has never taken illicit androgens.  He did not respond to clomid, so he was changed to androgel.  Since it was increased, he feels better in general.  He has little if any swelling at the breast areas, or assoc pain. Past Medical History  Diagnosis Date  . Hyperlipidemia   . Arthritis     Past Surgical History  Procedure Laterality Date  . Tonsillectomy  1955    History   Social History  . Marital Status: Married    Spouse Name: N/A    Number of Children: N/A  . Years of Education: N/A   Occupational History  . Not on file.   Social History Main Topics  . Smoking status: Former Games developer  . Smokeless tobacco: Never Used  . Alcohol Use: Yes     Comment: 1-2 a month wine or beer  . Drug Use: No  . Sexual Activity: Not on file   Other Topics Concern  . Not on file   Social History Narrative  . No narrative on file    Current Outpatient Prescriptions on File Prior to Visit  Medication Sig Dispense Refill  . aspirin 81 MG tablet Take 81 mg by mouth daily.        Marland Kitchen CALCIUM-VITAMIN D PO Take 1 each by mouth daily.        . Multiple Vitamins-Minerals (MULTIVITAMIN,TX-MINERALS) tablet Take 1 tablet by mouth daily.        . Multiple Vitamins-Minerals (PRESERVISION AREDS 2) CAPS Take 2 capsules by mouth 2 (two) times daily.      . pravastatin (PRAVACHOL) 40 MG tablet TAKE 1 TABLET EVERY DAY  90 tablet  2  . azelastine (ASTELIN) 137 MCG/SPRAY nasal spray Place 2 sprays into the nose 2 (two) times daily. Use in each nostril as directed  30 mL  6   No current facility-administered medications on file prior to visit.    Allergies   Allergen Reactions  . Influenza Virus Vacc Split Pf Other (See Comments)    Hospitalized with temperatures above 103 and classic symptoms of flu  . Tetanus Toxoid Hives    Family History  Problem Relation Age of Onset  . Arthritis Father   . Cancer Father   . Hypertension Father   . Arthritis Sister   . Cancer Sister   . Hypertension Sister    BP 136/78  Pulse 78  Ht 5\' 11"  (1.803 m)  Wt 200 lb (90.719 kg)  BMI 27.91 kg/m2  SpO2 98%  Review of Systems Denies decreased urinary stream.  Denies edema.      Objective:   Physical Exam VITAL SIGNS:  See vs page.   GENERAL: no distress.  Ext: no edema.    Lab Results  Component Value Date   TESTOSTERONE 65.42* 11/23/2012      Assessment & Plan:  Hypogonadism: he needs increased rx.

## 2012-11-23 NOTE — Patient Instructions (Addendum)
blood tests are being requested for you today.  We'll contact you with results. We will probably need to increase the androgel again.  Please return in 6 weeks.

## 2012-12-10 ENCOUNTER — Other Ambulatory Visit: Payer: Self-pay | Admitting: Endocrinology

## 2012-12-10 MED ORDER — TESTOSTERONE 12.5 MG/ACT (1%) TD GEL: 3.0000 | Freq: Every day | TRANSDERMAL | Status: DC

## 2012-12-14 ENCOUNTER — Telehealth: Payer: Self-pay | Admitting: *Deleted

## 2012-12-14 MED ORDER — TESTOSTERONE 20.25 MG/ACT (1.62%) TD GEL: 2.0000 | Freq: Every day | TRANSDERMAL | Status: DC

## 2012-12-14 NOTE — Telephone Encounter (Signed)
i printed 

## 2012-12-14 NOTE — Telephone Encounter (Signed)
Pt advised.

## 2012-12-14 NOTE — Telephone Encounter (Signed)
[facility] [address] [facility] is calling because Androgel 1% is not available.  Androgel 1.62% is available.  Please advise

## 2012-12-24 ENCOUNTER — Other Ambulatory Visit: Payer: Self-pay | Admitting: Internal Medicine

## 2012-12-24 NOTE — Telephone Encounter (Signed)
Refill sent to pharmacy for Astelin nasal spray

## 2013-03-11 ENCOUNTER — Other Ambulatory Visit: Payer: Self-pay | Admitting: Internal Medicine

## 2013-03-11 NOTE — Telephone Encounter (Signed)
rx refilled per protocol. DJR  

## 2013-04-24 ENCOUNTER — Other Ambulatory Visit: Payer: Self-pay | Admitting: Endocrinology

## 2013-04-25 MED ORDER — TESTOSTERONE 20.25 MG/ACT (1.62%) TD GEL: 2.0000 | Freq: Every day | TRANSDERMAL | Status: DC

## 2013-04-25 NOTE — Telephone Encounter (Signed)
Please advise, Thanks!  

## 2013-04-25 NOTE — Telephone Encounter (Signed)
i printed refill x 1 Ov is due 

## 2013-05-11 ENCOUNTER — Encounter: Payer: Medicare Other | Admitting: Internal Medicine

## 2013-05-23 ENCOUNTER — Ambulatory Visit (INDEPENDENT_AMBULATORY_CARE_PROVIDER_SITE_OTHER): Payer: Medicare Other | Admitting: Internal Medicine

## 2013-05-23 ENCOUNTER — Telehealth: Payer: Self-pay

## 2013-05-23 ENCOUNTER — Encounter: Payer: Self-pay | Admitting: Internal Medicine

## 2013-05-23 VITALS — BP 132/75 | HR 85 | Temp 98.0°F | Ht 71.4 in | Wt 199.0 lb

## 2013-05-23 DIAGNOSIS — E291 Testicular hypofunction: Secondary | ICD-10-CM

## 2013-05-23 DIAGNOSIS — H9190 Unspecified hearing loss, unspecified ear: Secondary | ICD-10-CM

## 2013-05-23 DIAGNOSIS — R7309 Other abnormal glucose: Secondary | ICD-10-CM

## 2013-05-23 DIAGNOSIS — J449 Chronic obstructive pulmonary disease, unspecified: Secondary | ICD-10-CM

## 2013-05-23 DIAGNOSIS — E785 Hyperlipidemia, unspecified: Secondary | ICD-10-CM

## 2013-05-23 DIAGNOSIS — Z0000 Encounter for general adult medical examination without abnormal findings: Secondary | ICD-10-CM

## 2013-05-23 DIAGNOSIS — J4489 Other specified chronic obstructive pulmonary disease: Secondary | ICD-10-CM

## 2013-05-23 LAB — LIPID PANEL
Cholesterol: 129 mg/dL (ref 0–200)
HDL: 26.4 mg/dL — ABNORMAL LOW (ref >39.00)
LDL Cholesterol: 44 mg/dL (ref 0–99)
TRIGLYCERIDES: 294 mg/dL — AB (ref 0.0–149.0)
Total CHOL/HDL Ratio: 5
VLDL: 58.8 mg/dL — ABNORMAL HIGH (ref 0.0–40.0)

## 2013-05-23 LAB — BASIC METABOLIC PANEL
BUN: 12 mg/dL (ref 6–23)
CALCIUM: 9.5 mg/dL (ref 8.4–10.5)
CHLORIDE: 101 meq/L (ref 96–112)
CO2: 30 meq/L (ref 19–32)
Creatinine, Ser: 1 mg/dL (ref 0.4–1.5)
GFR: 73.25 mL/min (ref >60.00)
Glucose, Bld: 92 mg/dL (ref 70–99)
Potassium: 4.1 mEq/L (ref 3.5–5.1)
SODIUM: 137 meq/L (ref 135–145)

## 2013-05-23 LAB — CBC WITH DIFFERENTIAL/PLATELET
BASOS ABS: 0 10*3/uL (ref 0.0–0.1)
Basophils Relative: 0.4 % (ref 0.0–3.0)
EOS PCT: 3 % (ref 0.0–5.0)
Eosinophils Absolute: 0.3 10*3/uL (ref 0.0–0.7)
HEMATOCRIT: 41.9 % (ref 39.0–52.0)
HEMOGLOBIN: 13.9 g/dL (ref 13.0–17.0)
LYMPHS ABS: 5 10*3/uL — AB (ref 0.7–4.0)
LYMPHS PCT: 42.7 % (ref 12.0–46.0)
MCHC: 33.3 g/dL (ref 30.0–36.0)
MCV: 95.4 fl (ref 78.0–100.0)
MONOS PCT: 6.8 % (ref 3.0–12.0)
Monocytes Absolute: 0.8 10*3/uL (ref 0.1–1.0)
Neutro Abs: 5.5 10*3/uL (ref 1.4–7.7)
Neutrophils Relative %: 47.1 % (ref 43.0–77.0)
PLATELETS: 244 10*3/uL (ref 150.0–400.0)
RBC: 4.39 Mil/uL (ref 4.22–5.81)
RDW: 13.5 % (ref 11.5–14.6)
WBC: 11.7 10*3/uL — AB (ref 4.5–10.5)

## 2013-05-23 LAB — AST: AST: 31 U/L (ref 0–37)

## 2013-05-23 LAB — HEMOGLOBIN A1C: Hgb A1c MFr Bld: 6.3 % (ref 4.6–6.5)

## 2013-05-23 LAB — ALT: ALT: 17 U/L (ref 0–53)

## 2013-05-23 MED ORDER — HYDROCORTISONE 2.5 % EX CREA: TOPICAL_CREAM | Freq: Two times a day (BID) | CUTANEOUS | Status: DC

## 2013-05-23 NOTE — Assessment & Plan Note (Signed)
On HRT, check a CBC

## 2013-05-23 NOTE — Assessment & Plan Note (Addendum)
Did see an audiologist, was recommended hearing aids, patient thinking about it

## 2013-05-23 NOTE — Assessment & Plan Note (Signed)
See previous entries, stable

## 2013-05-23 NOTE — Progress Notes (Signed)
Subjective:    Patient ID: Joshua Bullock, male    DOB: [birth year], 80 y.o.   MRN: [id number]  DOS:  05/23/2013 Type of  visit:   Here for Medicare AWV:  1.Risk factors based on Past M, S, F history: reviewed   2. Physical Activities: very active  3. Depression/mood: (-) screening   4. Hearing: saw the audiologist last year, thinking about get a hearing aid    5. ADL's: totally independent   6.  Fall Risk: low risk, see instructions   7.  Home Safety: does feel safe   8.   Height, weight, &visual acuity: see VS, vision corrected w/ glasses, sees eye doctor regularly   9.  Counseling: yes   10. Labs ordered based on risk factors: yes   11. Referral Coordination, if needed   12. Care Plan, see a/p   13. Cognitive Assessment: cognition and memory seem appropiate for age   in addition, we discussed the following issues Hyperlipidemia--on Pravachol, good compliance with medications Hypogonadism--follow up by endocrinology COPD--on no medications,  Able to do all his activities of daily living and remain active without symptoms Has a rash in the left arm for 3 or 4 months, dry, scaly, not itchy  ROS No  CP, SOB No palpitations, no lower extremity edema Denies  nausea, vomiting diarrhea  Denies  blood in the stools (-) cough, sputum production (-) wheezing, chest congestion  No dysuria, gross hematuria, difficulty urinating   Past Medical History:   Hyperlipidemia   Osteopenia per DEXA 07-2006, declined medicines   borderline DM   Allergic Rhinitis   COPD; PFTs 04-2010 mild to moderate dz, normal volumes   H/o colitis in the 80s   Past Surgical History:   Tonsillectomy and adenoidectomy   Family History:   breast Ca - M   prostate Ca - no   colon ca -- no   HTN - F   DM - no   CAD - no   stroke - no  Social History:   Married x 49 years, 1 child   Former Smoker (quit after 51 years on 11/23/2007)   Alcohol use-yes (occasionally)   [employer] retiring 2015        Medication List       This list is accurate as of: 05/23/13  5:59 PM.  Always use your most recent med list.               ANDROGEL PUMP 20.25 MG/ACT (1.62%) Gel  Generic drug:  Testosterone  PLACE 2 SQUIRTS ON SKIN DAILY     aspirin 81 MG tablet  Take 81 mg by mouth daily.     azelastine 137 MCG/SPRAY nasal spray  Commonly known as:  ASTELIN  PLACE 2 SPRAYS INTO THE NOSE 2 (TWO) TIMES DAILY. USE IN EACH NOSTRIL AS DIRECTED     CALCIUM-VITAMIN D PO  Take 1 each by mouth daily.     hydrocortisone 2.5 % cream  Apply topically 2 (two) times daily.     multivitamin,tx-minerals tablet  Take 1 tablet by mouth daily.     PRESERVISION AREDS 2 Caps  Take 2 capsules by mouth 2 (two) times daily.     pravastatin 40 MG tablet  Commonly known as:  PRAVACHOL  TAKE 1 TABLET EVERY DAY           Objective:   Physical Exam BP 132/75  Pulse 85  Temp(Src) 98 F (36.7 C)  Ht 5' 11.4" (  1.814 m)  Wt 199 lb (90.266 kg)  BMI 27.43 kg/m2  SpO2 97% General -- alert, well-developed, NAD.  Neck --no thyromegaly , normal carotid pulse  HEENT-- Not pale.   Lungs -- normal respiratory effort, no intercostal retractions, no accessory muscle use;Dry crackles at bases bilaterally, not a new finding Heart-- normal rate, regular rhythm, no murmur.  Abdomen-- Not distended, good bowel sounds,soft, non-tender. Extremities-- no pretibial edema bilaterally  Neurologic--  alert & oriented X3. Speech normal, gait normal, strength normal in all extremities.  Psych-- Cognition and judgment appear intact. Cooperative with normal attention span and concentration. No anxious or depressed appearing.     Assessment & Plan:   Rash-- Eczema?  trial With topical steroids, if not better she will let me know for a dermatology referral

## 2013-05-23 NOTE — Assessment & Plan Note (Addendum)
Td-- allergic influenza shot-- allergic pneumonia shot, zostavax --declined  again  Last PSA few months ago < 1.0 last  Cscopes 3-13, 2 polyps, next 5 years per chart review  Retiring from the [employer]-- rec to stay active

## 2013-05-23 NOTE — Assessment & Plan Note (Signed)
Due for labs

## 2013-05-23 NOTE — Telephone Encounter (Signed)
Medication List and allergies:  Updated and Reviewed  90 day supply/mail order: n/a Local prescriptions:  [id number] [place], [place] - [address]  Immunization due:  Influenza-allergic; Tetanus-allergic; PNA-declined, Shingles- declined  A/P: No changes to personal, family or PSH CCS- 06/13/11- Dr. Marshell Levan polyps, diverticulosis, internal hemorrhoids PSA- 09/01/12- 0.05   To discuss with provider: Concerned about patchy area on lower arm.  Dermatologist?

## 2013-05-23 NOTE — Patient Instructions (Signed)
Get your blood work before you leave   Next visit is for a physical exam in 1 year fasting Please make an appointment    Fall Prevention and [place] cause injuries and can affect all age groups. It is possible to use preventive measures to significantly decrease the likelihood of falls. There are many simple measures which can make your home safer and prevent falls. OUTDOORS  Repair cracks and edges of walkways and driveways.  Remove high doorway thresholds.  Trim shrubbery on the main path into your home.  Have good outside lighting.  Clear walkways of tools, rocks, debris, and clutter.  Check that handrails are not broken and are securely fastened. Both sides of steps should have handrails.  Have leaves, snow, and ice cleared regularly.  Use sand or salt on walkways during winter months.  In the garage, clean up grease or oil spills. BATHROOM  Install night lights.  Install grab bars by the toilet and in the tub and shower.  Use non-skid mats or decals in the tub or shower.  Place a plastic non-slip stool in the shower to sit on, if needed.  Keep floors dry and clean up all water on the floor immediately.  Remove soap buildup in the tub or shower on a regular basis.  Secure bath mats with non-slip, double-sided rug tape.  Remove throw rugs and tripping hazards from the floors. BEDROOMS  Install night lights.  Make sure a bedside light is easy to reach.  Do not use oversized bedding.  Keep a telephone by your bedside.  Have a firm chair with side arms to use for getting dressed.  Remove throw rugs and tripping hazards from the floor. KITCHEN  Keep handles on pots and pans turned toward the center of the stove. Use back burners when possible.  Clean up spills quickly and allow time for drying.  Avoid walking on wet floors.  Avoid hot utensils and knives.  Position shelves so they are not too high or low.  Place commonly used objects within  easy reach.  If necessary, use a sturdy step stool with a grab bar when reaching.  Keep electrical cables out of the way.  Do not use floor polish or wax that makes floors slippery. If you must use wax, use non-skid floor wax.  Remove throw rugs and tripping hazards from the floor. STAIRWAYS  Never leave objects on stairs.  Place handrails on both sides of stairways and use them. Fix any loose handrails. Make sure handrails on both sides of the stairways are as long as the stairs.  Check carpeting to make sure it is firmly attached along stairs. Make repairs to worn or loose carpet promptly.  Avoid placing throw rugs at the top or bottom of stairways, or properly secure the rug with carpet tape to prevent slippage. Get rid of throw rugs, if possible.  Have an electrician put in a light switch at the top and bottom of the stairs. OTHER FALL PREVENTION TIPS  Wear low-heel or rubber-soled shoes that are supportive and fit well. Wear closed toe shoes.  When using a stepladder, make sure it is fully opened and both spreaders are firmly locked. Do not climb a closed stepladder.  Add color or contrast paint or tape to grab bars and handrails in your home. Place contrasting color strips on first and last steps.  Learn and use mobility aids as needed. Install an electrical emergency response system.  Turn on lights to avoid  dark areas. Replace light bulbs that burn out immediately. Get light switches that glow.  Arrange furniture to create clear pathways. Keep furniture in the same place.  Firmly attach carpet with non-skid or double-sided tape.  Eliminate uneven floor surfaces.  Select a carpet pattern that does not visually hide the edge of steps.  Be aware of all pets. OTHER HOME SAFETY TIPS  Set the water temperature for 120 F (48.8 C).  Keep emergency numbers on or near the telephone.  Keep smoke detectors on every level of the home and near sleeping areas. Document  Released: 02/28/2002 Document Revised: 09/09/2011 Document Reviewed: 05/30/2011 [facility] Patient Information 2014 [place].

## 2013-05-24 ENCOUNTER — Telehealth: Payer: Self-pay | Admitting: Internal Medicine

## 2013-05-24 NOTE — Telephone Encounter (Signed)
Relevant patient education assigned to patient using Emmi. ° °

## 2013-05-25 ENCOUNTER — Other Ambulatory Visit: Payer: Self-pay | Admitting: Internal Medicine

## 2013-05-25 DIAGNOSIS — D72829 Elevated white blood cell count, unspecified: Secondary | ICD-10-CM

## 2013-05-27 ENCOUNTER — Other Ambulatory Visit (INDEPENDENT_AMBULATORY_CARE_PROVIDER_SITE_OTHER): Payer: Medicare Other

## 2013-05-27 DIAGNOSIS — Z5181 Encounter for therapeutic drug level monitoring: Secondary | ICD-10-CM

## 2013-05-27 DIAGNOSIS — Z79899 Other long term (current) drug therapy: Secondary | ICD-10-CM

## 2013-05-27 DIAGNOSIS — D72829 Elevated white blood cell count, unspecified: Secondary | ICD-10-CM

## 2013-05-27 NOTE — Addendum Note (Signed)
Addended by: Modena Morrow D on: 05/27/2013 10:43 AM   Modules accepted: Orders

## 2013-05-28 LAB — CBC WITH DIFFERENTIAL/PLATELET
BASOS PCT: 1 % (ref 0–1)
Basophils Absolute: 0.1 10*3/uL (ref 0.0–0.1)
Eosinophils Absolute: 0.3 10*3/uL (ref 0.0–0.7)
Eosinophils Relative: 3 % (ref 0–5)
HCT: 41.1 % (ref 39.0–52.0)
Hemoglobin: 14.1 g/dL (ref 13.0–17.0)
LYMPHS ABS: 5.4 10*3/uL — AB (ref 0.7–4.0)
Lymphocytes Relative: 55 % — ABNORMAL HIGH (ref 12–46)
MCH: 31.6 pg (ref 26.0–34.0)
MCHC: 34.3 g/dL (ref 30.0–36.0)
MCV: 92.2 fL (ref 78.0–100.0)
Monocytes Absolute: 0.8 10*3/uL (ref 0.1–1.0)
Monocytes Relative: 8 % (ref 3–12)
NEUTROS PCT: 33 % — AB (ref 43–77)
Neutro Abs: 3.3 10*3/uL (ref 1.7–7.7)
PLATELETS: 218 10*3/uL (ref 150–400)
RBC: 4.46 MIL/uL (ref 4.22–5.81)
RDW: 14.6 % (ref 11.5–15.5)
WBC: 9.9 10*3/uL (ref 4.0–10.5)

## 2013-05-30 LAB — PATHOLOGIST SMEAR REVIEW

## 2013-06-17 ENCOUNTER — Other Ambulatory Visit: Payer: Self-pay | Admitting: Endocrinology

## 2013-06-17 MED ORDER — TESTOSTERONE 20.25 MG/ACT (1.62%) TD GEL: 3.0000 | Freq: Every day | TRANSDERMAL | Status: DC

## 2013-06-19 ENCOUNTER — Other Ambulatory Visit: Payer: Self-pay | Admitting: Internal Medicine

## 2013-06-20 ENCOUNTER — Telehealth: Payer: Self-pay | Admitting: Endocrinology

## 2013-06-20 NOTE — Telephone Encounter (Signed)
The androgel rx is a higher dosage than usual is this correct?

## 2013-06-21 NOTE — Telephone Encounter (Signed)
See below and please advise, Thanks!  

## 2013-06-21 NOTE — Telephone Encounter (Signed)
please call patient: Ov is due 

## 2013-07-04 ENCOUNTER — Encounter: Payer: Self-pay | Admitting: Endocrinology

## 2013-07-04 ENCOUNTER — Ambulatory Visit (INDEPENDENT_AMBULATORY_CARE_PROVIDER_SITE_OTHER): Payer: Medicare Other | Admitting: Endocrinology

## 2013-07-04 VITALS — BP 120/82 | HR 83 | Temp 98.5°F | Ht 71.0 in | Wt 198.0 lb

## 2013-07-04 DIAGNOSIS — E291 Testicular hypofunction: Secondary | ICD-10-CM

## 2013-07-04 NOTE — Patient Instructions (Addendum)
blood tests are being requested for you today.  We'll contact you with results. normalization of testosterone is not known to harm you.  however, there are "theoretical" risks, including increased fertility, hair loss, prostate cancer, benign prostate enlargement, blood clots, liver problems, lower hdl ("good cholesterol"), sleep apnea, and behavior changes. Please return in 6 months.  

## 2013-07-04 NOTE — Progress Notes (Signed)
   Subjective:    Patient ID: Joshua Bullock, male    DOB: 1933/02/18, 80 y.o.   MRN: [id number]  HPI Pt returns for f/u of idiopathic central hypogonadism (dx'ed 2013; pituitary MRI was normal).  He has 1 biological child (now age 58), which was born after a neg w/u of infertility.  He has never been rx'ed for hypogonadism prior to eval here.  He has never taken illicit androgens.  He did not respond to clomid, so he was changed to androgel.  Since on the increased androgel, fatigue is improved.   Past Medical History  Diagnosis Date  . Hyperlipidemia   . Arthritis     Past Surgical History  Procedure Laterality Date  . Tonsillectomy  1955    History   Social History  . Marital Status: Married    Spouse Name: N/A    Number of Children: N/A  . Years of Education: N/A   Occupational History  . Not on file.   Social History Main Topics  . Smoking status: Former Research scientist (life sciences)  . Smokeless tobacco: Never Used  . Alcohol Use: Yes     Comment: 1-2 a month wine or beer  . Drug Use: No  . Sexual Activity: Not on file   Other Topics Concern  . Not on file   Social History Narrative  . No narrative on file    Current Outpatient Prescriptions on File Prior to Visit  Medication Sig Dispense Refill  . aspirin 81 MG tablet Take 81 mg by mouth daily.        Marland Kitchen azelastine (ASTELIN) 137 MCG/SPRAY nasal spray PLACE 2 SPRAYS INTO THE NOSE 2 (TWO) TIMES DAILY. USE IN EACH NOSTRIL AS DIRECTED  30 mL  5  . CALCIUM-VITAMIN D PO Take 1 each by mouth daily.        . hydrocortisone 2.5 % cream Apply topically 2 (two) times daily.  30 g  0  . Multiple Vitamins-Minerals (MULTIVITAMIN,TX-MINERALS) tablet Take 1 tablet by mouth daily.        . Multiple Vitamins-Minerals (PRESERVISION AREDS 2) CAPS Take 2 capsules by mouth 2 (two) times daily.      . pravastatin (PRAVACHOL) 40 MG tablet TAKE 1 TABLET BY MOUTH DAILY  90 tablet  2  . Testosterone (ANDROGEL PUMP) 20.25 MG/ACT (1.62%) GEL Place 3  Squirts onto the skin daily.  75 g  2   No current facility-administered medications on file prior to visit.    Allergies  Allergen Reactions  . Influenza Virus Vacc Split Pf Other (See Comments)    Hospitalized with temperatures above 103 and classic symptoms of flu  . Tetanus Toxoid Hives    Family History  Problem Relation Age of Onset  . Arthritis Father   . Cancer Father   . Hypertension Father   . Arthritis Sister   . Cancer Sister   . Hypertension Sister     BP 120/82  Pulse 83  Temp(Src) 98.5 F (36.9 C) (Oral)  Ht 5\' 11"  (1.803 m)  Wt 198 lb (89.812 kg)  BMI 27.63 kg/m2  SpO2 93%   Review of Systems Denies decreased urinary stream.    Objective:   Physical Exam VITAL SIGNS:  See vs page GENERAL: no distress GENITALIA: Normal scrotum and penis.  Testicles are small and soft.         Assessment & Plan:  Hypogonadism, on rx COPD and other med probs: these can exacerbate sxs of hypogonadism

## 2013-07-05 ENCOUNTER — Other Ambulatory Visit: Payer: Self-pay | Admitting: Endocrinology

## 2013-07-05 LAB — TESTOSTERONE: Testosterone: 184.26 ng/dL — ABNORMAL LOW (ref 350.00–890.00)

## 2013-07-05 MED ORDER — TESTOSTERONE 20.25 MG/ACT (1.62%) TD GEL: 4.0000 | Freq: Every day | TRANSDERMAL | Status: DC

## 2013-07-14 ENCOUNTER — Telehealth: Payer: Self-pay | Admitting: Endocrinology

## 2013-07-14 NOTE — Telephone Encounter (Signed)
Patient called in requesting a new rx of androgel to be sent to [facility] [address]. He states that he needs two bottles sent in now since the dosage has increased to "4 squirts" a day.

## 2013-07-14 NOTE — Telephone Encounter (Signed)
Pt informed that Rx was sent on 07/05/2013. Rx re-sent to pharmacy.

## 2013-08-31 ENCOUNTER — Ambulatory Visit: Payer: Medicare Other | Admitting: Endocrinology

## 2013-09-08 ENCOUNTER — Other Ambulatory Visit: Payer: Self-pay | Admitting: Endocrinology

## 2013-09-14 ENCOUNTER — Other Ambulatory Visit: Payer: Self-pay | Admitting: Internal Medicine

## 2013-10-31 ENCOUNTER — Other Ambulatory Visit: Payer: Self-pay | Admitting: Endocrinology

## 2013-12-29 ENCOUNTER — Other Ambulatory Visit: Payer: Self-pay | Admitting: Endocrinology

## 2013-12-29 NOTE — Telephone Encounter (Signed)
Rx faxed to pharmacy  

## 2013-12-29 NOTE — Telephone Encounter (Signed)
i printed 

## 2014-01-04 ENCOUNTER — Encounter: Payer: Self-pay | Admitting: Endocrinology

## 2014-01-04 ENCOUNTER — Ambulatory Visit (INDEPENDENT_AMBULATORY_CARE_PROVIDER_SITE_OTHER): Payer: Medicare Other | Admitting: Endocrinology

## 2014-01-04 VITALS — BP 128/70 | HR 58 | Temp 97.5°F | Ht 71.0 in | Wt 198.0 lb

## 2014-01-04 DIAGNOSIS — E291 Testicular hypofunction: Secondary | ICD-10-CM

## 2014-01-04 DIAGNOSIS — Z125 Encounter for screening for malignant neoplasm of prostate: Secondary | ICD-10-CM

## 2014-01-04 LAB — PSA, MEDICARE: PSA: 0.78 ng/mL (ref 0.10–4.00)

## 2014-01-04 LAB — TESTOSTERONE: TESTOSTERONE: 177.19 ng/dL — AB (ref 300.00–890.00)

## 2014-01-04 NOTE — Progress Notes (Signed)
Subjective:    Patient ID: Joshua Bullock, male    DOB: 1932-06-30, 80 y.o.   MRN: [id number]  HPI Pt returns for f/u of idiopathic central hypogonadism (dx'ed 2013; pituitary MRI was normal).  He has 1 biological child (now age 78), which was born after a neg w/u of infertility.  He has never been rx'ed for hypogonadism prior to eval here.  He has never taken illicit androgens.  He did not respond to clomid, so he was changed to androgel.  pt states he feels well in general.  He says he has not recently misses the androgel.   Past Medical History  Diagnosis Date  . Hyperlipidemia   . Arthritis     Past Surgical History  Procedure Laterality Date  . Tonsillectomy  1955    History   Social History  . Marital Status: Married    Spouse Name: N/A    Number of Children: N/A  . Years of Education: N/A   Occupational History  . Not on file.   Social History Main Topics  . Smoking status: Former Research scientist (life sciences)  . Smokeless tobacco: Never Used  . Alcohol Use: Yes     Comment: 1-2 a month wine or beer  . Drug Use: No  . Sexual Activity: Not on file   Other Topics Concern  . Not on file   Social History Narrative  . No narrative on file    Current Outpatient Prescriptions on File Prior to Visit  Medication Sig Dispense Refill  . aspirin 81 MG tablet Take 81 mg by mouth daily.        Marland Kitchen azelastine (ASTELIN) 0.1 % nasal spray PLACE 2 SPRAYS INTO THE NOSE 2 (TWO) TIMES DAILY. USE IN EACH NOSTRIL AS DIRECTED  30 mL  5  . CALCIUM-VITAMIN D PO Take 1 each by mouth daily.        . hydrocortisone 2.5 % cream Apply topically 2 (two) times daily.  30 g  0  . Multiple Vitamins-Minerals (MULTIVITAMIN,TX-MINERALS) tablet Take 1 tablet by mouth daily.        . Multiple Vitamins-Minerals (PRESERVISION AREDS 2) CAPS Take 2 capsules by mouth 2 (two) times daily.      . pravastatin (PRAVACHOL) 40 MG tablet TAKE 1 TABLET BY MOUTH DAILY  90 tablet  2   No current facility-administered  medications on file prior to visit.    Allergies  Allergen Reactions  . Influenza Virus Vacc Split Pf Other (See Comments)    Hospitalized with temperatures above 103 and classic symptoms of flu  . Tetanus Toxoid Hives    Family History  Problem Relation Age of Onset  . Arthritis Father   . Cancer Father   . Hypertension Father   . Arthritis Sister   . Cancer Sister   . Hypertension Sister     BP 128/70  Pulse 58  Temp(Src) 97.5 F (36.4 C) (Oral)  Ht 5\' 11"  (1.803 m)  Wt 198 lb (89.812 kg)  BMI 27.63 kg/m2  SpO2 97%    Review of Systems He denies decreased urinary stream and ED sxs.    Objective:   Physical Exam VITAL SIGNS:  See vs page. GENERAL: no distress. Ext: no edema.  Lab Results  Component Value Date   TESTOSTERONE 177.19* 01/04/2014       Assessment & Plan:  Hypogonadism: with persistently low testosterone level despite increasing rx.    Patient is advised the following: Patient Instructions  blood tests are  being requested for you today.  We'll contact you with results. normalization of testosterone is not known to harm you.  however, there are "theoretical" risks, including increased fertility, hair loss, prostate cancer, benign prostate enlargement, blood clots, liver problems, lower hdl ("good cholesterol"), sleep apnea, and behavior changes. Please return in 6 months.   addendum: increase androgel, but 5 pumps per day is max I will go up to.

## 2014-01-04 NOTE — Patient Instructions (Addendum)
blood tests are being requested for you today.  We'll contact you with results. normalization of testosterone is not known to harm you.  however, there are "theoretical" risks, including increased fertility, hair loss, prostate cancer, benign prostate enlargement, blood clots, liver problems, lower hdl ("good cholesterol"), sleep apnea, and behavior changes. Please return in 6 months.

## 2014-01-13 ENCOUNTER — Other Ambulatory Visit: Payer: Self-pay | Admitting: Endocrinology

## 2014-01-13 MED ORDER — TESTOSTERONE 20.25 MG/ACT (1.62%) TD GEL: TRANSDERMAL | Status: DC

## 2014-03-04 ENCOUNTER — Other Ambulatory Visit: Payer: Self-pay | Admitting: Internal Medicine

## 2014-03-21 ENCOUNTER — Other Ambulatory Visit: Payer: Self-pay | Admitting: Internal Medicine

## 2014-03-24 HISTORY — PX: EYE SURGERY: SHX253

## 2014-04-05 ENCOUNTER — Other Ambulatory Visit: Payer: Self-pay | Admitting: Endocrinology

## 2014-04-05 ENCOUNTER — Telehealth: Payer: Self-pay | Admitting: Endocrinology

## 2014-04-05 MED ORDER — TESTOSTERONE 20.25 MG/ACT (1.62%) TD GEL: TRANSDERMAL | Status: DC

## 2014-04-05 NOTE — Telephone Encounter (Signed)
See below . Thanks

## 2014-04-05 NOTE — Telephone Encounter (Signed)
Rx faxed unable to reach pt to advise.

## 2014-04-05 NOTE — Telephone Encounter (Signed)
i printed 

## 2014-04-05 NOTE — Telephone Encounter (Signed)
Patient states his insurance company will not cover his androgel under 31 day supply   Please update Rx to 31 day supply    Thank you

## 2014-04-25 DIAGNOSIS — Z0279 Encounter for issue of other medical certificate: Secondary | ICD-10-CM

## 2014-05-24 ENCOUNTER — Telehealth: Payer: Self-pay

## 2014-05-24 NOTE — Telephone Encounter (Signed)
Medication: Review, verify sig & reconcile(including outside meds):  yes Duplicates discarded: n/a DM supply source: n/a  Preferred Pharmacy and which med where:  [id number] - [place], [place] 90 day supply/mail order: no longer using- was [facility]- removed from patient's pharmacy list Local pharmacy:   [id number] - [place], [place] [address] verified:  yes  Immunization Status: Prompted for insurance verification: N/a Flu vaccine-- allergic Tdap-- allergic PNA-- declined Shingles-- declined  A/P:   Changes to [place], PSH or Personal Hx:  Reviewed. No changes.  CCS:  06/13/11- Dr. Marshell Levan polyps, diverticulosis, internal hemorrhoids- repeat in 5 years (2018).  PSA:  01/04/14- 0.78 Bone Density:  05/23/12- Significant osteopenia   Care Teams Updated: ED/Hospital/Urgent Care Visits: no  Prompted for: Updated insurance, contact information, forms: yes Remind to bring: DPR information, advance directives: yes  To Discuss with Provider:  Nothing at this time.

## 2014-05-26 ENCOUNTER — Encounter: Payer: Self-pay | Admitting: Internal Medicine

## 2014-05-26 ENCOUNTER — Ambulatory Visit (INDEPENDENT_AMBULATORY_CARE_PROVIDER_SITE_OTHER): Payer: Medicare Other | Admitting: Internal Medicine

## 2014-05-26 VITALS — BP 138/74 | HR 64 | Temp 98.2°F | Ht 71.0 in | Wt 191.5 lb

## 2014-05-26 DIAGNOSIS — R7303 Prediabetes: Secondary | ICD-10-CM

## 2014-05-26 DIAGNOSIS — J42 Unspecified chronic bronchitis: Secondary | ICD-10-CM

## 2014-05-26 DIAGNOSIS — E785 Hyperlipidemia, unspecified: Secondary | ICD-10-CM

## 2014-05-26 DIAGNOSIS — R0989 Other specified symptoms and signs involving the circulatory and respiratory systems: Secondary | ICD-10-CM

## 2014-05-26 DIAGNOSIS — E871 Hypo-osmolality and hyponatremia: Secondary | ICD-10-CM

## 2014-05-26 DIAGNOSIS — Z0000 Encounter for general adult medical examination without abnormal findings: Secondary | ICD-10-CM

## 2014-05-26 DIAGNOSIS — Z131 Encounter for screening for diabetes mellitus: Secondary | ICD-10-CM

## 2014-05-26 LAB — LIPID PANEL
CHOLESTEROL: 111 mg/dL (ref 0–200)
HDL: 28.7 mg/dL — ABNORMAL LOW (ref >39.00)
LDL Cholesterol: 59 mg/dL (ref 0–99)
NonHDL: 82.3
Total CHOL/HDL Ratio: 4
Triglycerides: 116 mg/dL (ref 0.0–149.0)
VLDL: 23.2 mg/dL (ref 0.0–40.0)

## 2014-05-26 LAB — CBC WITH DIFFERENTIAL/PLATELET
BASOS ABS: 0 10*3/uL (ref 0.0–0.1)
Basophils Relative: 0.4 % (ref 0.0–3.0)
EOS ABS: 0.3 10*3/uL (ref 0.0–0.7)
Eosinophils Relative: 2.4 % (ref 0.0–5.0)
HCT: 46.1 % (ref 39.0–52.0)
HEMOGLOBIN: 15.8 g/dL (ref 13.0–17.0)
LYMPHS PCT: 28.5 % (ref 12.0–46.0)
Lymphs Abs: 3.3 10*3/uL (ref 0.7–4.0)
MCHC: 34.2 g/dL (ref 30.0–36.0)
MCV: 92.8 fl (ref 78.0–100.0)
MONOS PCT: 7.7 % (ref 3.0–12.0)
Monocytes Absolute: 0.9 10*3/uL (ref 0.1–1.0)
NEUTROS ABS: 7.1 10*3/uL (ref 1.4–7.7)
Neutrophils Relative %: 61 % (ref 43.0–77.0)
Platelets: 242 10*3/uL (ref 150.0–400.0)
RBC: 4.97 Mil/uL (ref 4.22–5.81)
RDW: 13.6 % (ref 11.5–15.5)
WBC: 11.6 10*3/uL — ABNORMAL HIGH (ref 4.0–10.5)

## 2014-05-26 LAB — BASIC METABOLIC PANEL
BUN: 12 mg/dL (ref 6–23)
CO2: 31 mEq/L (ref 19–32)
Calcium: 9.7 mg/dL (ref 8.4–10.5)
Chloride: 95 mEq/L — ABNORMAL LOW (ref 96–112)
Creatinine, Ser: 1.07 mg/dL (ref 0.40–1.50)
GFR: 70.7 mL/min (ref >60.00)
GLUCOSE: 111 mg/dL — AB (ref 70–99)
POTASSIUM: 4.6 meq/L (ref 3.5–5.1)
Sodium: 131 mEq/L — ABNORMAL LOW (ref 135–145)

## 2014-05-26 LAB — AST: AST: 30 U/L (ref 0–37)

## 2014-05-26 LAB — ALT: ALT: 16 U/L (ref 0–53)

## 2014-05-26 LAB — HEMOGLOBIN A1C: Hgb A1c MFr Bld: 6.2 % (ref 4.6–6.5)

## 2014-05-26 NOTE — Progress Notes (Signed)
Pre visit review using our clinic review tool, if applicable. No additional management support is needed unless otherwise documented below in the visit note. 

## 2014-05-26 NOTE — Assessment & Plan Note (Addendum)
Td-- allergic influenza shot-- allergic pneumonia shot, zostavax --declined  again  Last PSA 12-2013 -- 0.78 DRE (-) today last  Cscopes 3-13, 2 polyps, next 5 years per chart review   Palpable Ao----- check a [place]

## 2014-05-26 NOTE — Assessment & Plan Note (Signed)
Doing well , oligo symptomatic

## 2014-05-26 NOTE — Patient Instructions (Signed)
Get your blood work before you leave     Please come back to the office in 6 months  for a routine check up  No  fasting    Depending on your labs,  you may need to come back  fasting Schedule the visit at the front desk   Fall Prevention and [place] cause injuries and can affect all age groups. It is possible to use preventive measures to significantly decrease the likelihood of falls. There are many simple measures which can make your home safer and prevent falls. OUTDOORS  Repair cracks and edges of walkways and driveways.  Remove high doorway thresholds.  Trim shrubbery on the main path into your home.  Have good outside lighting.  Clear walkways of tools, rocks, debris, and clutter.  Check that handrails are not broken and are securely fastened. Both sides of steps should have handrails.  Have leaves, snow, and ice cleared regularly.  Use sand or salt on walkways during winter months.  In the garage, clean up grease or oil spills. BATHROOM  Install night lights.  Install grab bars by the toilet and in the tub and shower.  Use non-skid mats or decals in the tub or shower.  Place a plastic non-slip stool in the shower to sit on, if needed.  Keep floors dry and clean up all water on the floor immediately.  Remove soap buildup in the tub or shower on a regular basis.  Secure bath mats with non-slip, double-sided rug tape.  Remove throw rugs and tripping hazards from the floors. BEDROOMS  Install night lights.  Make sure a bedside light is easy to reach.  Do not use oversized bedding.  Keep a telephone by your bedside.  Have a firm chair with side arms to use for getting dressed.  Remove throw rugs and tripping hazards from the floor. KITCHEN  Keep handles on pots and pans turned toward the center of the stove. Use back burners when possible.  Clean up spills quickly and allow time for drying.  Avoid walking on wet floors.  Avoid hot  utensils and knives.  Position shelves so they are not too high or low.  Place commonly used objects within easy reach.  If necessary, use a sturdy step stool with a grab bar when reaching.  Keep electrical cables out of the way.  Do not use floor polish or wax that makes floors slippery. If you must use wax, use non-skid floor wax.  Remove throw rugs and tripping hazards from the floor. STAIRWAYS  Never leave objects on stairs.  Place handrails on both sides of stairways and use them. Fix any loose handrails. Make sure handrails on both sides of the stairways are as long as the stairs.  Check carpeting to make sure it is firmly attached along stairs. Make repairs to worn or loose carpet promptly.  Avoid placing throw rugs at the top or bottom of stairways, or properly secure the rug with carpet tape to prevent slippage. Get rid of throw rugs, if possible.  Have an electrician put in a light switch at the top and bottom of the stairs. OTHER FALL PREVENTION TIPS  Wear low-heel or rubber-soled shoes that are supportive and fit well. Wear closed toe shoes.  When using a stepladder, make sure it is fully opened and both spreaders are firmly locked. Do not climb a closed stepladder.  Add color or contrast paint or tape to grab bars and handrails in your home. Place  contrasting color strips on first and last steps.  Learn and use mobility aids as needed. Install an electrical emergency response system.  Turn on lights to avoid dark areas. Replace light bulbs that burn out immediately. Get light switches that glow.  Arrange furniture to create clear pathways. Keep furniture in the same place.  Firmly attach carpet with non-skid or double-sided tape.  Eliminate uneven floor surfaces.  Select a carpet pattern that does not visually hide the edge of steps.  Be aware of all pets. OTHER HOME SAFETY TIPS  Set the water temperature for 120 F (48.8 C).  Keep emergency numbers on  or near the telephone.  Keep smoke detectors on every level of the home and near sleeping areas. Document Released: 02/28/2002 Document Revised: 09/09/2011 Document Reviewed: 05/30/2011 [facility] Patient Information 2015 [place], [place]. This information is not intended to replace advice given to you by your health care provider. Make sure you discuss any questions you have with your health care provider.   Preventive Care for Adults Ages 31 and over  Blood pressure check.** / Every 1 to 2 years.  Lipid and cholesterol check.**/ Every 5 years beginning at age 51.  Lung cancer screening. / Every year if you are aged 45-80 years and have a 30-pack-year history of smoking and currently smoke or have quit within the past 15 years. Yearly screening is stopped once you have quit smoking for at least 15 years or develop a health problem that would prevent you from having lung cancer treatment.  Fecal occult blood test (FOBT) of stool. / Every year beginning at age 62 and continuing until age 67. You may not have to do this test if you get a colonoscopy every 10 years.  Flexible sigmoidoscopy** or colonoscopy.** / Every 5 years for a flexible sigmoidoscopy or every 10 years for a colonoscopy beginning at age 69 and continuing until age 2.  Hepatitis C blood test.** / For all people born from [birth year] through 1963 and any individual with known risks for hepatitis C.  Abdominal aortic aneurysm (AAA) screening.** / A one-time screening for ages 72 to 48 years who are current or former smokers.  Skin self-exam. / Monthly.  Influenza vaccine. / Every year.  Tetanus, diphtheria, and acellular pertussis (Tdap/Td) vaccine.** / 1 dose of Td every 10 years.  Varicella vaccine.** / Consult your health care provider.  Zoster vaccine.** / 1 dose for adults aged 56 years or older.  Pneumococcal 13-valent conjugate (PCV13) vaccine.** / Consult your health care provider.  Pneumococcal polysaccharide (PPSV23)  vaccine.** / 1 dose for all adults aged 78 years and older.  Meningococcal vaccine.** / Consult your health care provider.  Hepatitis A vaccine.** / Consult your health care provider.  Hepatitis B vaccine.** / Consult your health care provider.  Haemophilus influenzae type b (Hib) vaccine.** / Consult your health care provider. **Family history and personal history of risk and conditions may change your health care provider's recommendations. Document Released: 05/06/2001 Document Revised: 03/15/2013 Document Reviewed: 08/05/2010 [facility] Patient Information 2015 [place], [place]. This information is not intended to replace advice given to you by your health care provider. Make sure you discuss any questions you have with your health care provider.

## 2014-05-26 NOTE — Assessment & Plan Note (Signed)
Due for a A1C

## 2014-05-26 NOTE — Progress Notes (Signed)
Subjective:    Patient ID: Joshua Bullock, male    DOB: [birth year], 81 y.o.   MRN: [id number]  DOS:  05/26/2014 Type of visit - description :  Here for Medicare AWV:  1.Risk factors based on Past M, S, F history: reviewed  2. Physical Activities: very active , take walks  3.Depression/mood: (-) screening  4.Hearing: saw the audiologist ~ 2014, thinking about get a hearing aid, but doing so far ok 5.ADL's: totally independent  6.Fall Risk: low risk, see instructions  7.Home Safety: does feel safe  8.Height, weight, &visual acuity: see VS,  has a "leak" in the retina (R), s/p laser sees eye doctor regularly  9. Counseling: yes  10.Labs ordered based on risk factors: yes  11.Referral Coordination, if needed  12.Care Plan, see a/p , written instructions provided  13.Cognitive Assessment: cognition and memory seem appropiate for age  35. Care team updated 15. End of life care discussed, already has a medical power of attorney, rec to get a copy  in addition, we discussed the following issues Hypogonadism-- per endocrinology, good med compliance High chol-- good med compliance w/o apparent s/e Dermatitis-- doing ok w/ OTC hydrocortisone   Review of Systems Constitutional: No fever, chills. No unexplained wt changes. No unusual sweats HEENT: No dental problems, ear discharge, facial swelling, voice changes. No eye discharge, redness or intolerance to light; mild nasal d/c x 1 week, getting better  Respiratory: No wheezing or difficulty breathing. occ dry cough, at baseline Cardiovascular: No CP, leg swelling or palpitations GI: no nausea, vomiting, diarrhea or abdominal pain.  No blood in the stools. No dysphagia   Endocrine: No polyphagia, polyuria or polydipsia GU: No dysuria, gross hematuria, difficulty urinating. No urinary urgency or frequency. Musculoskeletal: No joint swellings or unusual aches or pains Skin: No change in the color of the skin,  palor or rash Allergic, immunologic: No environmental allergies or food allergies Neurological: No dizziness or syncope. No headaches. No diplopia, slurred speech, motor deficits, facial numbness Hematological: No enlarged lymph nodes, easy bruising or bleeding Psychiatry: No suicidal ideas, hallucinations, behavior problems or confusion. No unusual/severe anxiety or depression.     Past Medical History  Diagnosis Date  . Hyperlipidemia   . Arthritis   . COPD (chronic obstructive pulmonary disease) 05/09/2011    Former smoker, fine crackles at bases on exam. PFTs 2012 documented mild-moderate dz    . Hypogonadism male 09/01/2012  . DECREASED HEARING 11/05/2009    Qualifier: Diagnosis of  By: Marijean Niemann CMA, [place]    . OSTEOPENIA 11/15/2007    Per DEXA 2008    . HYPERGLYCEMIA 05/17/2008    Qualifier: Diagnosis of  By: Larose Kells MD, [place]     Past Surgical History  Procedure Laterality Date  . Tonsillectomy  1955    History   Social History  . Marital Status: Married    Spouse Name: N/A  . Number of Children: 1  . Years of Education: N/A   Occupational History  . retired     Social History Main Topics  . Smoking status: Former Research scientist (life sciences)  . Smokeless tobacco: Never Used     Comment: quit 2009  . Alcohol Use: 0.0 oz/week    0 Standard drinks or equivalent per week     Comment: 1-2 a month wine or beer  . Drug Use: No  . Sexual Activity: Not on file   Other Topics Concern  . Not on file   Social History Narrative  Retired from [employer] 2015     Family History  Problem Relation Age of Onset  . Prostate cancer Neg Hx   . Colon cancer Neg Hx   . Hypertension Father   . Arthritis Sister   . Breast cancer Mother   . Hypertension Sister   . CAD Neg Hx        Medication List       This list is accurate as of: 05/26/14  6:04 PM.  Always use your most recent med list.               aspirin 81 MG tablet  Take 81 mg by mouth daily.     azelastine 0.1 % nasal spray    Commonly known as:  ASTELIN  USE 2 SPRAYS IN EACH NOSTRIL 2 TIMES A DAY AS DIRECTED     CALCIUM-VITAMIN D PO  Take 1 each by mouth daily.     hydrocortisone 2.5 % cream  Apply topically 2 (two) times daily.     multivitamin,tx-minerals tablet  Take 1 tablet by mouth daily.     PRESERVISION AREDS 2 Caps  Take 2 capsules by mouth 2 (two) times daily.     pravastatin 40 MG tablet  Commonly known as:  PRAVACHOL  TAKE 1 TABLET BY MOUTH DAILY     Testosterone 20.25 MG/ACT (1.62%) Gel  Commonly known as:  ANDROGEL PUMP  USE 5 PUMPS ON THE SKIN EVERY DAY           Objective:   Physical Exam BP 138/74 mmHg  Pulse 64  Temp(Src) 98.2 F (36.8 C) (Oral)  Ht 5\' 11"  (1.803 m)  Wt 191 lb 8 oz (86.864 kg)  BMI 26.72 kg/m2  SpO2 95% General:   Well developed, well nourished . NAD.  Neck:  Full range of motion. Supple. No  thyromegaly , normal carotid pulse. TMs slt bulge, no red. Throat wnl HEENT:  Normocephalic . Face symmetric, atraumatic Lungs:  Dry crackles at bases otherwise clear Normal respiratory effort, no intercostal retractions, no accessory muscle use. Heart: RRR,  no murmur.  Abdomen:  Not distended, soft, non-tender. No rebound or rigidity. No mass,organomegaly; palpable, not tender abd Ao, no bruit  Muscle skeletal: no pretibial edema bilaterally  Skin: Exposed areas without rash. Not pale. Not jaundice Rectal:  External abnormalities: none. Normal sphincter tone. No rectal masses or tenderness.  Stool brown  Prostate: Prostate gland firm and smooth, no enlargement, nodularity, tenderness, mass, asymmetry or induration.  Neurologic:  alert & oriented X3.  Speech normal, gait appropriate for age and unassisted Strength symmetric and appropriate for age.  Psych: Cognition and judgment appear intact.  Cooperative with normal attention span and concentration.  Behavior appropriate. No anxious or depressed appearing.       Assessment & Plan:     Dermatitis-- on OTC hydrocortisone

## 2014-05-26 NOTE — Assessment & Plan Note (Signed)
Good med compliance , labs  

## 2014-05-29 NOTE — Addendum Note (Signed)
Addended by: Wilfrid Lund on: 05/29/2014 03:41 PM   Modules accepted: Orders

## 2014-06-06 ENCOUNTER — Encounter (HOSPITAL_COMMUNITY): Payer: PRIVATE HEALTH INSURANCE

## 2014-06-08 ENCOUNTER — Other Ambulatory Visit: Payer: Self-pay

## 2014-06-08 DIAGNOSIS — Z136 Encounter for screening for cardiovascular disorders: Secondary | ICD-10-CM

## 2014-06-09 ENCOUNTER — Encounter (HOSPITAL_COMMUNITY): Payer: PRIVATE HEALTH INSURANCE

## 2014-06-09 ENCOUNTER — Ambulatory Visit (HOSPITAL_COMMUNITY): Payer: Medicare Other | Attending: Cardiology | Admitting: Cardiology

## 2014-06-09 DIAGNOSIS — Z136 Encounter for screening for cardiovascular disorders: Secondary | ICD-10-CM

## 2014-06-09 DIAGNOSIS — I700 Atherosclerosis of aorta: Secondary | ICD-10-CM | POA: Diagnosis not present

## 2014-06-09 DIAGNOSIS — R0989 Other specified symptoms and signs involving the circulatory and respiratory systems: Secondary | ICD-10-CM | POA: Diagnosis not present

## 2014-06-09 NOTE — Progress Notes (Signed)
Aorto-iliac duplex performed 

## 2014-06-14 ENCOUNTER — Other Ambulatory Visit (INDEPENDENT_AMBULATORY_CARE_PROVIDER_SITE_OTHER): Payer: Medicare Other

## 2014-06-14 DIAGNOSIS — E871 Hypo-osmolality and hyponatremia: Secondary | ICD-10-CM

## 2014-06-14 LAB — BASIC METABOLIC PANEL
BUN: 10 mg/dL (ref 6–23)
CALCIUM: 9.9 mg/dL (ref 8.4–10.5)
CO2: 31 mEq/L (ref 19–32)
CREATININE: 1.03 mg/dL (ref 0.40–1.50)
Chloride: 99 mEq/L (ref 96–112)
GFR: 73.87 mL/min (ref >60.00)
Glucose, Bld: 109 mg/dL — ABNORMAL HIGH (ref 70–99)
Potassium: 4.3 mEq/L (ref 3.5–5.1)
Sodium: 136 mEq/L (ref 135–145)

## 2014-06-23 ENCOUNTER — Telehealth: Payer: Self-pay | Admitting: Internal Medicine

## 2014-06-23 ENCOUNTER — Inpatient Hospital Stay (HOSPITAL_COMMUNITY)
Admission: EM | Admit: 2014-06-23 | Discharge: 2014-06-25 | DRG: 309 | Disposition: A | Discharge status: Home / Self Care | Payer: Medicare Other | Attending: Internal Medicine | Admitting: Internal Medicine

## 2014-06-23 ENCOUNTER — Encounter (HOSPITAL_COMMUNITY): Payer: Self-pay | Admitting: *Deleted

## 2014-06-23 ENCOUNTER — Emergency Department (HOSPITAL_COMMUNITY): Payer: Medicare Other

## 2014-06-23 ENCOUNTER — Other Ambulatory Visit: Payer: Self-pay | Admitting: Cardiology

## 2014-06-23 DIAGNOSIS — J8410 Pulmonary fibrosis, unspecified: Secondary | ICD-10-CM | POA: Diagnosis not present

## 2014-06-23 DIAGNOSIS — M1990 Unspecified osteoarthritis, unspecified site: Secondary | ICD-10-CM | POA: Diagnosis present

## 2014-06-23 DIAGNOSIS — J449 Chronic obstructive pulmonary disease, unspecified: Secondary | ICD-10-CM | POA: Diagnosis present

## 2014-06-23 DIAGNOSIS — R7309 Other abnormal glucose: Secondary | ICD-10-CM | POA: Diagnosis present

## 2014-06-23 DIAGNOSIS — E230 Hypopituitarism: Secondary | ICD-10-CM | POA: Diagnosis present

## 2014-06-23 DIAGNOSIS — Z87891 Personal history of nicotine dependence: Secondary | ICD-10-CM

## 2014-06-23 DIAGNOSIS — I4891 Unspecified atrial fibrillation: Secondary | ICD-10-CM

## 2014-06-23 DIAGNOSIS — Z79899 Other long term (current) drug therapy: Secondary | ICD-10-CM

## 2014-06-23 DIAGNOSIS — I959 Hypotension, unspecified: Secondary | ICD-10-CM | POA: Diagnosis present

## 2014-06-23 DIAGNOSIS — E291 Testicular hypofunction: Secondary | ICD-10-CM | POA: Diagnosis present

## 2014-06-23 DIAGNOSIS — H9190 Unspecified hearing loss, unspecified ear: Secondary | ICD-10-CM | POA: Diagnosis present

## 2014-06-23 DIAGNOSIS — I5189 Other ill-defined heart diseases: Secondary | ICD-10-CM | POA: Diagnosis present

## 2014-06-23 DIAGNOSIS — R0989 Other specified symptoms and signs involving the circulatory and respiratory systems: Secondary | ICD-10-CM

## 2014-06-23 DIAGNOSIS — N289 Disorder of kidney and ureter, unspecified: Secondary | ICD-10-CM

## 2014-06-23 DIAGNOSIS — R7989 Other specified abnormal findings of blood chemistry: Secondary | ICD-10-CM | POA: Diagnosis present

## 2014-06-23 DIAGNOSIS — J438 Other emphysema: Secondary | ICD-10-CM | POA: Diagnosis not present

## 2014-06-23 DIAGNOSIS — E785 Hyperlipidemia, unspecified: Secondary | ICD-10-CM | POA: Diagnosis present

## 2014-06-23 DIAGNOSIS — Z7982 Long term (current) use of aspirin: Secondary | ICD-10-CM

## 2014-06-23 DIAGNOSIS — J42 Unspecified chronic bronchitis: Secondary | ICD-10-CM

## 2014-06-23 DIAGNOSIS — G4733 Obstructive sleep apnea (adult) (pediatric): Secondary | ICD-10-CM | POA: Diagnosis present

## 2014-06-23 DIAGNOSIS — D72829 Elevated white blood cell count, unspecified: Secondary | ICD-10-CM | POA: Diagnosis present

## 2014-06-23 DIAGNOSIS — I9589 Other hypotension: Secondary | ICD-10-CM | POA: Diagnosis not present

## 2014-06-23 DIAGNOSIS — R7303 Prediabetes: Secondary | ICD-10-CM | POA: Diagnosis present

## 2014-06-23 DIAGNOSIS — R531 Weakness: Secondary | ICD-10-CM | POA: Diagnosis not present

## 2014-06-23 DIAGNOSIS — G4730 Sleep apnea, unspecified: Secondary | ICD-10-CM

## 2014-06-23 DIAGNOSIS — R778 Other specified abnormalities of plasma proteins: Secondary | ICD-10-CM | POA: Diagnosis present

## 2014-06-23 LAB — BASIC METABOLIC PANEL
Anion gap: 5 (ref 5–15)
BUN: 14 mg/dL (ref 6–23)
CHLORIDE: 101 mmol/L (ref 96–112)
CO2: 26 mmol/L (ref 19–32)
CREATININE: 1.13 mg/dL (ref 0.50–1.35)
Calcium: 8.7 mg/dL (ref 8.4–10.5)
GFR calc non Af Amer: 60 mL/min — ABNORMAL LOW (ref >90)
GFR, EST AFRICAN AMERICAN: 69 mL/min — AB (ref >90)
Glucose, Bld: 112 mg/dL — ABNORMAL HIGH (ref 70–99)
Potassium: 4.6 mmol/L (ref 3.5–5.1)
Sodium: 132 mmol/L — ABNORMAL LOW (ref 135–145)

## 2014-06-23 LAB — URINALYSIS, ROUTINE W REFLEX MICROSCOPIC
Bilirubin Urine: NEGATIVE
Glucose, UA: NEGATIVE mg/dL
Hgb urine dipstick: NEGATIVE
Ketones, ur: 15 mg/dL — AB
Leukocytes, UA: NEGATIVE
Nitrite: NEGATIVE
PROTEIN: 30 mg/dL — AB
Specific Gravity, Urine: 1.018 (ref 1.005–1.030)
Urobilinogen, UA: 0.2 mg/dL (ref 0.0–1.0)
pH: 6.5 (ref 5.0–8.0)

## 2014-06-23 LAB — CBC
HCT: 44.4 % (ref 39.0–52.0)
Hemoglobin: 15.4 g/dL (ref 13.0–17.0)
MCH: 31.6 pg (ref 26.0–34.0)
MCHC: 34.7 g/dL (ref 30.0–36.0)
MCV: 91.2 fL (ref 78.0–100.0)
PLATELETS: 192 10*3/uL (ref 150–400)
RBC: 4.87 MIL/uL (ref 4.22–5.81)
RDW: 13.5 % (ref 11.5–15.5)
WBC: 13.8 10*3/uL — AB (ref 4.0–10.5)

## 2014-06-23 LAB — TSH: TSH: 1.862 u[IU]/mL (ref 0.350–4.500)

## 2014-06-23 LAB — URINE MICROSCOPIC-ADD ON

## 2014-06-23 LAB — MRSA PCR SCREENING: MRSA BY PCR: NEGATIVE

## 2014-06-23 LAB — TROPONIN I
TROPONIN I: 0.08 ng/mL — AB (ref <0.031)
Troponin I: 0.06 ng/mL — ABNORMAL HIGH (ref <0.031)
Troponin I: 0.07 ng/mL — ABNORMAL HIGH (ref <0.031)

## 2014-06-23 LAB — PROTIME-INR
INR: 1.22 (ref 0.00–1.49)
Prothrombin Time: 15.6 seconds — ABNORMAL HIGH (ref 11.6–15.2)

## 2014-06-23 LAB — I-STAT TROPONIN, ED: Troponin i, poc: 0.04 ng/mL (ref 0.00–0.08)

## 2014-06-23 LAB — MAGNESIUM: MAGNESIUM: 2.1 mg/dL (ref 1.5–2.5)

## 2014-06-23 MED ORDER — GUAIFENESIN-DM 100-10 MG/5ML PO SYRP: 5.0000 mL | ORAL_SOLUTION | ORAL | Status: DC | PRN

## 2014-06-23 MED ORDER — METOPROLOL TARTRATE 25 MG PO TABS
50.0000 mg | ORAL_TABLET | Freq: Two times a day (BID) | ORAL | Status: DC
  Administered 2014-06-23: 50 mg via ORAL
  Filled 2014-06-23: qty 2

## 2014-06-23 MED ORDER — SODIUM CHLORIDE 0.9 % IV BOLUS (SEPSIS)
1000.0000 mL | Freq: Once | INTRAVENOUS | Status: AC
  Administered 2014-06-23: 1000 mL via INTRAVENOUS

## 2014-06-23 MED ORDER — HYDROCODONE-ACETAMINOPHEN 5-325 MG PO TABS: 1.0000 | ORAL_TABLET | Freq: Four times a day (QID) | ORAL | Status: DC | PRN

## 2014-06-23 MED ORDER — AZELASTINE HCL 0.1 % NA SOLN
2.0000 | Freq: Two times a day (BID) | NASAL | Status: DC
  Filled 2014-06-23: qty 30

## 2014-06-23 MED ORDER — ALUM & MAG HYDROXIDE-SIMETH 200-200-20 MG/5ML PO SUSP: 30.0000 mL | Freq: Four times a day (QID) | ORAL | Status: DC | PRN

## 2014-06-23 MED ORDER — ONDANSETRON HCL 4 MG PO TABS: 4.0000 mg | ORAL_TABLET | Freq: Four times a day (QID) | ORAL | Status: DC | PRN

## 2014-06-23 MED ORDER — SODIUM CHLORIDE 0.9 % IV SOLN
INTRAVENOUS | Status: DC
  Administered 2014-06-23: 17:00:00 via INTRAVENOUS

## 2014-06-23 MED ORDER — APIXABAN 5 MG PO TABS: 5.0000 mg | ORAL_TABLET | Freq: Two times a day (BID) | ORAL | Status: DC

## 2014-06-23 MED ORDER — PHENYLEPHRINE HCL 10 MG/ML IJ SOLN
0.0000 ug/min | INTRAMUSCULAR | Status: DC
  Administered 2014-06-23: 60 ug/min via INTRAVENOUS
  Administered 2014-06-23: 20 ug/min via INTRAVENOUS
  Filled 2014-06-23 (×3): qty 1

## 2014-06-23 MED ORDER — ADULT MULTIVITAMIN W/MINERALS CH
1.0000 | ORAL_TABLET | Freq: Every day | ORAL | Status: DC
  Administered 2014-06-24 – 2014-06-25 (×2): 1 via ORAL
  Filled 2014-06-23 (×2): qty 1

## 2014-06-23 MED ORDER — APIXABAN 5 MG PO TABS
5.0000 mg | ORAL_TABLET | Freq: Two times a day (BID) | ORAL | Status: DC
  Administered 2014-06-23 – 2014-06-25 (×4): 5 mg via ORAL
  Filled 2014-06-23 (×6): qty 1

## 2014-06-23 MED ORDER — FLECAINIDE ACETATE 100 MG PO TABS
300.0000 mg | ORAL_TABLET | Freq: Once | ORAL | Status: AC
  Administered 2014-06-23: 300 mg via ORAL
  Filled 2014-06-23: qty 3

## 2014-06-23 MED ORDER — DILTIAZEM HCL 100 MG IV SOLR
5.0000 mg/h | Freq: Once | INTRAVENOUS | Status: AC
  Administered 2014-06-23: 5 mg/h via INTRAVENOUS

## 2014-06-23 MED ORDER — POLYETHYLENE GLYCOL 3350 17 G PO PACK: 17.0000 g | PACK | Freq: Every day | ORAL | Status: DC | PRN

## 2014-06-23 MED ORDER — PRAVASTATIN SODIUM 40 MG PO TABS
40.0000 mg | ORAL_TABLET | Freq: Every day | ORAL | Status: DC
  Administered 2014-06-23 – 2014-06-24 (×2): 40 mg via ORAL
  Filled 2014-06-23 (×3): qty 1

## 2014-06-23 MED ORDER — SODIUM CHLORIDE 0.9 % IJ SOLN
3.0000 mL | Freq: Two times a day (BID) | INTRAMUSCULAR | Status: DC
  Administered 2014-06-23 – 2014-06-24 (×3): 3 mL via INTRAVENOUS

## 2014-06-23 MED ORDER — ONDANSETRON HCL 4 MG/2ML IJ SOLN: 4.0000 mg | Freq: Four times a day (QID) | INTRAMUSCULAR | Status: DC | PRN

## 2014-06-23 MED ORDER — SUPER HIGH VITAMINS/MINERALS PO TABS: 1.0000 | ORAL_TABLET | Freq: Every day | ORAL | Status: DC

## 2014-06-23 MED ORDER — DILTIAZEM HCL 25 MG/5ML IV SOLN
20.0000 mg | Freq: Once | INTRAVENOUS | Status: AC
  Administered 2014-06-23: 20 mg via INTRAVENOUS

## 2014-06-23 MED ORDER — DILTIAZEM HCL 60 MG PO TABS
60.0000 mg | ORAL_TABLET | Freq: Four times a day (QID) | ORAL | Status: DC
  Administered 2014-06-24: 60 mg via ORAL
  Filled 2014-06-23 (×7): qty 1

## 2014-06-23 MED ORDER — ASPIRIN 81 MG PO CHEW
81.0000 mg | CHEWABLE_TABLET | Freq: Every day | ORAL | Status: DC
  Administered 2014-06-23: 81 mg via ORAL
  Filled 2014-06-23: qty 1

## 2014-06-23 NOTE — ED Notes (Signed)
Pharmacy contacted for neo-synephrine; will send soon

## 2014-06-23 NOTE — ED Provider Notes (Signed)
CSN: [id number]     Arrival date & time 06/23/14  1437 History   First MD Initiated Contact with Patient 06/23/14 1441     Chief Complaint  Patient presents with  . Weakness     (Consider location/radiation/quality/duration/timing/severity/associated sxs/prior Treatment) Patient is a 81 y.o. male presenting with weakness. The history is provided by the patient.  Weakness This is a new problem. The current episode started 2 days ago. The problem occurs constantly. The problem has not changed since onset.Pertinent negatives include no abdominal pain and no shortness of breath. Nothing aggravates the symptoms. Nothing relieves the symptoms. He has tried nothing for the symptoms. The treatment provided no relief.    Past Medical History  Diagnosis Date  . Hyperlipidemia   . Arthritis   . COPD (chronic obstructive pulmonary disease) 05/09/2011    Former smoker, fine crackles at bases on exam. PFTs 2012 documented mild-moderate dz    . Hypogonadism male 09/01/2012  . DECREASED HEARING 11/05/2009    Qualifier: Diagnosis of  By: Marijean Niemann CMA, Danielle    . OSTEOPENIA 11/15/2007    Per DEXA 2008    . HYPERGLYCEMIA 05/17/2008    Qualifier: Diagnosis of  By: Larose Kells MD, [place]    Past Surgical History  Procedure Laterality Date  . Tonsillectomy  1955   Family History  Problem Relation Age of Onset  . Prostate cancer Neg Hx   . Colon cancer Neg Hx   . Hypertension Father   . Arthritis Sister   . Breast cancer Mother   . Hypertension Sister   . CAD Neg Hx    History  Substance Use Topics  . Smoking status: Former Research scientist (life sciences)  . Smokeless tobacco: Never Used     Comment: quit 2009  . Alcohol Use: 0.0 oz/week    0 Standard drinks or equivalent per week     Comment: 1-2 a month wine or beer    Review of Systems  Constitutional: Positive for fatigue. Negative for fever.  Respiratory: Negative for cough and shortness of breath.   Gastrointestinal: Negative for vomiting and abdominal pain.   Neurological: Positive for weakness. Negative for dizziness.  All other systems reviewed and are negative.     Allergies  Influenza virus vacc split pf and Tetanus toxoid  Home Medications   Prior to Admission medications   Medication Sig Start Date End Date Taking? Authorizing Provider  aspirin 81 MG tablet Take 81 mg by mouth daily.     Yes Historical Provider, MD  azelastine (ASTELIN) 0.1 % nasal spray USE 2 SPRAYS IN EACH NOSTRIL 2 TIMES A DAY AS DIRECTED 03/06/14  Yes Colon Branch, MD  CALCIUM-VITAMIN D PO Take 1 each by mouth daily.     Yes Historical Provider, MD  fexofenadine (ALLEGRA) 180 MG tablet Take 180 mg by mouth daily as needed for allergies or rhinitis.   Yes Historical Provider, MD  Multiple Vitamins-Minerals (MULTIVITAMIN,TX-MINERALS) tablet Take 1 tablet by mouth daily.     Yes Historical Provider, MD  Multiple Vitamins-Minerals (PRESERVISION AREDS 2) CAPS Take 2 capsules by mouth 2 (two) times daily.   Yes Historical Provider, MD  pravastatin (PRAVACHOL) 40 MG tablet TAKE 1 TABLET BY MOUTH DAILY 03/21/14  Yes Colon Branch, MD  Testosterone (ANDROGEL PUMP) 20.25 MG/ACT (1.62%) GEL USE 5 PUMPS ON THE SKIN EVERY DAY 04/05/14  Yes Renato Shin, MD  hydrocortisone 2.5 % cream Apply topically 2 (two) times daily. Patient not taking: Reported on 05/26/2014 05/23/13  Colon Branch, MD   BP 105/85 mmHg  Pulse 152  Temp(Src) 98.4 F (36.9 C) (Oral)  Resp 21  Ht 5\' 11"  (1.803 m)  Wt 194 lb (87.998 kg)  BMI 27.07 kg/m2  SpO2 95% Physical Exam  Constitutional: He is oriented to person, place, and time. He appears well-developed and well-nourished. No distress.  HENT:  Head: Normocephalic and atraumatic.  Mouth/Throat: No oropharyngeal exudate.  Eyes: EOM are normal. Pupils are equal, round, and reactive to light.  Neck: Normal range of motion. Neck supple.  Cardiovascular: An irregularly irregular rhythm present. Tachycardia present.  Exam reveals no friction rub.   No murmur  heard. Pulmonary/Chest: Effort normal and breath sounds normal. No respiratory distress. He has no wheezes. He has no rales.  Abdominal: He exhibits no distension. There is no tenderness. There is no rebound.  Musculoskeletal: Normal range of motion. He exhibits no edema.  Neurological: He is alert and oriented to person, place, and time.  Skin: He is not diaphoretic.    ED Course  Procedures (including critical care time) Labs Review Labs Reviewed  BASIC METABOLIC PANEL  CBC  TROPONIN I  MAGNESIUM  I-STAT [place], ED    Imaging Review Dg Chest Port 1 View  06/23/2014   CLINICAL DATA:  WEEK AND CONFUSED SINCE YESTERDAY.  EXAM: PORTABLE CHEST - 1 VIEW  COMPARISON:  11/06/2009  FINDINGS: Midline trachea. Hyperinflation. Normal heart size and mediastinal contours. Patient rotated minimally right. No pleural effusion or pneumothorax. Lower lobe predominant interstitial thickening is moderate. No lobar consolidation. Mild scarring at the left lung base.  IMPRESSION: COPD/ chronic bronchitis, without acute superimposed process.   Electronically Signed   By: Abigail Miyamoto M.D.   On: 06/23/2014 15:08     EKG Interpretation   Date/Time:  Friday June 23 2014 14:39:15 EDT Ventricular Rate:  145 PR Interval:    QRS Duration: 96 QT Interval:  306 QTC Calculation: 475 R Axis:   81 Text Interpretation:  Atrial fibrillation with rapid ventricular response  Incomplete right bundle branch block Abnormal ECG No prior for comparison  Confirmed by Mingo Amber  MD, Macon Sandiford ([id number]) on 06/23/2014 2:41:59 PM     CRITICAL CARE Performed by: Osvaldo Shipper   Total critical care time: 30 minutes   Critical care time was exclusive of separately billable procedures and treating other patients.  Critical care was necessary to treat or prevent imminent or life-threatening deterioration.  Critical care was time spent personally by me on the following activities: development of treatment plan with  patient and/or surrogate as well as nursing, discussions with consultants, evaluation of patient's response to treatment, examination of patient, obtaining history from patient or surrogate, ordering and performing treatments and interventions, ordering and review of laboratory studies, ordering and review of radiographic studies, pulse oximetry and re-evaluation of patient's condition.  MDM   Final diagnoses:  Atrial fibrillation with RVR    31M here with weakness, present over past couple of days. Denies chest pain, shortness of breath. Denies dizziness. With EMS was in A. fib with RVR. Unknown true onset. No history of A. Fib. Dilt given in the ambulance without relief. Dilt gtt started here with another bolus given.  HR improved, still in Afib, but rate controlled with Dilt drip. Plan on admission.    Evelina Bucy, MD 06/23/14 [phone]

## 2014-06-23 NOTE — Progress Notes (Deleted)
Reason for Consult: atrial fib   Referring Physician: ER MD   PCP:  [name], MD  Primary Cardiologist: New  Joshua Bullock is an 81 y.o. male.    Chief Complaint: generalized weakness and diaphoresis for 2 days.  Found to be in A fib with RVR, HR 140-160.      HPI: 81 year old male with hx COPD, HLD, hypogonadism presents today with weakness and diaphoresis for 2 days.  Found to be in A fib with RVR.  Has rec'd Dilt. And is on drip and fluid bolus.  Now with improved HR no chest pain no SOB.  Troponin mildly elevated will monitor.  No hx of cardiac issues.  No awareness of rapid HR.    Past Medical History  Diagnosis Date  . Hyperlipidemia   . Arthritis   . COPD (chronic obstructive pulmonary disease) 05/09/2011    Former smoker, fine crackles at bases on exam. PFTs 2012 documented mild-moderate dz    . Hypogonadism male 09/01/2012  . DECREASED HEARING 11/05/2009    Qualifier: Diagnosis of  By: Marijean Niemann CMA, Danielle    . OSTEOPENIA 11/15/2007    Per DEXA 2008    . HYPERGLYCEMIA 05/17/2008    Qualifier: Diagnosis of  By: Larose Kells MD, [place]     Past Surgical History  Procedure Laterality Date  . Tonsillectomy  1955    Family History  Problem Relation Age of Onset  . Prostate cancer Neg Hx   . Colon cancer Neg Hx   . Hypertension Father   . Arthritis Sister   . Breast cancer Mother   . Hypertension Sister   . CAD Neg Hx    Social History:  reports that he has quit smoking. He has never used smokeless tobacco. He reports that he drinks alcohol. He reports that he does not use illicit drugs.  Allergies:  Allergies  Allergen Reactions  . Influenza Virus Vacc Split Pf Other (See Comments)    Hospitalized with temperatures above 103 and classic symptoms of flu  . Tetanus Toxoid Hives    Outpatient Meds: No current facility-administered medications on file prior to encounter.   Current Outpatient Prescriptions on File Prior to Encounter  Medication Sig  Dispense Refill  . aspirin 81 MG tablet Take 81 mg by mouth daily.      Marland Kitchen azelastine (ASTELIN) 0.1 % nasal spray USE 2 SPRAYS IN EACH NOSTRIL 2 TIMES A DAY AS DIRECTED 30 mL 3  . CALCIUM-VITAMIN D PO Take 1 each by mouth daily.      . Multiple Vitamins-Minerals (MULTIVITAMIN,TX-MINERALS) tablet Take 1 tablet by mouth daily.      . Multiple Vitamins-Minerals (PRESERVISION AREDS 2) CAPS Take 2 capsules by mouth 2 (two) times daily.    . pravastatin (PRAVACHOL) 40 MG tablet TAKE 1 TABLET BY MOUTH DAILY 90 tablet 1  . Testosterone (ANDROGEL PUMP) 20.25 MG/ACT (1.62%) GEL USE 5 PUMPS ON THE SKIN EVERY DAY 225 g 5  . hydrocortisone 2.5 % cream Apply topically 2 (two) times daily. (Patient not taking: Reported on 05/26/2014) 30 g 0     Results for orders placed or performed during the hospital encounter of 06/23/14 (from the past 48 hour(s))  Basic metabolic panel     Status: Abnormal   Collection Time: 06/23/14  3:00 PM  Result Value Ref Range   Sodium 132 (L) 135 - 145 mmol/L   Potassium 4.6 3.5 - 5.1 mmol/L  Chloride 101 96 - 112 mmol/L   CO2 26 19 - 32 mmol/L   Glucose, Bld 112 (H) 70 - 99 mg/dL   BUN 14 6 - 23 mg/dL   Creatinine, Ser 1.13 0.50 - 1.35 mg/dL   Calcium 8.7 8.4 - 10.5 mg/dL   GFR calc non Af Amer 60 (L) >90 mL/min   GFR calc Af Amer 69 (L) >90 mL/min    Comment: (NOTE) The eGFR has been calculated using the CKD EPI equation. This calculation has not been validated in all clinical situations. eGFR's persistently <90 mL/min signify possible Chronic Kidney Disease.    Anion gap 5 5 - 15  CBC     Status: Abnormal   Collection Time: 06/23/14  3:00 PM  Result Value Ref Range   WBC 13.8 (H) 4.0 - 10.5 K/uL   RBC 4.87 4.22 - 5.81 MIL/uL   Hemoglobin 15.4 13.0 - 17.0 g/dL   HCT 44.4 39.0 - 52.0 %   MCV 91.2 78.0 - 100.0 fL   MCH 31.6 26.0 - 34.0 pg   MCHC 34.7 30.0 - 36.0 g/dL   RDW 13.5 11.5 - 15.5 %   Platelets 192 150 - 400 K/uL  Troponin I     Status: Abnormal    Collection Time: 06/23/14  3:00 PM  Result Value Ref Range   Troponin I 0.06 (H) <0.031 ng/mL    Comment:        PERSISTENTLY INCREASED TROPONIN VALUES IN THE RANGE OF 0.04-0.49 ng/mL CAN BE SEEN IN:       -UNSTABLE ANGINA       -CONGESTIVE HEART FAILURE       -MYOCARDITIS       -CHEST TRAUMA       -ARRYHTHMIAS       -LATE PRESENTING MYOCARDIAL INFARCTION       -COPD   CLINICAL FOLLOW-UP RECOMMENDED.   Magnesium     Status: None   Collection Time: 06/23/14  3:00 PM  Result Value Ref Range   Magnesium 2.1 1.5 - 2.5 mg/dL  I-stat troponin, ED (do not order at [facility])     Status: None   Collection Time: 06/23/14  3:14 PM  Result Value Ref Range   Troponin i, poc 0.04 0.00 - 0.08 ng/mL   Comment 3            Comment: Due to the release kinetics of cTnI, a negative result within the first hours of the onset of symptoms does not rule out myocardial infarction with certainty. If myocardial infarction is still suspected, repeat the test at appropriate intervals.   Protime-INR     Status: Abnormal   Collection Time: 06/23/14  3:38 PM  Result Value Ref Range   Prothrombin Time 15.6 (H) 11.6 - 15.2 seconds   INR 1.22 0.00 - 1.49   Dg Chest Port 1 View  06/23/2014   CLINICAL DATA:  WEEK AND CONFUSED SINCE YESTERDAY.  EXAM: PORTABLE CHEST - 1 VIEW  COMPARISON:  11/06/2009  FINDINGS: Midline trachea. Hyperinflation. Normal heart size and mediastinal contours. Patient rotated minimally right. No pleural effusion or pneumothorax. Lower lobe predominant interstitial thickening is moderate. No lobar consolidation. Mild scarring at the left lung base.  IMPRESSION: COPD/ chronic bronchitis, without acute superimposed process.   Electronically Signed   By: Abigail Miyamoto M.D.   On: 06/23/2014 15:08    ROS: General:no colds or fevers, no weight changes Skin:no rashes or ulcers HEENT:no blurred vision, no congestion CV:see HPI  PUL:see HPI GI:no diarrhea constipation or melena, no indigestion GU:no  hematuria, no dysuria MS:no joint pain, no claudication Neuro:no syncope, + lightheadedness Endo:no diabetes, no thyroid disease   Blood pressure 120/97, pulse 113, temperature 98.4 F (36.9 C), temperature source Oral, resp. rate 16, height $RemoveBe'5\' 11"'CKSCcfAQI$  (1.803 m), weight 194 lb (87.998 kg), SpO2 98 %.  Wt Readings from Last 3 Encounters:  06/23/14 194 lb (87.998 kg)  05/26/14 191 lb 8 oz (86.864 kg)  01/04/14 198 lb (89.812 kg)    PE: General:Pleasant affect, NAD Skin:Warm and dry, brisk capillary refill HEENT:normocephalic, sclera clear, mucus membranes moist Neck:supple, no JVD, no bruits  Heart:S1S2 irreg irreg without murmur, gallup, rub or click Lungs:clear without rales, rhonchi, or wheezes FUQ:XAFH, non tender, + BS, do not palpate liver spleen or masses Ext:no lower ext edema, 2+ pedal pulses, 2+ radial pulses, jerking movements of extremities. Neuro:alert and oriented X 3, MAE, follows commands, + facial symmetry    Assessment/Plan Principal Problem:   New onset a-fib- with RVR now on dilt drip and rec'd IV fluids.  CHAD2S2Vasc score 2 Eliquis has been added. Check echo will control rate - pt admitted.  Possible DCCV if remains in a fib.  Active Problems:   Abnormal troponin, may be demand ischemia, follow them   Hyperlipidemia   Prediabetes   COPD (chronic obstructive pulmonary disease)   Pituitary insufficiency   Atrial fibrillation    [facility] R  Nurse Practitioner Certified [place] Pager [phone] or after 5pm or weekends call [phone] 06/23/2014, 5:07 PM

## 2014-06-23 NOTE — ED Notes (Addendum)
Flecainide to be given prior to admission; if converted, pt to be discharged, per Dr.Bensimhon

## 2014-06-23 NOTE — Progress Notes (Signed)
ANTICOAGULATION CONSULT NOTE - Initial Consult  Pharmacy Consult for apixaban Indication: atrial fibrillation  Allergies  Allergen Reactions  . Influenza Virus Vacc Split Pf Other (See Comments)    Hospitalized with temperatures above 103 and classic symptoms of flu  . Tetanus Toxoid Hives    Patient Measurements: Height: 5\' 11"  (180.3 cm) Weight: 194 lb (87.998 kg) IBW/kg (Calculated) : 75.3  Vital Signs: Temp: 98.4 F (36.9 C) (04/01 1453) Temp Source: Oral (04/01 1453) BP: 120/97 mmHg (04/01 1700) Pulse Rate: 132 (04/01 1712)  Labs:  Recent Labs  06/23/14 1500 06/23/14 1538 06/23/14 1637  HGB 15.4  --   --   HCT 44.4  --   --   PLT 192  --   --   LABPROT  --  15.6*  --   INR  --  1.22  --   CREATININE 1.13  --   --   TROPONINI 0.06*  --  0.08*    Estimated Creatinine Clearance: 56.5 mL/min (by C-G formula based on Cr of 1.13).   Medical History: Past Medical History  Diagnosis Date  . Hyperlipidemia   . Arthritis   . COPD (chronic obstructive pulmonary disease) 05/09/2011    Former smoker, fine crackles at bases on exam. PFTs 2012 documented mild-moderate dz    . Hypogonadism male 09/01/2012  . DECREASED HEARING 11/05/2009    Qualifier: Diagnosis of  By: Marijean Niemann CMA, Danielle    . OSTEOPENIA 11/15/2007    Per DEXA 2008    . HYPERGLYCEMIA 05/17/2008    Qualifier: Diagnosis of  By: Larose Kells MD, [place]     Assessment: 21 yoM who presents from his PCP with generalized weakness and diaphoresis, found to be in A fib with RVR. Denies chest pain, troponins mildly elevated. CHA2DS2VASc =2. Pharmacy was consulted to dose Eliquis. H/H stable, platelets 192, SCr 1.13 (CrCl ~55-60 ml/min), Wt 88 kg.  Goal of Therapy:  Monitor platelets by anticoagulation protocol: Yes   Plan:  Eliquis 5 mg po BID Monitor for s/sx of bleeding Monitor CBC  Whitney Muse D 06/23/2014,5:31 PM

## 2014-06-23 NOTE — Consult Note (Signed)
CARDIOLOGY CONSULT NOTE   Reason for Consult: atrial fib   Referring Physician: ER MD   PCP:  [name], MD  Primary Cardiologist: New  Joshua Bullock is an 81 y.o. male.    Chief Complaint: generalized weakness and diaphoresis for 2 days.  Found to be in A fib with RVR, HR 140-160.      HPI: 81 year old male with hx COPD, HLD, hypogonadism presents today with weakness and diaphoresis for 2 days.    Denies any h/o AF or known heart disease.  Was feeling fine on Wednesday active, able to mow the lawn. woke up yesterday at 5am with palpitations and feeling weak. Persisted all day. Today continued to feel weak. Went to PCP and found to be in AF with RVR. Sent to ER. In ER, ECG with AF with v-rate of 145. No ST-T changes. Troponin 0.08. Denied CP or HF symptoms. Started on diltiazem gtt and dropped rate down to about 100. Then given 50 mg of po lopressor and HR dropped into 40s transiently and SBP into 70s. Diltiazem gtt stopped.   Wife says he snores very heavily and frequently stops breathing at night. + excessive daytime sleepiness.   Past Medical History  Diagnosis Date  . Hyperlipidemia   . Arthritis   . COPD (chronic obstructive pulmonary disease) 05/09/2011    Former smoker, fine crackles at bases on exam. PFTs 2012 documented mild-moderate dz    . Hypogonadism male 09/01/2012  . DECREASED HEARING 11/05/2009    Qualifier: Diagnosis of  By: Marijean Niemann CMA, Danielle    . OSTEOPENIA 11/15/2007    Per DEXA 2008    . HYPERGLYCEMIA 05/17/2008    Qualifier: Diagnosis of  By: Larose Kells MD, [place]     Past Surgical History  Procedure Laterality Date  . Tonsillectomy  1955    Family History  Problem Relation Age of Onset  . Prostate cancer Neg Hx   . Colon cancer Neg Hx   . Hypertension Father   . Arthritis Sister   . Breast cancer Mother   . Hypertension Sister   . CAD Neg Hx    Social History:  reports that he has quit smoking. He has never used smokeless tobacco. He  reports that he drinks alcohol. He reports that he does not use illicit drugs.  Allergies:  Allergies  Allergen Reactions  . Influenza Virus Vacc Split Pf Other (See Comments)    Hospitalized with temperatures above 103 and classic symptoms of flu  . Tetanus Toxoid Hives    Outpatient Meds: No current facility-administered medications on file prior to encounter.   Current Outpatient Prescriptions on File Prior to Encounter  Medication Sig Dispense Refill  . aspirin 81 MG tablet Take 81 mg by mouth daily.      Marland Kitchen azelastine (ASTELIN) 0.1 % nasal spray USE 2 SPRAYS IN EACH NOSTRIL 2 TIMES A DAY AS DIRECTED 30 mL 3  . CALCIUM-VITAMIN D PO Take 1 each by mouth daily.      . Multiple Vitamins-Minerals (MULTIVITAMIN,TX-MINERALS) tablet Take 1 tablet by mouth daily.      . Multiple Vitamins-Minerals (PRESERVISION AREDS 2) CAPS Take 2 capsules by mouth 2 (two) times daily.    . pravastatin (PRAVACHOL) 40 MG tablet TAKE 1 TABLET BY MOUTH DAILY 90 tablet 1  . Testosterone (ANDROGEL PUMP) 20.25 MG/ACT (1.62%) GEL USE 5 PUMPS ON THE SKIN EVERY DAY 225 g 5  . hydrocortisone 2.5 % cream Apply topically 2 (  two) times daily. (Patient not taking: Reported on 05/26/2014) 30 g 0     Results for orders placed or performed during the hospital encounter of 06/23/14 (from the past 48 hour(s))  Basic metabolic panel     Status: Abnormal   Collection Time: 06/23/14  3:00 PM  Result Value Ref Range   Sodium 132 (L) 135 - 145 mmol/L   Potassium 4.6 3.5 - 5.1 mmol/L   Chloride 101 96 - 112 mmol/L   CO2 26 19 - 32 mmol/L   Glucose, Bld 112 (H) 70 - 99 mg/dL   BUN 14 6 - 23 mg/dL   Creatinine, Ser 1.13 0.50 - 1.35 mg/dL   Calcium 8.7 8.4 - 10.5 mg/dL   GFR calc non Af Amer 60 (L) >90 mL/min   GFR calc Af Amer 69 (L) >90 mL/min    Comment: (NOTE) The eGFR has been calculated using the CKD EPI equation. This calculation has not been validated in all clinical situations. eGFR's persistently <90 mL/min signify  possible Chronic Kidney Disease.    Anion gap 5 5 - 15  CBC     Status: Abnormal   Collection Time: 06/23/14  3:00 PM  Result Value Ref Range   WBC 13.8 (H) 4.0 - 10.5 K/uL   RBC 4.87 4.22 - 5.81 MIL/uL   Hemoglobin 15.4 13.0 - 17.0 g/dL   HCT 44.4 39.0 - 52.0 %   MCV 91.2 78.0 - 100.0 fL   MCH 31.6 26.0 - 34.0 pg   MCHC 34.7 30.0 - 36.0 g/dL   RDW 13.5 11.5 - 15.5 %   Platelets 192 150 - 400 K/uL  Troponin I     Status: Abnormal   Collection Time: 06/23/14  3:00 PM  Result Value Ref Range   Troponin I 0.06 (H) <0.031 ng/mL    Comment:        PERSISTENTLY INCREASED TROPONIN VALUES IN THE RANGE OF 0.04-0.49 ng/mL CAN BE SEEN IN:       -UNSTABLE ANGINA       -CONGESTIVE HEART FAILURE       -MYOCARDITIS       -CHEST TRAUMA       -ARRYHTHMIAS       -LATE PRESENTING MYOCARDIAL INFARCTION       -COPD   CLINICAL FOLLOW-UP RECOMMENDED.   Magnesium     Status: None   Collection Time: 06/23/14  3:00 PM  Result Value Ref Range   Magnesium 2.1 1.5 - 2.5 mg/dL  I-stat troponin, ED (do not order at [facility])     Status: None   Collection Time: 06/23/14  3:14 PM  Result Value Ref Range   Troponin i, poc 0.04 0.00 - 0.08 ng/mL   Comment 3            Comment: Due to the release kinetics of cTnI, a negative result within the first hours of the onset of symptoms does not rule out myocardial infarction with certainty. If myocardial infarction is still suspected, repeat the test at appropriate intervals.   Protime-INR     Status: Abnormal   Collection Time: 06/23/14  3:38 PM  Result Value Ref Range   Prothrombin Time 15.6 (H) 11.6 - 15.2 seconds   INR 1.22 0.00 - 1.49   Dg Chest Port 1 View  06/23/2014   CLINICAL DATA:  WEEK AND CONFUSED SINCE YESTERDAY.  EXAM: PORTABLE CHEST - 1 VIEW  COMPARISON:  11/06/2009  FINDINGS: Midline trachea. Hyperinflation. Normal heart size and  mediastinal contours. Patient rotated minimally right. No pleural effusion or pneumothorax. Lower lobe predominant  interstitial thickening is moderate. No lobar consolidation. Mild scarring at the left lung base.  IMPRESSION: COPD/ chronic bronchitis, without acute superimposed process.   Electronically Signed   By: Abigail Miyamoto M.D.   On: 06/23/2014 15:08    ROS: General:no colds or fevers, no weight changes Skin:no rashes or ulcers HEENT:no blurred vision, no congestion CV:see HPI PUL:see HPI GI:no diarrhea constipation or melena, no indigestion GU:no hematuria, no dysuria MS:no joint pain, no claudication Neuro:no syncope, + lightheadedness Endo:no diabetes, no thyroid disease   Blood pressure 120/97, pulse 113, temperature 98.4 F (36.9 C), temperature source Oral, resp. rate 16, height $RemoveBe'5\' 11"'QSZoKnIPH$  (1.803 m), weight 194 lb (87.998 kg), SpO2 98 %.  Wt Readings from Last 3 Encounters:  06/23/14 194 lb (87.998 kg)  05/26/14 191 lb 8 oz (86.864 kg)  01/04/14 198 lb (89.812 kg)    PE: General:Pleasant affect, NAD Skin:Warm and dry, brisk capillary refill HEENT:normocephalic, sclera clear, mucus membranes moist Neck:supple, no JVD, no bruits  Heart:S1S2 irreg irreg without murmur, gallup, rub or click Lungs:clear without rales, rhonchi, or wheezes HDQ:QIWL, non tender, + BS, do not palpate liver spleen or masses Ext:no lower ext edema, 2+ pedal pulses, 2+ radial pulses, jerking movements of extremities. Neuro:alert and oriented X 3, MAE, follows commands, + facial symmetry    Assessment/Plan Principal Problem:   New onset a-fib- with RVR now on dilt drip and rec'd IV fluids.  CHAD2S2Vasc score 2 Eliquis has been added. Check echo will control rate - pt admitted.  Possible DCCV if remains in a fib.  Active Problems:   Abnormal troponin, may be demand ischemia, follow them   Hyperlipidemia   Prediabetes   COPD (chronic obstructive pulmonary disease)   Pituitary insufficiency   Atrial fibrillation   Probable OSA   INGOLD,LAURA R  Nurse Practitioner Certified [place] Pager [phone] or after 5pm or weekends call [phone] 06/23/2014, 5:07 PM  Patient seen and examined with Cecilie Kicks NP-C. We discussed all aspects of the encounter. See my thoughts below.   New onset AF (~36 hours in duration) in the setting of undiagnosed OSA. CHADVASc = 2 (age). We discussed the pathophysiology of AF at length and also discussed risks and treatment options including: 1) admission with ongoing monitoring 2) DC-CV in ER with outpatient f/u or 3) trial of flecainide in ER with possible d/c on Eliquis. They have opted for #3. Will give flecainide 300 x 1 and watch for at least 4 hours. If hecardioverts and is stable can go home with outpatient f/u next week in AF Clinic to get echo and ongoing f/u. If doesn't cardiovert in ER would admit. I have given him an Eliquis starter kit. Will need script for 5 bid.   Will need outpatient sleep study with Dr. Fransico Him.   I d/w Drs. Zavitz and [employer].   Daniel Bensimhon,MD  Addendum:  HR (40s) and BP (70s) remains low after lopressor.  Has received flecainide, apixaban and IVFs. Will support BP with neosynephrine and admit for further management.   Benay Spice 6:33 PM

## 2014-06-23 NOTE — Telephone Encounter (Signed)
Patient in [facility] ED.

## 2014-06-23 NOTE — ED Notes (Signed)
Pt in from home via [facility], pt c/o generalized weakness & diaphoresis onset x 2 days, pt was placed on monitor when EMS arrived with HR 140-160 new onset A fib, pt rcvd x2 Cardiazem boluses 10 mg each, pt has 5 mg/hr Cardiazem infusion going upon arrival to ED, A&O x4, follows commands, speaks in complete sentences, denies CP, SOB, N/V/D, pt HR upon arrival to ED 150 bpm

## 2014-06-23 NOTE — ED Notes (Signed)
Cardiology NP at bedside.

## 2014-06-23 NOTE — H&P (Addendum)
Patient Demographics  George Haggart, is a 81 y.o. male  MRN: [id number]   DOB - 07-02-1932  Admit Date - 06/23/2014  Outpatient Primary MD for the patient is [name], MD   With History of -  Past Medical History  Diagnosis Date  . Hyperlipidemia   . Arthritis   . COPD (chronic obstructive pulmonary disease) 05/09/2011    Former smoker, fine crackles at bases on exam. PFTs 2012 documented mild-moderate dz    . Hypogonadism male 09/01/2012  . DECREASED HEARING 11/05/2009    Qualifier: Diagnosis of  By: Marijean Niemann CMA, Danielle    . OSTEOPENIA 11/15/2007    Per DEXA 2008    . HYPERGLYCEMIA 05/17/2008    Qualifier: Diagnosis of  By: Larose Kells MD, [place]       Past Surgical History  Procedure Laterality Date  . Tonsillectomy  1955    in for   Chief Complaint  Patient presents with  . Weakness     HPI  Trew Sunde  is a 80 y.o. male, with history of COPD, quit smoking 7 years ago, hypopituitarism, dyslipidemia-low HDL, prediabetic with last A1c of 6.2 who was in good state of health until about 1 day ago, he mowed his lawn on Wednesday and felt fine, sometime Thursday morning or afternoon he started feeling weak, went to see his PCP today where he was found to be in A. fib with RVR and sent to the ER.   In the ER he was again confirmed to be in A. fib with RVR, his troponin was borderline, lab work showed mild leukocytosis otherwise unremarkable, chest x-ray stable, cardiology was consulted and I was called to admit the patient.   He denies any fever or chills, no headache, no chest pain, he does not feel any palpitations, no cough phlegm shortness of breath, no abdominal pain, no diarrhea or dysuria, no blood in stool or urine, no focal weakness. No previous heart problems, no previous history of stroke  etc.    Review of Systems    In addition to the HPI above,   No Fever-chills, No Headache, No changes with Vision or hearing, No problems swallowing food or Liquids, No Chest pain, Cough or Shortness of Breath, No Abdominal pain, No Nausea or Vommitting, Bowel movements are regular, No Blood in stool or Urine, No dysuria, No new skin rashes or bruises, No new joints pains-aches,  No new weakness, tingling, numbness in any extremity, does have generalized weakness No recent weight gain or loss, No polyuria, polydypsia or polyphagia, No significant Mental Stressors.  A full 10 point Review of Systems was done, except as stated above, all other Review of Systems were negative.   Social History History  Substance Use Topics  . Smoking status: Former Research scientist (life sciences)  . Smokeless tobacco: Never Used     Comment: quit 2009  . Alcohol Use: 0.0 oz/week    0 Standard drinks  or equivalent per week     Comment: 1-2 a month wine or beer     Family History Family History  Problem Relation Age of Onset  . Prostate cancer Neg Hx   . Colon cancer Neg Hx   . Hypertension Father   . Arthritis Sister   . Breast cancer Mother   . Hypertension Sister   . CAD Neg Hx       Prior to Admission medications   Medication Sig Start Date End Date Taking? Authorizing Provider  aspirin 81 MG tablet Take 81 mg by mouth daily.     Yes Historical Provider, MD  azelastine (ASTELIN) 0.1 % nasal spray USE 2 SPRAYS IN EACH NOSTRIL 2 TIMES A DAY AS DIRECTED 03/06/14  Yes Colon Branch, MD  CALCIUM-VITAMIN D PO Take 1 each by mouth daily.     Yes Historical Provider, MD  fexofenadine (ALLEGRA) 180 MG tablet Take 180 mg by mouth daily as needed for allergies or rhinitis.   Yes Historical Provider, MD  Multiple Vitamins-Minerals (MULTIVITAMIN,TX-MINERALS) tablet Take 1 tablet by mouth daily.     Yes Historical Provider, MD  Multiple Vitamins-Minerals (PRESERVISION AREDS 2) CAPS Take 2 capsules by mouth 2 (two) times  daily.   Yes Historical Provider, MD  pravastatin (PRAVACHOL) 40 MG tablet TAKE 1 TABLET BY MOUTH DAILY 03/21/14  Yes Colon Branch, MD  Testosterone (ANDROGEL PUMP) 20.25 MG/ACT (1.62%) GEL USE 5 PUMPS ON THE SKIN EVERY DAY 04/05/14  Yes Renato Shin, MD  hydrocortisone 2.5 % cream Apply topically 2 (two) times daily. Patient not taking: Reported on 05/26/2014 05/23/13   Colon Branch, MD    Allergies  Allergen Reactions  . Influenza Virus Vacc Split Pf Other (See Comments)    Hospitalized with temperatures above 103 and classic symptoms of flu  . Tetanus Toxoid Hives    Physical Exam  Vitals  Blood pressure 111/79, pulse 113, temperature 98.4 F (36.9 C), temperature source Oral, resp. rate 16, height 5\' 11"  (1.803 m), weight 87.998 kg (194 lb), SpO2 96 %.   1. General elderly white male lying in bed in NAD,    2. Normal affect and insight, Not Suicidal or Homicidal, Awake Alert, Oriented X 3.  3. No F.N deficits, ALL C.Nerves Intact, Strength 5/5 all 4 extremities, Sensation intact all 4 extremities, Plantars down going.  4. Ears and Eyes appear Normal, Conjunctivae clear, PERRLA. Moist Oral Mucosa.  5. Supple Neck, No JVD, No cervical lymphadenopathy appriciated, No Carotid Bruits.  6. Symmetrical Chest wall movement, Good air movement bilaterally, CTAB.  7. iRRR, No Gallops, Rubs or Murmurs, No Parasternal Heave.  8. Positive Bowel Sounds, Abdomen Soft, No tenderness, No organomegaly appriciated,No rebound -guarding or rigidity.  9.  No Cyanosis, Normal Skin Turgor, No Skin Rash or Bruise.  10. Good muscle tone,  joints appear normal , no effusions, Normal ROM.  11. No Palpable Lymph Nodes in Neck or Axillae     Data Review  CBC  Recent Labs Lab 06/23/14 1500  WBC 13.8*  HGB 15.4  HCT 44.4  PLT 192  MCV 91.2  MCH 31.6  MCHC 34.7  RDW 13.5    ------------------------------------------------------------------------------------------------------------------  Chemistries   Recent Labs Lab 06/23/14 1500  NA 132*  K 4.6  CL 101  CO2 26  GLUCOSE 112*  BUN 14  CREATININE 1.13  CALCIUM 8.7  MG 2.1   ------------------------------------------------------------------------------------------------------------------ estimated creatinine clearance is 56.5 mL/min (by C-G formula based on Cr  of 1.13). ------------------------------------------------------------------------------------------------------------------ No results for input(s): TSH, T4TOTAL, T3FREE, THYROIDAB in the last 72 hours.  Invalid input(s): FREET3   Coagulation profile  Recent Labs Lab 06/23/14 1538  INR 1.22   ------------------------------------------------------------------------------------------------------------------- No results for input(s): DDIMER in the last 72 hours. -------------------------------------------------------------------------------------------------------------------  Cardiac Enzymes  Recent Labs Lab 06/23/14 1500  TROPONINI 0.06*   ------------------------------------------------------------------------------------------------------------------ Invalid input(s): POCBNP   ---------------------------------------------------------------------------------------------------------------  Urinalysis No results found for: COLORURINE, APPEARANCEUR, LABSPEC, PHURINE, GLUCOSEU, HGBUR, BILIRUBINUR, KETONESUR, PROTEINUR, UROBILINOGEN, NITRITE, LEUKOCYTESUR  ----------------------------------------------------------------------------------------------------------------  Imaging results:   Dg Chest Port 1 View  06/23/2014   CLINICAL DATA:  WEEK AND CONFUSED SINCE YESTERDAY.  EXAM: PORTABLE CHEST - 1 VIEW  COMPARISON:  11/06/2009  FINDINGS: Midline trachea. Hyperinflation. Normal heart size and mediastinal contours. Patient  rotated minimally right. No pleural effusion or pneumothorax. Lower lobe predominant interstitial thickening is moderate. No lobar consolidation. Mild scarring at the left lung base.  IMPRESSION: COPD/ chronic bronchitis, without acute superimposed process.   Electronically Signed   By: Abigail Miyamoto M.D.   On: 06/23/2014 15:08    My personal review of EKG: Rhythm Afib, Rate  145 /min,   no Acute ST changes    Assessment & Plan  1. New onset A. fib with RVR-ChadS2 Vasc score of 2 - will be admitted to a telemetry bed, will check echogram along with TSH, since history of hypopituitarism Will check a random cortisol level, he has been rate controlled on Cardizem drip, will place him on Cardizem orally as well, once the rate is persistently under 100 and his symptoms can be stopped. We will place him on Eliquis. Cardiology has been consulted with see the patient shortly.   2. Hypopituitarism. Check cortisol and TSH.   3. COPD. Stable no wheezing, supportive care with nebs and oxygen as needed.   4. Mild reactionary leukocytosis. Chest x-ray stable, no history suggestive of infections, check UA. Monitor.   5. Mild Troponin rise - rate related Trop leak from #1, likely non ACS, cycle Trop, Asa x 1, Echo, Tele. Is pain free, EKG non ischemic.    DVT Prophylaxis Eliquis  AM Labs Ordered, also please review Full Orders  Family Communication: Admission, patients condition and plan of care including tests being ordered have been discussed with the patient and wife who indicate understanding and agree with the plan and Code Status.  Code Status Full  Likely DC to  Home  Condition Fair  Time spent in minutes : 35    Yakira Duquette K M.D on 06/23/2014 at 4:51 PM  Between 7am to 7pm - Pager - [phone]  After 7pm go to [email or web address] - password [facility]  [facility]  Office  [phone]

## 2014-06-23 NOTE — Consult Note (Signed)
CARDIOLOGY CONSULT NOTE   Reason for Consult: atrial fib   Referring Physician: ER MD   PCP:  Willow Ora, MD  Primary Cardiologist: New  Joshua Bullock is an 81 y.o. male.    Chief Complaint: generalized weakness and diaphoresis for 2 days.  Found to be in A fib with RVR, HR 140-160.      HPI: 81 year old male with hx COPD, HLD, hypogonadism presents today with weakness and diaphoresis for 2 days.    Denies any h/o AF or known heart disease.  Was feeling fine on Wednesday active, able to mow the lawn. woke up yesterday at 5am with palpitations and feeling weak. Persisted all day. Today continued to feel weak. Went to PCP and found to be in AF with RVR. Sent to ER. In ER, ECG with AF with v-rate of 145. No ST-T changes. Troponin 0.08. Denied CP or HF symptoms. Started on diltiazem gtt and dropped rate down to about 100. Then given 50 mg of po lopressor and HR dropped into 40s transiently and SBP into 70s. Diltiazem gtt stopped.   Wife says he snores very heavily and frequently stops breathing at night. + excessive daytime sleepiness.   Past Medical History  Diagnosis Date  . Hyperlipidemia   . Arthritis   . COPD (chronic obstructive pulmonary disease) 05/09/2011    Former smoker, fine crackles at bases on exam. PFTs 2012 documented mild-moderate dz    . Hypogonadism male 09/01/2012  . DECREASED HEARING 11/05/2009    Qualifier: Diagnosis of  By: Julianne Handler CMA, Danielle    . OSTEOPENIA 11/15/2007    Per DEXA 2008    . HYPERGLYCEMIA 05/17/2008    Qualifier: Diagnosis of  By: Drue Novel MD, Nolon Rod.     Past Surgical History  Procedure Laterality Date  . Tonsillectomy  1955    Family History  Problem Relation Age of Onset  . Prostate cancer Neg Hx   . Colon cancer Neg Hx   . Hypertension Father   . Arthritis Sister   . Breast cancer Mother   . Hypertension Sister   . CAD Neg Hx    Social History:  reports that he has quit smoking. He has never used smokeless tobacco. He  reports that he drinks alcohol. He reports that he does not use illicit drugs.  Allergies:  Allergies  Allergen Reactions  . Influenza Virus Vacc Split Pf Other (See Comments)    Hospitalized with temperatures above 103 and classic symptoms of flu  . Tetanus Toxoid Hives    Outpatient Meds: No current facility-administered medications on file prior to encounter.   Current Outpatient Prescriptions on File Prior to Encounter  Medication Sig Dispense Refill  . aspirin 81 MG tablet Take 81 mg by mouth daily.      Marland Kitchen azelastine (ASTELIN) 0.1 % nasal spray USE 2 SPRAYS IN EACH NOSTRIL 2 TIMES A DAY AS DIRECTED 30 mL 3  . CALCIUM-VITAMIN D PO Take 1 each by mouth daily.      . Multiple Vitamins-Minerals (MULTIVITAMIN,TX-MINERALS) tablet Take 1 tablet by mouth daily.      . Multiple Vitamins-Minerals (PRESERVISION AREDS 2) CAPS Take 2 capsules by mouth 2 (two) times daily.    . pravastatin (PRAVACHOL) 40 MG tablet TAKE 1 TABLET BY MOUTH DAILY 90 tablet 1  . Testosterone (ANDROGEL PUMP) 20.25 MG/ACT (1.62%) GEL USE 5 PUMPS ON THE SKIN EVERY DAY 225 g 5  . hydrocortisone 2.5 % cream Apply topically 2 (  two) times daily. (Patient not taking: Reported on 05/26/2014) 30 g 0     Results for orders placed or performed during the hospital encounter of 06/23/14 (from the past 48 hour(s))  Basic metabolic panel     Status: Abnormal   Collection Time: 06/23/14  3:00 PM  Result Value Ref Range   Sodium 132 (L) 135 - 145 mmol/L   Potassium 4.6 3.5 - 5.1 mmol/L   Chloride 101 96 - 112 mmol/L   CO2 26 19 - 32 mmol/L   Glucose, Bld 112 (H) 70 - 99 mg/dL   BUN 14 6 - 23 mg/dL   Creatinine, Ser 1.13 0.50 - 1.35 mg/dL   Calcium 8.7 8.4 - 10.5 mg/dL   GFR calc non Af Amer 60 (L) >90 mL/min   GFR calc Af Amer 69 (L) >90 mL/min    Comment: (NOTE) The eGFR has been calculated using the CKD EPI equation. This calculation has not been validated in all clinical situations. eGFR's persistently <90 mL/min signify  possible Chronic Kidney Disease.    Anion gap 5 5 - 15  CBC     Status: Abnormal   Collection Time: 06/23/14  3:00 PM  Result Value Ref Range   WBC 13.8 (H) 4.0 - 10.5 K/uL   RBC 4.87 4.22 - 5.81 MIL/uL   Hemoglobin 15.4 13.0 - 17.0 g/dL   HCT 44.4 39.0 - 52.0 %   MCV 91.2 78.0 - 100.0 fL   MCH 31.6 26.0 - 34.0 pg   MCHC 34.7 30.0 - 36.0 g/dL   RDW 13.5 11.5 - 15.5 %   Platelets 192 150 - 400 K/uL  Troponin I     Status: Abnormal   Collection Time: 06/23/14  3:00 PM  Result Value Ref Range   Troponin I 0.06 (H) <0.031 ng/mL    Comment:        PERSISTENTLY INCREASED TROPONIN VALUES IN THE RANGE OF 0.04-0.49 ng/mL CAN BE SEEN IN:       -UNSTABLE ANGINA       -CONGESTIVE HEART FAILURE       -MYOCARDITIS       -CHEST TRAUMA       -ARRYHTHMIAS       -LATE PRESENTING MYOCARDIAL INFARCTION       -COPD   CLINICAL FOLLOW-UP RECOMMENDED.   Magnesium     Status: None   Collection Time: 06/23/14  3:00 PM  Result Value Ref Range   Magnesium 2.1 1.5 - 2.5 mg/dL  I-stat troponin, ED (do not order at [facility])     Status: None   Collection Time: 06/23/14  3:14 PM  Result Value Ref Range   Troponin i, poc 0.04 0.00 - 0.08 ng/mL   Comment 3            Comment: Due to the release kinetics of cTnI, a negative result within the first hours of the onset of symptoms does not rule out myocardial infarction with certainty. If myocardial infarction is still suspected, repeat the test at appropriate intervals.   Protime-INR     Status: Abnormal   Collection Time: 06/23/14  3:38 PM  Result Value Ref Range   Prothrombin Time 15.6 (H) 11.6 - 15.2 seconds   INR 1.22 0.00 - 1.49   Dg Chest Port 1 View  06/23/2014   CLINICAL DATA:  WEEK AND CONFUSED SINCE YESTERDAY.  EXAM: PORTABLE CHEST - 1 VIEW  COMPARISON:  11/06/2009  FINDINGS: Midline trachea. Hyperinflation. Normal heart size and  mediastinal contours. Patient rotated minimally right. No pleural effusion or pneumothorax. Lower lobe predominant  interstitial thickening is moderate. No lobar consolidation. Mild scarring at the left lung base.  IMPRESSION: COPD/ chronic bronchitis, without acute superimposed process.   Electronically Signed   By: Abigail Miyamoto M.D.   On: 06/23/2014 15:08    ROS: General:no colds or fevers, no weight changes Skin:no rashes or ulcers HEENT:no blurred vision, no congestion CV:see HPI PUL:see HPI GI:no diarrhea constipation or melena, no indigestion GU:no hematuria, no dysuria MS:no joint pain, no claudication Neuro:no syncope, + lightheadedness Endo:no diabetes, no thyroid disease   Blood pressure 120/97, pulse 113, temperature 98.4 F (36.9 C), temperature source Oral, resp. rate 16, height $RemoveBe'5\' 11"'hmzFBzeuY$  (1.803 m), weight 194 lb (87.998 kg), SpO2 98 %.  Wt Readings from Last 3 Encounters:  06/23/14 194 lb (87.998 kg)  05/26/14 191 lb 8 oz (86.864 kg)  01/04/14 198 lb (89.812 kg)    PE: General:Pleasant affect, NAD Skin:Warm and dry, brisk capillary refill HEENT:normocephalic, sclera clear, mucus membranes moist Neck:supple, no JVD, no bruits  Heart:S1S2 irreg irreg without murmur, gallup, rub or click Lungs:clear without rales, rhonchi, or wheezes DGL:OVFI, non tender, + BS, do not palpate liver spleen or masses Ext:no lower ext edema, 2+ pedal pulses, 2+ radial pulses, jerking movements of extremities. Neuro:alert and oriented X 3, MAE, follows commands, + facial symmetry    Assessment/Plan Principal Problem:   New onset a-fib- with RVR now on dilt drip and rec'd IV fluids.  CHAD2S2Vasc score 2 Eliquis has been added. Check echo will control rate - pt admitted.  Possible DCCV if remains in a fib.  Active Problems:   Abnormal troponin, may be demand ischemia, follow them   Hyperlipidemia   Prediabetes   COPD (chronic obstructive pulmonary disease)   Pituitary insufficiency   Atrial fibrillation   Probable OSA   INGOLD,LAURA R  Nurse Practitioner Certified [place] Pager [phone] or after 5pm or weekends call [phone] 06/23/2014, 5:07 PM  Patient seen and examined with Cecilie Kicks NP-C. We discussed all aspects of the encounter. See my thoughts below.   New onset AF (~36 hours in duration) in the setting of undiagnosed OSA. CHADVASc = 2 (age). We discussed the pathophysiology of AF at length and also discussed risks and treatment options including: 1) admission with ongoing monitoring 2) DC-CV in ER with outpatient f/u or 3) trial of flecainide in ER with possible d/c on Eliquis. They have opted for #3. Will give flecainide 300 x 1 and watch for at least 4 hours. If hecardioverts and is stable can go home with outpatient f/u next week in AF Clinic to get echo and ongoing f/u. If doesn't cardiovert in ER would admit. I have given him an Eliquis starter kit. Will need script for 5 bid.   Will need outpatient sleep study with Dr. Fransico Him.   I d/w Drs. Zavitz and [employer].   Benay Spice 5:57 PM

## 2014-06-23 NOTE — ED Notes (Signed)
Pt rcvd 750 mL NS pta

## 2014-06-23 NOTE — ED Notes (Signed)
Admitting MD at bedside.

## 2014-06-23 NOTE — Telephone Encounter (Signed)
[facility] Day - Client TELEPHONE ADVICE RECORD [facility] Patient Name: THADD APUZZO DOB: 08-21-1932 Initial Comment Caller states husband's dizziness began early yesterday; better as the day progressed; slept most of the afternoon; very wobbly today after breakfast; clammy to touch; no vomiting, numbness, dizziness; Nurse Assessment Nurse: Markus Daft, RN, Windy Date/Time (Eastern Time): 06/23/2014 1:12:27 PM Confirm and document reason for call. If symptomatic, describe symptoms. ---Caller states husband's dizziness began early yesterday, and it was better as the day progressed. He slept most of the afternoon. Today he was better and denies dizziness, had breakfast, and didn't feel well by the time he finished eating. Went to take nap in recliner. Then very wobbly, unsteady, just now when to walking to kitchen and clammy to touch, pale and cold. Denies N/V, fever, numbness, or pain, no dizziness. Had BM this AM, no straining. Has the patient traveled out of the country within the last 30 days? ---Not Applicable Does the patient require triage? ---Yes Related visit to physician within the last 2 weeks? ---No Does the PT have any chronic conditions? (i.e. diabetes, asthma, etc.) ---Yes List chronic conditions. ---Testosterone rub used daily for low testosterone. Low NA on last lab report = 136. Guidelines Guideline Title Affirmed Question Affirmed Notes Dizziness - Lightheadedness Shock suspected (e.g., cold/pale/clammy skin, too weak to stand, low BP, rapid pulse) Final Disposition User Call EMS 911 Now [place], Therapist, sports, [employer] reached 911.  They are on their way.

## 2014-06-23 NOTE — Progress Notes (Signed)
Pt admitted with A fib and RVR by cardiology service.  No additional Elink interventions at this time.

## 2014-06-23 NOTE — ED Notes (Addendum)
Bed control aware of bed request change to 2H; icu bed request to be placed by Mickel Baas, NP

## 2014-06-23 NOTE — ED Notes (Signed)
Updated Dr.Bensimhon on pts conditions; awaiting bed request orders to ICU. Pt remains hypotensive, asymptomatic at this time. Neo increased to 93mcg/min per cards

## 2014-06-23 NOTE — ED Notes (Signed)
Pt manual bp 74/50; Cards aware; 563ml bolus in process. Pt is asymptomatic with hypotension.

## 2014-06-24 DIAGNOSIS — I9589 Other hypotension: Secondary | ICD-10-CM

## 2014-06-24 DIAGNOSIS — I4891 Unspecified atrial fibrillation: Secondary | ICD-10-CM

## 2014-06-24 DIAGNOSIS — R7989 Other specified abnormal findings of blood chemistry: Secondary | ICD-10-CM

## 2014-06-24 DIAGNOSIS — J438 Other emphysema: Secondary | ICD-10-CM

## 2014-06-24 LAB — CORTISOL: CORTISOL PLASMA: 9.9 ug/dL

## 2014-06-24 LAB — BASIC METABOLIC PANEL
ANION GAP: 2 — AB (ref 5–15)
BUN: 18 mg/dL (ref 6–23)
CO2: 27 mmol/L (ref 19–32)
Calcium: 8.1 mg/dL — ABNORMAL LOW (ref 8.4–10.5)
Chloride: 103 mmol/L (ref 96–112)
Creatinine, Ser: 1.27 mg/dL (ref 0.50–1.35)
GFR, EST AFRICAN AMERICAN: 60 mL/min — AB (ref >90)
GFR, EST NON AFRICAN AMERICAN: 52 mL/min — AB (ref >90)
Glucose, Bld: 106 mg/dL — ABNORMAL HIGH (ref 70–99)
Potassium: 5 mmol/L (ref 3.5–5.1)
Sodium: 132 mmol/L — ABNORMAL LOW (ref 135–145)

## 2014-06-24 LAB — CBC
HEMATOCRIT: 39.9 % (ref 39.0–52.0)
HEMOGLOBIN: 13.6 g/dL (ref 13.0–17.0)
MCH: 31.6 pg (ref 26.0–34.0)
MCHC: 34.1 g/dL (ref 30.0–36.0)
MCV: 92.8 fL (ref 78.0–100.0)
Platelets: 204 10*3/uL (ref 150–400)
RBC: 4.3 MIL/uL (ref 4.22–5.81)
RDW: 13.8 % (ref 11.5–15.5)
WBC: 13.2 10*3/uL — ABNORMAL HIGH (ref 4.0–10.5)

## 2014-06-24 LAB — TROPONIN I: TROPONIN I: 0.12 ng/mL — AB (ref <0.031)

## 2014-06-24 MED ORDER — FUROSEMIDE 10 MG/ML IJ SOLN
20.0000 mg | Freq: Once | INTRAMUSCULAR | Status: AC
  Administered 2014-06-24: 20 mg via INTRAVENOUS
  Filled 2014-06-24: qty 2

## 2014-06-24 NOTE — Progress Notes (Signed)
DAILY PROGRESS NOTE  Subjective:  No events overnight. Weaned off of neosynephrine.  Maintaining sinus bradycardia with PAC's. Blood pressure has recovered. Plan for 2d echocardiogram today.  Objective:  Temp:  [97.8 F (36.6 C)-98.4 F (36.9 C)] 97.8 F (36.6 C) (04/02 0811) Pulse Rate:  [31-152] 53 (04/02 0811) Resp:  [14-28] 22 (04/02 0811) BP: (57-157)/(5-97) 137/75 mmHg (04/02 0800) SpO2:  [90 %-100 %] 94 % (04/02 0811) FiO2 (%):  [0 %] 0 % (04/01 2038) Weight:  [193 lb 9 oz (87.8 kg)-194 lb (87.998 kg)] 193 lb 9 oz (87.8 kg) (04/02 0345) Weight change:   Intake/Output from previous day: 04/01 0701 - 04/02 0700 In: 2306.8 [I.V.:2306.8] Out: 1000 [Urine:1000]  Intake/Output from this shift:    Medications: Current Facility-Administered Medications  Medication Dose Route Frequency Provider Last Rate Last Dose  . alum & mag hydroxide-simeth (MAALOX/MYLANTA) 200-200-20 MG/5ML suspension 30 mL  30 mL Oral Q6H PRN Thurnell Lose, MD      . apixaban Arne Cleveland) tablet 5 mg  5 mg Oral BID Isaiah Serge, NP   5 mg at 06/23/14 1815  . azelastine (ASTELIN) 0.1 % nasal spray 2 spray  2 spray Each Nare BID Thurnell Lose, MD   2 spray at 06/23/14 2200  . diltiazem (CARDIZEM) tablet 60 mg  60 mg Oral 4 times per day Thurnell Lose, MD   60 mg at 06/24/14 0618  . guaiFENesin-dextromethorphan (ROBITUSSIN DM) 100-10 MG/5ML syrup 5 mL  5 mL Oral Q4H PRN Thurnell Lose, MD      . HYDROcodone-acetaminophen (NORCO/VICODIN) 5-325 MG per tablet 1 tablet  1 tablet Oral Q6H PRN Thurnell Lose, MD      . multivitamin with minerals tablet 1 tablet  1 tablet Oral Daily Jolaine Artist, MD      . ondansetron (ZOFRAN) tablet 4 mg  4 mg Oral Q6H PRN Thurnell Lose, MD       Or  . ondansetron (ZOFRAN) injection 4 mg  4 mg Intravenous Q6H PRN Thurnell Lose, MD      . phenylephrine (NEO-SYNEPHRINE) 10 mg in dextrose 5 % 250 mL (0.04 mg/mL) infusion  0-400 mcg/min Intravenous  Titrated Jolaine Artist, MD   Stopped at 06/24/14 0200  . polyethylene glycol (MIRALAX / GLYCOLAX) packet 17 g  17 g Oral Daily PRN Thurnell Lose, MD      . pravastatin (PRAVACHOL) tablet 40 mg  40 mg Oral q1800 Thurnell Lose, MD   40 mg at 06/23/14 2115  . sodium chloride 0.9 % injection 3 mL  3 mL Intravenous Q12H Thurnell Lose, MD   3 mL at 06/23/14 2116    Physical Exam: General appearance: alert and no distress Neck: no carotid bruit and no JVD Lungs: rales bilaterally and apex to base Heart: regular rate and rhythm and occasional ectopy Abdomen: soft, non-tender; bowel sounds normal; no masses,  no organomegaly Extremities: extremities normal, atraumatic, no cyanosis or edema Pulses: 2+ and symmetric Skin: Skin color, texture, turgor normal. No rashes or lesions Neurologic: Grossly normal Psych: Normal  Lab Results: Results for orders placed or performed during the hospital encounter of 06/23/14 (from the past 48 hour(s))  Basic metabolic panel     Status: Abnormal   Collection Time: 06/23/14  3:00 PM  Result Value Ref Range   Sodium 132 (L) 135 - 145 mmol/L   Potassium 4.6 3.5 - 5.1 mmol/L   Chloride 101 96 -  112 mmol/L   CO2 26 19 - 32 mmol/L   Glucose, Bld 112 (H) 70 - 99 mg/dL   BUN 14 6 - 23 mg/dL   Creatinine, Ser 1.13 0.50 - 1.35 mg/dL   Calcium 8.7 8.4 - 10.5 mg/dL   GFR calc non Af Amer 60 (L) >90 mL/min   GFR calc Af Amer 69 (L) >90 mL/min    Comment: (NOTE) The eGFR has been calculated using the CKD EPI equation. This calculation has not been validated in all clinical situations. eGFR's persistently <90 mL/min signify possible Chronic Kidney Disease.    Anion gap 5 5 - 15  CBC     Status: Abnormal   Collection Time: 06/23/14  3:00 PM  Result Value Ref Range   WBC 13.8 (H) 4.0 - 10.5 K/uL   RBC 4.87 4.22 - 5.81 MIL/uL   Hemoglobin 15.4 13.0 - 17.0 g/dL   HCT 44.4 39.0 - 52.0 %   MCV 91.2 78.0 - 100.0 fL   MCH 31.6 26.0 - 34.0 pg   MCHC  34.7 30.0 - 36.0 g/dL   RDW 13.5 11.5 - 15.5 %   Platelets 192 150 - 400 K/uL  Troponin I     Status: Abnormal   Collection Time: 06/23/14  3:00 PM  Result Value Ref Range   Troponin I 0.06 (H) <0.031 ng/mL    Comment:        PERSISTENTLY INCREASED TROPONIN VALUES IN THE RANGE OF 0.04-0.49 ng/mL CAN BE SEEN IN:       -UNSTABLE ANGINA       -CONGESTIVE HEART FAILURE       -MYOCARDITIS       -CHEST TRAUMA       -ARRYHTHMIAS       -LATE PRESENTING MYOCARDIAL INFARCTION       -COPD   CLINICAL FOLLOW-UP RECOMMENDED.   Magnesium     Status: None   Collection Time: 06/23/14  3:00 PM  Result Value Ref Range   Magnesium 2.1 1.5 - 2.5 mg/dL  I-stat troponin, ED (do not order at [facility])     Status: None   Collection Time: 06/23/14  3:14 PM  Result Value Ref Range   Troponin i, poc 0.04 0.00 - 0.08 ng/mL   Comment 3            Comment: Due to the release kinetics of cTnI, a negative result within the first hours of the onset of symptoms does not rule out myocardial infarction with certainty. If myocardial infarction is still suspected, repeat the test at appropriate intervals.   Protime-INR     Status: Abnormal   Collection Time: 06/23/14  3:38 PM  Result Value Ref Range   Prothrombin Time 15.6 (H) 11.6 - 15.2 seconds   INR 1.22 0.00 - 1.49  Cortisol     Status: None   Collection Time: 06/23/14  4:37 PM  Result Value Ref Range   Cortisol, Plasma 9.9 ug/dL    Comment: (NOTE) AM:  4.3 - 22.4 ug/dL PM:  3.1 - 16.7 ug/dL Performed at [facility]   TSH     Status: None   Collection Time: 06/23/14  4:37 PM  Result Value Ref Range   TSH 1.862 0.350 - 4.500 uIU/mL  Troponin I (q 6hr x 3)     Status: Abnormal   Collection Time: 06/23/14  4:37 PM  Result Value Ref Range   Troponin I 0.08 (H) <0.031 ng/mL    Comment:  PERSISTENTLY INCREASED TROPONIN VALUES IN THE RANGE OF 0.04-0.49 ng/mL CAN BE SEEN IN:       -UNSTABLE ANGINA       -CONGESTIVE HEART FAILURE        -MYOCARDITIS       -CHEST TRAUMA       -ARRYHTHMIAS       -LATE PRESENTING MYOCARDIAL INFARCTION       -COPD   CLINICAL FOLLOW-UP RECOMMENDED.   Urinalysis, Routine w reflex microscopic     Status: Abnormal   Collection Time: 06/23/14  4:50 PM  Result Value Ref Range   Color, Urine YELLOW YELLOW   APPearance CLEAR CLEAR   Specific Gravity, Urine 1.018 1.005 - 1.030   pH 6.5 5.0 - 8.0   Glucose, UA NEGATIVE NEGATIVE mg/dL   Hgb urine dipstick NEGATIVE NEGATIVE   Bilirubin Urine NEGATIVE NEGATIVE   Ketones, ur 15 (A) NEGATIVE mg/dL   Protein, ur 30 (A) NEGATIVE mg/dL   Urobilinogen, UA 0.2 0.0 - 1.0 mg/dL   Nitrite NEGATIVE NEGATIVE   Leukocytes, UA NEGATIVE NEGATIVE  Urine microscopic-add on     Status: Abnormal   Collection Time: 06/23/14  4:50 PM  Result Value Ref Range   Squamous Epithelial / LPF RARE RARE   WBC, UA 0-2 <3 WBC/hpf   Bacteria, UA RARE RARE   Casts HYALINE CASTS (A) NEGATIVE   Urine-Other MUCOUS PRESENT   MRSA PCR Screening     Status: None   Collection Time: 06/23/14  8:38 PM  Result Value Ref Range   MRSA by PCR NEGATIVE NEGATIVE    Comment:        The GeneXpert MRSA Assay (FDA approved for NASAL specimens only), is one component of a comprehensive MRSA colonization surveillance program. It is not intended to diagnose MRSA infection nor to guide or monitor treatment for MRSA infections.   Troponin I (q 6hr x 3)     Status: Abnormal   Collection Time: 06/23/14  9:50 PM  Result Value Ref Range   Troponin I 0.07 (H) <0.031 ng/mL    Comment:        PERSISTENTLY INCREASED TROPONIN VALUES IN THE RANGE OF 0.04-0.49 ng/mL CAN BE SEEN IN:       -UNSTABLE ANGINA       -CONGESTIVE HEART FAILURE       -MYOCARDITIS       -CHEST TRAUMA       -ARRYHTHMIAS       -LATE PRESENTING MYOCARDIAL INFARCTION       -COPD   CLINICAL FOLLOW-UP RECOMMENDED.   Troponin I (q 6hr x 3)     Status: Abnormal   Collection Time: 06/24/14  4:00 AM  Result Value Ref Range    Troponin I 0.12 (H) <0.031 ng/mL    Comment:        PERSISTENTLY INCREASED TROPONIN VALUES IN THE RANGE OF 0.04-0.49 ng/mL CAN BE SEEN IN:       -UNSTABLE ANGINA       -CONGESTIVE HEART FAILURE       -MYOCARDITIS       -CHEST TRAUMA       -ARRYHTHMIAS       -LATE PRESENTING MYOCARDIAL INFARCTION       -COPD   CLINICAL FOLLOW-UP RECOMMENDED.   Basic metabolic panel     Status: Abnormal   Collection Time: 06/24/14  4:00 AM  Result Value Ref Range   Sodium 132 (L) 135 - 145 mmol/L   Potassium 5.0  3.5 - 5.1 mmol/L   Chloride 103 96 - 112 mmol/L   CO2 27 19 - 32 mmol/L   Glucose, Bld 106 (H) 70 - 99 mg/dL   BUN 18 6 - 23 mg/dL   Creatinine, Ser 1.27 0.50 - 1.35 mg/dL   Calcium 8.1 (L) 8.4 - 10.5 mg/dL   GFR calc non Af Amer 52 (L) >90 mL/min   GFR calc Af Amer 60 (L) >90 mL/min    Comment: (NOTE) The eGFR has been calculated using the CKD EPI equation. This calculation has not been validated in all clinical situations. eGFR's persistently <90 mL/min signify possible Chronic Kidney Disease.    Anion gap 2 (L) 5 - 15    Comment: REPEATED TO VERIFY  CBC     Status: Abnormal   Collection Time: 06/24/14  4:00 AM  Result Value Ref Range   WBC 13.2 (H) 4.0 - 10.5 K/uL   RBC 4.30 4.22 - 5.81 MIL/uL   Hemoglobin 13.6 13.0 - 17.0 g/dL   HCT 39.9 39.0 - 52.0 %   MCV 92.8 78.0 - 100.0 fL   MCH 31.6 26.0 - 34.0 pg   MCHC 34.1 30.0 - 36.0 g/dL   RDW 13.8 11.5 - 15.5 %   Platelets 204 150 - 400 K/uL    Imaging: Dg Chest Port 1 View  06/23/2014   CLINICAL DATA:  WEEK AND CONFUSED SINCE YESTERDAY.  EXAM: PORTABLE CHEST - 1 VIEW  COMPARISON:  11/06/2009  FINDINGS: Midline trachea. Hyperinflation. Normal heart size and mediastinal contours. Patient rotated minimally right. No pleural effusion or pneumothorax. Lower lobe predominant interstitial thickening is moderate. No lobar consolidation. Mild scarring at the left lung base.  IMPRESSION: COPD/ chronic bronchitis, without acute  superimposed process.   Electronically Signed   By: Abigail Miyamoto M.D.   On: 06/23/2014 15:08    Assessment:  1. Principal Problem: 2.   New onset a-fib 3. Active Problems: 4.   Hyperlipidemia 5.   Prediabetes 6.   COPD (chronic obstructive pulmonary disease) 7.   Pituitary insufficiency 8.   Atrial fibrillation 9.   Hypotension 10.   Elevated troponin  Plan:  1. HR now in the 50's with PAC's. He feels much better. Exam consistent with crackles -may be fibrosis, CXR was not revealing. Given his RVR, will dose 20 mg IV lasix - Sa02 is 95% at rest. Creatinine has risen slightly - urine with hyaline casts, however Spec gravity suggests dilution - I don't feel this is dehydration. Check 2D echo this am. Given bradycardia, hold additional cardizem (was given at 6:30 am). May need b-blocker if found to have LV dysfunction. Notable elevated troponin - but flat curve, ?related to heart failure. Agree with outpatient sleep study. Cannot see a clear reason for respiratory precautions - no cough, fever, chills or viral symptoms - will d/c. Leukocytosis is slowly improving.   Time Spent Directly with Patient:  15 minutes  Length of Stay:  LOS: 1 day   Joshua Casino, MD, [facility] Attending Cardiologist [facility]  Adonias Demore C 06/24/2014, 8:43 AM

## 2014-06-24 NOTE — Progress Notes (Signed)
  Echocardiogram 2D Echocardiogram has been performed.  Lysle Rubens 06/24/2014, 1:59 PM

## 2014-06-25 ENCOUNTER — Other Ambulatory Visit: Payer: Self-pay | Admitting: Cardiology

## 2014-06-25 ENCOUNTER — Other Ambulatory Visit: Payer: Self-pay | Admitting: Physician Assistant

## 2014-06-25 ENCOUNTER — Telehealth: Payer: Self-pay | Admitting: Physician Assistant

## 2014-06-25 ENCOUNTER — Inpatient Hospital Stay (HOSPITAL_COMMUNITY): Payer: Medicare Other

## 2014-06-25 DIAGNOSIS — R778 Other specified abnormalities of plasma proteins: Secondary | ICD-10-CM

## 2014-06-25 DIAGNOSIS — R7989 Other specified abnormal findings of blood chemistry: Secondary | ICD-10-CM

## 2014-06-25 DIAGNOSIS — J8410 Pulmonary fibrosis, unspecified: Secondary | ICD-10-CM

## 2014-06-25 DIAGNOSIS — I5189 Other ill-defined heart diseases: Secondary | ICD-10-CM | POA: Diagnosis present

## 2014-06-25 DIAGNOSIS — N289 Disorder of kidney and ureter, unspecified: Secondary | ICD-10-CM

## 2014-06-25 DIAGNOSIS — R7309 Other abnormal glucose: Secondary | ICD-10-CM

## 2014-06-25 DIAGNOSIS — E871 Hypo-osmolality and hyponatremia: Secondary | ICD-10-CM

## 2014-06-25 LAB — URINE CULTURE

## 2014-06-25 MED ORDER — APIXABAN 5 MG PO TABS: 5.0000 mg | ORAL_TABLET | Freq: Two times a day (BID) | ORAL | Status: DC

## 2014-06-25 MED ORDER — AMLODIPINE BESYLATE 5 MG PO TABS: 5.0000 mg | ORAL_TABLET | Freq: Every day | ORAL | Status: DC

## 2014-06-25 MED ORDER — AMLODIPINE BESYLATE 5 MG PO TABS
5.0000 mg | ORAL_TABLET | Freq: Every day | ORAL | Status: DC
  Administered 2014-06-25: 5 mg via ORAL
  Filled 2014-06-25: qty 1

## 2014-06-25 MED ORDER — AMLODIPINE BESYLATE 5 MG PO TABS: 5.0000 mg | ORAL_TABLET | Freq: Every day | ORAL | Status: AC

## 2014-06-25 NOTE — Progress Notes (Addendum)
DAILY PROGRESS NOTE  Subjective:  HR now in the 60's and regular. Hypertensive this morning. Echo yesterday shows preserved LV function - EF 60-65%, sigmoid septum, diastolic dysfunction and normal biatrial size. He is 1.6 L negative yesterday with diuresis - sodium remains low. Potassium has risen some today.   Objective:  Temp:  [97.5 F (36.4 C)-98.3 F (36.8 C)] 98 F (36.7 C) (04/03 0800) Pulse Rate:  [42-78] 57 (04/03 0800) Resp:  [17-24] 19 (04/03 0800) BP: (112-164)/(44-104) 164/104 mmHg (04/03 0800) SpO2:  [90 %-98 %] 92 % (04/03 0800) Weight:  [184 lb 4.9 oz (83.6 kg)] 184 lb 4.9 oz (83.6 kg) (04/03 0500) Weight change: -9 lb 11.1 oz (-4.398 kg)  Intake/Output from previous day: 04/02 0701 - 04/03 0700 In: 480 [P.O.:480] Out: 3700 [Urine:3700]  Intake/Output from this shift: Total I/O In: 240 [P.O.:240] Out: -   Medications: Current Facility-Administered Medications  Medication Dose Route Frequency Provider Last Rate Last Dose  . alum & mag hydroxide-simeth (MAALOX/MYLANTA) 200-200-20 MG/5ML suspension 30 mL  30 mL Oral Q6H PRN Leroy Sea, MD      . apixaban Everlene Balls) tablet 5 mg  5 mg Oral BID Leone Brand, NP   5 mg at 06/24/14 2132  . azelastine (ASTELIN) 0.1 % nasal spray 2 spray  2 spray Each Nare BID Leroy Sea, MD   2 spray at 06/23/14 2200  . guaiFENesin-dextromethorphan (ROBITUSSIN DM) 100-10 MG/5ML syrup 5 mL  5 mL Oral Q4H PRN Leroy Sea, MD      . HYDROcodone-acetaminophen (NORCO/VICODIN) 5-325 MG per tablet 1 tablet  1 tablet Oral Q6H PRN Leroy Sea, MD      . multivitamin with minerals tablet 1 tablet  1 tablet Oral Daily Dolores Patty, MD   1 tablet at 06/24/14 1012  . ondansetron (ZOFRAN) tablet 4 mg  4 mg Oral Q6H PRN Leroy Sea, MD       Or  . ondansetron (ZOFRAN) injection 4 mg  4 mg Intravenous Q6H PRN Leroy Sea, MD      . phenylephrine (NEO-SYNEPHRINE) 10 mg in dextrose 5 % 250 mL (0.04 mg/mL)  infusion  0-400 mcg/min Intravenous Titrated Dolores Patty, MD   Stopped at 06/24/14 0200  . polyethylene glycol (MIRALAX / GLYCOLAX) packet 17 g  17 g Oral Daily PRN Leroy Sea, MD      . pravastatin (PRAVACHOL) tablet 40 mg  40 mg Oral q1800 Leroy Sea, MD   40 mg at 06/24/14 1758  . sodium chloride 0.9 % injection 3 mL  3 mL Intravenous Q12H Leroy Sea, MD   3 mL at 06/24/14 2200    Physical Exam: General appearance: alert and no distress Neck: no carotid bruit and no JVD Lungs: rales bilaterally and apex to base Heart: regular rate and rhythm and occasional ectopy Abdomen: soft, non-tender; bowel sounds normal; no masses,  no organomegaly Extremities: extremities normal, atraumatic, no cyanosis or edema Pulses: 2+ and symmetric Skin: Skin color, texture, turgor normal. No rashes or lesions Neurologic: Grossly normal Psych: Normal  Lab Results: Results for orders placed or performed during the hospital encounter of 06/23/14 (from the past 48 hour(s))  Basic metabolic panel     Status: Abnormal   Collection Time: 06/23/14  3:00 PM  Result Value Ref Range   Sodium 132 (L) 135 - 145 mmol/L   Potassium 4.6 3.5 - 5.1 mmol/L   Chloride 101 96 -  112 mmol/L   CO2 26 19 - 32 mmol/L   Glucose, Bld 112 (H) 70 - 99 mg/dL   BUN 14 6 - 23 mg/dL   Creatinine, Ser 2.04 0.50 - 1.35 mg/dL   Calcium 8.7 8.4 - 85.6 mg/dL   GFR calc non Af Amer 60 (L) >90 mL/min   GFR calc Af Amer 69 (L) >90 mL/min    Comment: (NOTE) The eGFR has been calculated using the CKD EPI equation. This calculation has not been validated in all clinical situations. eGFR's persistently <90 mL/min signify possible Chronic Kidney Disease.    Anion gap 5 5 - 15  CBC     Status: Abnormal   Collection Time: 06/23/14  3:00 PM  Result Value Ref Range   WBC 13.8 (H) 4.0 - 10.5 K/uL   RBC 4.87 4.22 - 5.81 MIL/uL   Hemoglobin 15.4 13.0 - 17.0 g/dL   HCT 12.3 09.7 - 70.2 %   MCV 91.2 78.0 - 100.0 fL    MCH 31.6 26.0 - 34.0 pg   MCHC 34.7 30.0 - 36.0 g/dL   RDW 31.2 39.7 - 14.1 %   Platelets 192 150 - 400 K/uL  Troponin I     Status: Abnormal   Collection Time: 06/23/14  3:00 PM  Result Value Ref Range   Troponin I 0.06 (H) <0.031 ng/mL    Comment:        PERSISTENTLY INCREASED TROPONIN VALUES IN THE RANGE OF 0.04-0.49 ng/mL CAN BE SEEN IN:       -UNSTABLE ANGINA       -CONGESTIVE HEART FAILURE       -MYOCARDITIS       -CHEST TRAUMA       -ARRYHTHMIAS       -LATE PRESENTING MYOCARDIAL INFARCTION       -COPD   CLINICAL FOLLOW-UP RECOMMENDED.   Magnesium     Status: None   Collection Time: 06/23/14  3:00 PM  Result Value Ref Range   Magnesium 2.1 1.5 - 2.5 mg/dL  I-stat troponin, ED (do not order at [facility])     Status: None   Collection Time: 06/23/14  3:14 PM  Result Value Ref Range   Troponin i, poc 0.04 0.00 - 0.08 ng/mL   Comment 3            Comment: Due to the release kinetics of cTnI, a negative result within the first hours of the onset of symptoms does not rule out myocardial infarction with certainty. If myocardial infarction is still suspected, repeat the test at appropriate intervals.   Protime-INR     Status: Abnormal   Collection Time: 06/23/14  3:38 PM  Result Value Ref Range   Prothrombin Time 15.6 (H) 11.6 - 15.2 seconds   INR 1.22 0.00 - 1.49  Cortisol     Status: None   Collection Time: 06/23/14  4:37 PM  Result Value Ref Range   Cortisol, Plasma 9.9 ug/dL    Comment: (NOTE) AM:  4.3 - 22.4 ug/dL PM:  3.1 - 04.8 ug/dL Performed at [facility]   TSH     Status: None   Collection Time: 06/23/14  4:37 PM  Result Value Ref Range   TSH 1.862 0.350 - 4.500 uIU/mL  Troponin I (q 6hr x 3)     Status: Abnormal   Collection Time: 06/23/14  4:37 PM  Result Value Ref Range   Troponin I 0.08 (H) <0.031 ng/mL    Comment:  PERSISTENTLY INCREASED TROPONIN VALUES IN THE RANGE OF 0.04-0.49 ng/mL CAN BE SEEN IN:       -UNSTABLE ANGINA        -CONGESTIVE HEART FAILURE       -MYOCARDITIS       -CHEST TRAUMA       -ARRYHTHMIAS       -LATE PRESENTING MYOCARDIAL INFARCTION       -COPD   CLINICAL FOLLOW-UP RECOMMENDED.   Urinalysis, Routine w reflex microscopic     Status: Abnormal   Collection Time: 06/23/14  4:50 PM  Result Value Ref Range   Color, Urine YELLOW YELLOW   APPearance CLEAR CLEAR   Specific Gravity, Urine 1.018 1.005 - 1.030   pH 6.5 5.0 - 8.0   Glucose, UA NEGATIVE NEGATIVE mg/dL   Hgb urine dipstick NEGATIVE NEGATIVE   Bilirubin Urine NEGATIVE NEGATIVE   Ketones, ur 15 (A) NEGATIVE mg/dL   Protein, ur 30 (A) NEGATIVE mg/dL   Urobilinogen, UA 0.2 0.0 - 1.0 mg/dL   Nitrite NEGATIVE NEGATIVE   Leukocytes, UA NEGATIVE NEGATIVE  Urine microscopic-add on     Status: Abnormal   Collection Time: 06/23/14  4:50 PM  Result Value Ref Range   Squamous Epithelial / LPF RARE RARE   WBC, UA 0-2 <3 WBC/hpf   Bacteria, UA RARE RARE   Casts HYALINE CASTS (A) NEGATIVE   Urine-Other MUCOUS PRESENT   MRSA PCR Screening     Status: None   Collection Time: 06/23/14  8:38 PM  Result Value Ref Range   MRSA by PCR NEGATIVE NEGATIVE    Comment:        The GeneXpert MRSA Assay (FDA approved for NASAL specimens only), is one component of a comprehensive MRSA colonization surveillance program. It is not intended to diagnose MRSA infection nor to guide or monitor treatment for MRSA infections.   Troponin I (q 6hr x 3)     Status: Abnormal   Collection Time: 06/23/14  9:50 PM  Result Value Ref Range   Troponin I 0.07 (H) <0.031 ng/mL    Comment:        PERSISTENTLY INCREASED TROPONIN VALUES IN THE RANGE OF 0.04-0.49 ng/mL CAN BE SEEN IN:       -UNSTABLE ANGINA       -CONGESTIVE HEART FAILURE       -MYOCARDITIS       -CHEST TRAUMA       -ARRYHTHMIAS       -LATE PRESENTING MYOCARDIAL INFARCTION       -COPD   CLINICAL FOLLOW-UP RECOMMENDED.   Troponin I (q 6hr x 3)     Status: Abnormal   Collection Time: 06/24/14   4:00 AM  Result Value Ref Range   Troponin I 0.12 (H) <0.031 ng/mL    Comment:        PERSISTENTLY INCREASED TROPONIN VALUES IN THE RANGE OF 0.04-0.49 ng/mL CAN BE SEEN IN:       -UNSTABLE ANGINA       -CONGESTIVE HEART FAILURE       -MYOCARDITIS       -CHEST TRAUMA       -ARRYHTHMIAS       -LATE PRESENTING MYOCARDIAL INFARCTION       -COPD   CLINICAL FOLLOW-UP RECOMMENDED.   Basic metabolic panel     Status: Abnormal   Collection Time: 06/24/14  4:00 AM  Result Value Ref Range   Sodium 132 (L) 135 - 145 mmol/L   Potassium 5.0  3.5 - 5.1 mmol/L   Chloride 103 96 - 112 mmol/L   CO2 27 19 - 32 mmol/L   Glucose, Bld 106 (H) 70 - 99 mg/dL   BUN 18 6 - 23 mg/dL   Creatinine, Ser 1.27 0.50 - 1.35 mg/dL   Calcium 8.1 (L) 8.4 - 10.5 mg/dL   GFR calc non Af Amer 52 (L) >90 mL/min   GFR calc Af Amer 60 (L) >90 mL/min    Comment: (NOTE) The eGFR has been calculated using the CKD EPI equation. This calculation has not been validated in all clinical situations. eGFR's persistently <90 mL/min signify possible Chronic Kidney Disease.    Anion gap 2 (L) 5 - 15    Comment: REPEATED TO VERIFY  CBC     Status: Abnormal   Collection Time: 06/24/14  4:00 AM  Result Value Ref Range   WBC 13.2 (H) 4.0 - 10.5 K/uL   RBC 4.30 4.22 - 5.81 MIL/uL   Hemoglobin 13.6 13.0 - 17.0 g/dL   HCT 39.9 39.0 - 52.0 %   MCV 92.8 78.0 - 100.0 fL   MCH 31.6 26.0 - 34.0 pg   MCHC 34.1 30.0 - 36.0 g/dL   RDW 13.8 11.5 - 15.5 %   Platelets 204 150 - 400 K/uL    Imaging: Dg Chest Port 1 View  06/23/2014   CLINICAL DATA:  WEEK AND CONFUSED SINCE YESTERDAY.  EXAM: PORTABLE CHEST - 1 VIEW  COMPARISON:  11/06/2009  FINDINGS: Midline trachea. Hyperinflation. Normal heart size and mediastinal contours. Patient rotated minimally right. No pleural effusion or pneumothorax. Lower lobe predominant interstitial thickening is moderate. No lobar consolidation. Mild scarring at the left lung base.  IMPRESSION: COPD/ chronic  bronchitis, without acute superimposed process.   Electronically Signed   By: Abigail Miyamoto M.D.   On: 06/23/2014 15:08    Assessment:  Principal Problem:   New onset a-fib Active Problems:   Hyperlipidemia   Prediabetes   COPD (chronic obstructive pulmonary disease)   Pituitary insufficiency   Atrial fibrillation   Hypotension 1.   Elevated troponin  Plan:  HR stable in the 60's. EF is preserved at 60% - no pericardial effusion. Breathing has improved since yesterday. Troponin is flat, without typical rise and fall suggestive of ACS. Ok to d/c home today. He has had hyponatremia and potassium is slightly high. Would recommend a repeat BMET on Wednesday next week. Also, would schedule a lexiscan myoview in the [place] office for elevated troponin/a-fib. Will need follow-up with me in 2-3 weeks. D/c on eliquis for anticoagulation (CHADSVASC 2). Would also recommend an outpatient sleep study, which I can arrange at follow-up as well as pulmonary referral - he has COPD and exam findings of pulmonary fibrosis.  Add amlodipine 5 mg daily for elevated BP - avoid b-blocker due to bradycardia.  Time Spent Directly with Patient:  15 minutes  Length of Stay:   LOS: 2 days   Pixie Casino, MD, [facility] Attending Cardiologist [facility]  Osmara Drummonds C 06/25/2014, 8:57 AM

## 2014-06-25 NOTE — Discharge Summary (Signed)
Patient ID: Joshua Bullock,  MRN: [id number], DOB/AGE: 79/24/1934 79 y.o.  Admit date: 06/23/2014 Discharge date: 06/25/2014  Primary Care Provider: [name], MD Primary Cardiologist: Dr Debara Pickett (new)  Discharge Diagnoses Principal Problem:   Atrial fibrillation with RVR Active Problems:   New onset a-fib   Troponin level elevated   COPD (chronic obstructive pulmonary disease)   Renal insufficiency   Diastolic dysfunction-Grade 2 on echo 06/24/14   Hyperlipidemia   Prediabetes   Pituitary insufficiency    Hospital Course:   81 year old male with hx COPD and pulmonary fibrosis, HLD, and hypogonadism admitted 06/23/14 with weakness and diaphoresis for 2 days. Denies any h/o AF or known heart disease. Was feeling fine on Wednesday prior to admission, active, able to mow the lawn. woke up Thursday at 5am with palpitations and feeling weak. This persisted all day. On 06/23/14 he continued to feel weak. Went to PCP and found to be in AF with RVR. Sent to ER. In ER, ECG with AF with v-rate of 145. No ST-T changes. Troponin 0.08. Denied CP or HF symptoms. Started on diltiazem gtt and dropped rate down to about 100. Then given 50 mg of po lopressor and HR dropped into 40s transiently and SBP into 70s. Diltiazem gtt stopped. Pt admitted to step down. CHA2DS2VASc =2 and he was started on Eliquis. He was given one dose of Flecainide in the ED. He was hypotensive and required Neosynephrine for a few hours. He converted to sinus bradycardia. Echo done 06/24/14 revealed an EF of 60-65% with grade 2 diastolic dysfunction. Dr Debara Pickett feel she can be discharge 06/25/14. He would like him to get an OP Lexiscan and a BMP and this will be arranged. He is also to be seen in the AF clinic.  Marland Kitchen   Discharge Vitals:  Blood pressure 164/104, pulse 57, temperature 98 F (36.7 C), temperature source Oral, resp. rate 19, height 5\' 11"  (1.803 m), weight 184 lb 4.9 oz (83.6 kg), SpO2 92 %.    Labs: No results found for  this or any previous visit (from the past 24 hour(s)).  Disposition:  Follow-up Information    Follow up with CARROLL,DONNA, NP.   Specialty:  Nurse Practitioner   Why:  the office will callyou with date and time   Contact information:   [place] [address] [phone]       Follow up with [facility].   Specialty:  Cardiology   Why:  the office will arrange appt for pharmacy in our office to review Eliquis    Contact information:   [address], [place] [phone]      Follow up with Pixie Casino, MD.   Specialty:  Cardiology   Why:  office will call about stress test and follow up   Contact information:   [place] [address] [phone]       Discharge Medications:    Medication List    STOP taking these medications        aspirin 81 MG tablet      TAKE these medications        amLODipine 5 MG tablet  Commonly known as:  NORVASC  Take 1 tablet (5 mg total) by mouth daily.     apixaban 5 MG Tabs tablet  Commonly known as:  ELIQUIS  Take 1 tablet (5 mg total) by mouth 2 (two)  times daily.     azelastine 0.1 % nasal spray  Commonly known as:  ASTELIN  USE 2 SPRAYS IN EACH NOSTRIL 2 TIMES A DAY AS DIRECTED     CALCIUM-VITAMIN D PO  Take 1 each by mouth daily.     fexofenadine 180 MG tablet  Commonly known as:  ALLEGRA  Take 180 mg by mouth daily as needed for allergies or rhinitis.     hydrocortisone 2.5 % cream  Apply topically 2 (two) times daily.     multivitamin,tx-minerals tablet  Take 1 tablet by mouth daily.     PRESERVISION AREDS 2 Caps  Take 2 capsules by mouth 2 (two) times daily.     pravastatin 40 MG tablet  Commonly known as:  PRAVACHOL  TAKE 1 TABLET BY MOUTH DAILY     Testosterone 20.25 MG/ACT (1.62%) Gel  Commonly known as:  ANDROGEL PUMP  USE 5 PUMPS ON THE SKIN EVERY DAY         Duration of Discharge  Encounter: Greater than 30 minutes including physician time.  Angelena Form PA-C 06/25/2014 10:01 AM

## 2014-06-25 NOTE — Discharge Instructions (Signed)
Our office will call you for an appointment in the atrial fib clinic for this coming week as well as a Myoview stress test and lab work. Atrial Fibrillation Atrial fibrillation is a type of irregular heart rhythm (arrhythmia). During atrial fibrillation, the upper chambers of the heart (atria) quiver continuously in a chaotic pattern. This causes an irregular and often rapid heart rate.  Atrial fibrillation is the result of the heart becoming overloaded with disorganized signals that tell it to beat. These signals are normally released one at a time by a part of the right atrium called the sinoatrial node. They then travel from the atria to the lower chambers of the heart (ventricles), causing the atria and ventricles to contract and pump blood as they pass. In atrial fibrillation, parts of the atria outside of the sinoatrial node also release these signals. This results in two problems. First, the atria receive so many signals that they do not have time to fully contract. Second, the ventricles, which can only receive one signal at a time, beat irregularly and out of rhythm with the atria.  There are three types of atrial fibrillation:   Paroxysmal. Paroxysmal atrial fibrillation starts suddenly and stops on its own within a week.  Persistent. Persistent atrial fibrillation lasts for more than a week. It may stop on its own or with treatment.  Permanent. Permanent atrial fibrillation does not go away. Episodes of atrial fibrillation may lead to permanent atrial fibrillation. Atrial fibrillation can prevent your heart from pumping blood normally. It increases your risk of stroke and can lead to heart failure.  CAUSES   Heart conditions, including a heart attack, heart failure, coronary artery disease, and heart valve conditions.   Inflammation of the sac that surrounds the heart (pericarditis).  Blockage of an artery in the lungs (pulmonary embolism).  Pneumonia or other infections.  Chronic  lung disease.  Thyroid problems, especially if the thyroid is overactive (hyperthyroidism).  Caffeine, excessive alcohol use, and use of some illegal drugs.   Use of some medicines, including certain decongestants and diet pills.  Heart surgery.   Birth defects.  Sometimes, no cause can be found. When this happens, the atrial fibrillation is called lone atrial fibrillation. The risk of complications from atrial fibrillation increases if you have lone atrial fibrillation and you are age 81 years or older. RISK FACTORS  Heart failure.  Coronary artery disease.  Diabetes mellitus.   High blood pressure (hypertension).   Obesity.   Other arrhythmias.   Increased age. SIGNS AND SYMPTOMS   A feeling that your heart is beating rapidly or irregularly.   A feeling of discomfort or pain in your chest.   Shortness of breath.   Sudden light-headedness or weakness.   Getting tired easily when exercising.   Urinating more often than normal (mainly when atrial fibrillation first begins).  In paroxysmal atrial fibrillation, symptoms may start and suddenly stop. DIAGNOSIS  Your health care provider may be able to detect atrial fibrillation when taking your pulse. Your health care provider may have you take a test called an ambulatory electrocardiogram (ECG). An ECG records your heartbeat patterns over a 24-hour period. You may also have other tests, such as:  Transthoracic echocardiogram (TTE). During echocardiography, sound waves are used to evaluate how blood flows through your heart.  Transesophageal echocardiogram (TEE).  Stress test. There is more than one type of stress test. If a stress test is needed, ask your health care provider about which type is best  for you.  Chest X-ray exam.  Blood tests.  Computed tomography (CT). TREATMENT  Treatment may include:  Treating any underlying conditions. For example, if you have an overactive thyroid, treating the  condition may correct atrial fibrillation.  Taking medicine. Medicines may be given to control a rapid heart rate or to prevent blood clots, heart failure, or a stroke.  Having a procedure to correct the rhythm of the heart:  Electrical cardioversion. During electrical cardioversion, a controlled, low-energy shock is delivered to the heart through your skin. If you have chest pain, very low blood pressure, or sudden heart failure, this procedure may need to be done as an emergency.  Catheter ablation. During this procedure, heart tissues that send the signals that cause atrial fibrillation are destroyed.  Surgical ablation. During this surgery, thin lines of heart tissue that carry the abnormal signals are destroyed. This procedure can either be an open-heart surgery or a minimally invasive surgery. With the minimally invasive surgery, small cuts are made to access the heart instead of a large opening.  Pulmonary venous isolation. During this surgery, tissue around the veins that carry blood from the lungs (pulmonary veins) is destroyed. This tissue is thought to carry the abnormal signals. HOME CARE INSTRUCTIONS   Take medicines only as directed by your health care provider. Some medicines can make atrial fibrillation worse or recur.  If blood thinners were prescribed by your health care provider, take them exactly as directed. Too much blood-thinning medicine can cause bleeding. If you take too little, you will not have the needed protection against stroke and other problems.  Perform blood tests at home if directed by your health care provider. Perform blood tests exactly as directed.  Quit smoking if you smoke.  Do not drink alcohol.  Do not drink caffeinated beverages such as coffee, soda, and some teas. You may drink decaffeinated coffee, soda, or tea.   Maintain a healthy weight.Do not use diet pills unless your health care provider approves. They may make heart problems worse.    Follow diet instructions as directed by your health care provider.  Exercise regularly as directed by your health care provider.  Keep all follow-up visits as directed by your health care provider. This is important. PREVENTION  The following substances can cause atrial fibrillation to recur:   Caffeinated beverages.  Alcohol.  Certain medicines, especially those used for breathing problems.  Certain herbs and herbal medicines, such as those containing ephedra or ginseng.  Illegal drugs, such as cocaine and amphetamines. Sometimes medicines are given to prevent atrial fibrillation from recurring. Proper treatment of any underlying condition is also important in helping prevent recurrence.  SEEK MEDICAL CARE IF:  You notice a change in the rate, rhythm, or strength of your heartbeat.  You suddenly begin urinating more frequently.  You tire more easily when exerting yourself or exercising. SEEK IMMEDIATE MEDICAL CARE IF:   You have chest pain, abdominal pain, sweating, or weakness.  You feel nauseous.  You have shortness of breath.  You suddenly have swollen feet and ankles.  You feel dizzy.  Your face or limbs feel numb or weak.  You have a change in your vision or speech. MAKE SURE YOU:   Understand these instructions.  Will watch your condition.  Will get help right away if you are not doing well or get worse. Document Released: 03/10/2005 Document Revised: 07/25/2013 Document Reviewed: 04/20/2012 [facility] Patient Information 2015 [place], [place]. This information is not intended to replace advice  given to you by your health care provider. Make sure you discuss any questions you have with your health care provider.   Take eliquis twice a day  If you develop any bleeding call [phone],  This medicine is to prevent strokes. We gave you a 30 day free card for first month.  We will arrange a sleep study as well, these are done at [facility] -our  office will call with date and time.

## 2014-06-25 NOTE — Discharge Planning (Signed)
PIV d/c'd catheter tip intact. D/C instructions given with no questions at the current time. Will d/c via wc to private vehicle.

## 2014-06-25 NOTE — Telephone Encounter (Signed)
Per nurse, patient's wife called, previous Norvasc and eliquis sent to pharmacy on discharge did not go through. I have resent the medication.  Hilbert Corrigan PA Pager: [phone]

## 2014-06-26 ENCOUNTER — Telehealth: Payer: Self-pay | Admitting: *Deleted

## 2014-06-26 NOTE — Telephone Encounter (Signed)
Faxed PA for eliquis 5mg  BID [facility]

## 2014-06-26 NOTE — Telephone Encounter (Signed)
Patient recently hospitalized for a-fib with RVR.  Called patient to arrange hospital follow-up and patient states he is following up with cardiology.  Would you like to see patient as well?

## 2014-06-26 NOTE — Telephone Encounter (Signed)
Eliquis 5mg  BID approved thru 06/26/2015 under medicare part D

## 2014-06-26 NOTE — Telephone Encounter (Signed)
no, is okay, needs cardiology follow-up, follow-up here if needed only

## 2014-06-27 ENCOUNTER — Telehealth: Payer: Self-pay | Admitting: Internal Medicine

## 2014-06-28 ENCOUNTER — Ambulatory Visit (HOSPITAL_COMMUNITY)
Admission: RE | Admit: 2014-06-28 | Discharge: 2014-06-28 | Disposition: A | Discharge status: Home / Self Care | Payer: Medicare Other | Source: Ambulatory Visit | Attending: Cardiovascular Disease | Admitting: Cardiovascular Disease

## 2014-06-28 DIAGNOSIS — I4891 Unspecified atrial fibrillation: Secondary | ICD-10-CM | POA: Diagnosis not present

## 2014-06-28 DIAGNOSIS — R7989 Other specified abnormal findings of blood chemistry: Secondary | ICD-10-CM | POA: Diagnosis not present

## 2014-06-28 DIAGNOSIS — N289 Disorder of kidney and ureter, unspecified: Secondary | ICD-10-CM | POA: Diagnosis not present

## 2014-06-28 DIAGNOSIS — J449 Chronic obstructive pulmonary disease, unspecified: Secondary | ICD-10-CM | POA: Diagnosis not present

## 2014-06-28 DIAGNOSIS — J8410 Pulmonary fibrosis, unspecified: Secondary | ICD-10-CM | POA: Insufficient documentation

## 2014-06-28 DIAGNOSIS — R778 Other specified abnormalities of plasma proteins: Secondary | ICD-10-CM

## 2014-06-28 MED ORDER — REGADENOSON 0.4 MG/5ML IV SOLN
0.4000 mg | Freq: Once | INTRAVENOUS | Status: AC
  Administered 2014-06-28: 0.4 mg via INTRAVENOUS

## 2014-06-28 MED ORDER — TECHNETIUM TC 99M SESTAMIBI GENERIC - CARDIOLITE
30.0000 | Freq: Once | INTRAVENOUS | Status: AC | PRN
  Administered 2014-06-28: 30 via INTRAVENOUS

## 2014-06-28 MED ORDER — TECHNETIUM TC 99M SESTAMIBI GENERIC - CARDIOLITE
10.2000 | Freq: Once | INTRAVENOUS | Status: AC | PRN
  Administered 2014-06-28: 10 via INTRAVENOUS

## 2014-06-28 NOTE — Procedures (Addendum)
[facility] [address] [place] [address] [phone]  Cardiology Nuclear Med Study  [name] is a 81 y.o. male     MRN : [id number]     DOB: 01-Jun-1932  Procedure Date: 06/28/2014  Nuclear Med Background Indication for Stress Test:  Evaluation for Ischemia and Post Hospital History:  COPD and AFIB w/RVR;Pulmonary Fibrosis;enal Insuff. Cardiac Risk Factors: History of Smoking and Lipids  Symptoms:  Fatigue   Nuclear Pre-Procedure Caffeine/Decaff Intake:  7:00pm NPO After: 3:00am   IV Site: R Forearm  IV 0.9% NS with Angio Cath:  22g  Chest Size (in):  40" IV Started by: Rolene Course, RN  Height: 5\' 11"  (1.803 m)  Cup Size: n/a  BMI:  Body mass index is 25.67 kg/(m^2). Weight:  184 lb (83.462 kg)   Tech Comments:  n/a    Nuclear Med Study 1 or 2 day study: 1 day  Stress Test Type:  [place] Authorizing Provider:  Lyman Bishop, MD   Resting Radionuclide: Technetium 32m Sestamibi  Resting Radionuclide Dose: 10.2 mCi   Stress Radionuclide:  Technetium 58m Sestamibi  Stress Radionuclide Dose: 31.2 mCi           Stress Protocol Rest HR:60 Stress HR: 88  Rest BP: 122/74 Stress BP: 122/74  Exercise Time (min): n/a METS: n/a          Dose of Adenosine (mg):  n/a Dose of Lexiscan: 0.4 mg  Dose of Atropine (mg): n/a Dose of Dobutamine: n/a mcg/kg/min (at max HR)  Stress Test Technologist: Mellody Memos, CCT Nuclear Technologist:Elizabeth Young,CNMT   Rest Procedure:  Myocardial perfusion imaging was performed at rest 45 minutes following the intravenous administration of Technetium 43m Sestamibi. Stress Procedure:  The patient received IV Lexiscan 0.4 mg over 15-seconds.  Technetium 61m Sestamibi injected IV at 30-seconds.  There were no significant changes with Lexiscan.  Quantitative spect images were obtained after a 45 minute delay.  Transient Ischemic Dilatation (Normal <1.22):   1.06  QGS EDV:  82 ml QGS ESV:  36 ml LV Ejection Fraction: 57%     Rest ECG: NSR with incomplete RBBB  Stress ECG: No significant change from baseline ECG  QPS Raw Data Images:  Normal; no motion artifact; normal heart/lung ratio. Stress Images:  Small region of mild thinning in the distal inferior wall Rest Images:  Smal in size and very mild in intensity distal inferolateral thinning Subtraction (SDS):  A very small region of mild reversibility/ ischemia cannot be excluded  Impression Exercise Capacity:  Lexiscan with no exercise. BP Response:  Normal blood pressure response. Clinical Symptoms:  No significant symptoms noted. ECG Impression:  No significant ST segment change suggestive of ischemia. Comparison with Prior Nuclear Study: No previous nuclear study performed  Overall Impression:  Low risk stress nuclear study demonstrating a small region of probable inferior attenuation artifact but a small region of mild associated ischemia cannot  be excluded.  LV Wall Motion:  NL LV Function, EF 57%; NL Wall Motion and systolic thickening in all segments on slice imaging.   Troy Sine, MD  06/28/2014 1:01 PM

## 2014-06-28 NOTE — Telephone Encounter (Signed)
Closed encounter °

## 2014-06-30 ENCOUNTER — Ambulatory Visit (HOSPITAL_COMMUNITY)
Admission: RE | Admit: 2014-06-30 | Discharge: 2014-06-30 | Disposition: A | Discharge status: Home / Self Care | Payer: Medicare Other | Source: Ambulatory Visit | Attending: Nurse Practitioner | Admitting: Nurse Practitioner

## 2014-06-30 ENCOUNTER — Other Ambulatory Visit: Payer: Self-pay

## 2014-06-30 ENCOUNTER — Encounter (HOSPITAL_COMMUNITY): Payer: Self-pay | Admitting: Nurse Practitioner

## 2014-06-30 VITALS — BP 122/78 | HR 80 | Ht 71.0 in | Wt 188.8 lb

## 2014-06-30 DIAGNOSIS — I480 Paroxysmal atrial fibrillation: Secondary | ICD-10-CM

## 2014-06-30 DIAGNOSIS — J8410 Pulmonary fibrosis, unspecified: Secondary | ICD-10-CM | POA: Diagnosis not present

## 2014-06-30 DIAGNOSIS — R0683 Snoring: Secondary | ICD-10-CM | POA: Diagnosis not present

## 2014-06-30 DIAGNOSIS — E785 Hyperlipidemia, unspecified: Secondary | ICD-10-CM | POA: Insufficient documentation

## 2014-06-30 DIAGNOSIS — Z7902 Long term (current) use of antithrombotics/antiplatelets: Secondary | ICD-10-CM | POA: Insufficient documentation

## 2014-06-30 DIAGNOSIS — J449 Chronic obstructive pulmonary disease, unspecified: Secondary | ICD-10-CM | POA: Diagnosis not present

## 2014-06-30 DIAGNOSIS — E291 Testicular hypofunction: Secondary | ICD-10-CM | POA: Diagnosis not present

## 2014-06-30 DIAGNOSIS — Z79899 Other long term (current) drug therapy: Secondary | ICD-10-CM | POA: Insufficient documentation

## 2014-06-30 DIAGNOSIS — Z87891 Personal history of nicotine dependence: Secondary | ICD-10-CM | POA: Diagnosis not present

## 2014-06-30 DIAGNOSIS — I4891 Unspecified atrial fibrillation: Secondary | ICD-10-CM | POA: Diagnosis present

## 2014-06-30 LAB — BASIC METABOLIC PANEL
ANION GAP: 5 (ref 5–15)
BUN: 15 mg/dL (ref 6–23)
CHLORIDE: 100 mmol/L (ref 96–112)
CO2: 33 mmol/L — ABNORMAL HIGH (ref 19–32)
Calcium: 9.5 mg/dL (ref 8.4–10.5)
Creatinine, Ser: 1.13 mg/dL (ref 0.50–1.35)
GFR calc non Af Amer: 60 mL/min — ABNORMAL LOW (ref >90)
GFR, EST AFRICAN AMERICAN: 69 mL/min — AB (ref >90)
Glucose, Bld: 104 mg/dL — ABNORMAL HIGH (ref 70–99)
Potassium: 4.8 mmol/L (ref 3.5–5.1)
SODIUM: 138 mmol/L (ref 135–145)

## 2014-06-30 MED ORDER — DILTIAZEM HCL 30 MG PO TABS: ORAL_TABLET | ORAL | Status: DC

## 2014-06-30 NOTE — Progress Notes (Signed)
Patient ID: Joshua Bullock, male   DOB: 08-Jun-1932, 81 y.o.   MRN: [id number]   Primary Care Physician: Joshua November, MD Referring Physician:  JAYSHUN Bullock is a 81 y.o. male with a h/o COPD and pulmonary fibrosis, HLD, and hypogonadism admitted 06/23/14 with weakness and diaphoresis for 2 days. He went to PCP and was found to be in new onset Afib with v rates 145. Chadsvasc score of 2, started on Eliquis.His wife reported that he has significant snoring history and has apneic spells. Sleep study has been ordered. He had stress test as outpatient and was low risk scan.  On f/u visit today, he reports no further weakness or sensation of irregularlar heat beat.Tolerating blood thinners.Bmet is pending today and has f/u with Joshua Bullock early May. He uses decaf products, no tobacco or significant alcohol products.No obvious triggers other than possible sleep apnea.  He denies symptoms of palpitations, chest pain, shortness of breath, orthopnea, PND, lower extremity edema, dizziness, presyncope, syncope, or neurologic sequela. The patient is tolerating medications without difficulties and is otherwise without complaint today.   Past Medical History  Diagnosis Date  . Hyperlipidemia   . Arthritis   . COPD (chronic obstructive pulmonary disease) 05/09/2011    Former smoker, fine crackles at bases on exam. PFTs 2012 documented mild-moderate dz    . Hypogonadism male 09/01/2012  . DECREASED HEARING 11/05/2009    Qualifier: Diagnosis of  By: Marijean Niemann CMA, [place]    . OSTEOPENIA 11/15/2007    Per DEXA 2008    . HYPERGLYCEMIA 05/17/2008    Qualifier: Diagnosis of  By: Larose Kells MD, [place]    Past Surgical History  Procedure Laterality Date  . Tonsillectomy  1955    Current Outpatient Prescriptions  Medication Sig Dispense Refill  . amLODipine (NORVASC) 5 MG tablet Take 1 tablet (5 mg total) by mouth daily. 30 tablet 11  . apixaban (ELIQUIS) 5 MG TABS tablet Take 1 tablet (5 mg total) by mouth 2  (two) times daily. 60 tablet 6  . azelastine (ASTELIN) 0.1 % nasal spray USE 2 SPRAYS IN EACH NOSTRIL 2 TIMES A DAY AS DIRECTED 30 mL 3  . CALCIUM-VITAMIN D PO Take 1 each by mouth daily.      . fexofenadine (ALLEGRA) 180 MG tablet Take 180 mg by mouth daily as needed for allergies or rhinitis.    . hydrocortisone 2.5 % cream Apply topically 2 (two) times daily. 30 g 0  . Multiple Vitamins-Minerals (MULTIVITAMIN,TX-MINERALS) tablet Take 1 tablet by mouth daily.      . Multiple Vitamins-Minerals (PRESERVISION AREDS 2) CAPS Take 2 capsules by mouth 2 (two) times daily.    . pravastatin (PRAVACHOL) 40 MG tablet TAKE 1 TABLET BY MOUTH DAILY 90 tablet 1  . Testosterone (ANDROGEL PUMP) 20.25 MG/ACT (1.62%) GEL USE 5 PUMPS ON THE SKIN EVERY DAY 225 g 5  . diltiazem (CARDIZEM) 30 MG tablet Take 1 tablet every 4-6 hours AS NEEDED for fast heart rate. 30 tablet 2   No current facility-administered medications for this encounter.    Allergies  Allergen Reactions  . Influenza Virus Vacc Split Pf Other (See Comments)    Hospitalized with temperatures above 103 and classic symptoms of flu  . Tetanus Toxoid Hives    History   Social History  . Marital Status: Married    Spouse Name: N/A  . Number of Children: 1  . Years of Education: N/A   Occupational History  . retired  Social History Main Topics  . Smoking status: Former Research scientist (life sciences)  . Smokeless tobacco: Never Used     Comment: quit 2009  . Alcohol Use: 0.0 oz/week    0 Standard drinks or equivalent per week     Comment: 1-2 a month wine or beer  . Drug Use: No  . Sexual Activity: Not on file   Other Topics Concern  . Not on file   Social History Narrative   Retired from [employer] 2015    Family History  Problem Relation Age of Onset  . Prostate cancer Neg Hx   . Colon cancer Neg Hx   . Hypertension Father   . Arthritis Sister   . Breast cancer Mother   . Hypertension Sister   . CAD Neg Hx     ROS- All systems are reviewed  and negative except as per the HPI above  Physical Exam: Filed Vitals:   06/30/14 1106  BP: 122/78  Pulse: 80  Height: 5\' 11"  (1.803 m)  Weight: 188 lb 12.8 oz (85.639 kg)    GEN- The patient is well appearing, alert and oriented x 3 today.   Head- normocephalic, atraumatic Eyes-  Sclera clear, conjunctiva pink Ears- hearing intact Oropharynx- clear Neck- supple, no JVP Lymph- no cervical lymphadenopathy Lungs- Crackles  bilaterally, normal work of breathing Heart- Regular rate and rhythm, no murmurs, rubs or gallops, PMI not laterally displaced GI- soft, NT, ND, + BS Extremities- no clubbing, cyanosis, or edema MS- no significant deformity or atrophy Skin- no rash or lesion Psych- euthymic mood, full affect Neuro- strength and sensation are intact  EKG-NSR with IRBBB, 85 bpm.  Assessment and Plan: 1. PAF- new onset Maintaining SR. Planning to go to beach next week. Given  RX for 30 mg diltiazem po to use if needed one tab every 4-6 hours for elevated heart rate.  2. Chadsvasc score of 2 Continue eliquis 5 mg bid Stop asa. Bleeding precautions discussed.  3. Snoring with apnea Sleep study pending  4. BMP drawn today as mentioned in d/c summary. F/U with Joshua Bullock as scheduled 5/4 Afub clinic as needed

## 2014-06-30 NOTE — Patient Instructions (Addendum)
Follow up with Dr. Lysbeth Penner office regarding sleep study.  [phone]  Your physician has recommended you make the following change in your medication:  1)diltazem 30mg  -- can take 1 tablet every 4 -6 hours as needed for fast heart rate 2) stop aspirin

## 2014-07-03 ENCOUNTER — Other Ambulatory Visit (HOSPITAL_COMMUNITY): Payer: Self-pay | Admitting: *Deleted

## 2014-07-03 MED ORDER — DILTIAZEM HCL 30 MG PO TABS: ORAL_TABLET | ORAL | Status: DC

## 2014-07-05 ENCOUNTER — Ambulatory Visit (INDEPENDENT_AMBULATORY_CARE_PROVIDER_SITE_OTHER): Payer: Medicare Other | Admitting: Endocrinology

## 2014-07-05 ENCOUNTER — Encounter: Payer: Self-pay | Admitting: Endocrinology

## 2014-07-05 VITALS — BP 138/86 | HR 83 | Temp 97.7°F | Ht 71.0 in | Wt 189.0 lb

## 2014-07-05 DIAGNOSIS — E291 Testicular hypofunction: Secondary | ICD-10-CM

## 2014-07-05 LAB — TESTOSTERONE: TESTOSTERONE: 246.03 ng/dL — AB (ref 300.00–890.00)

## 2014-07-05 NOTE — Progress Notes (Signed)
Subjective:    Patient ID: Joshua Bullock, male    DOB: 01-24-1933, 81 y.o.   MRN: [id number]  HPI Pt returns for f/u of idiopathic central hypogonadism (dx'ed 2013; pituitary MRI was normal; he has 1 biological child (now age 109), which was born after a neg w/u of infertility; he has never been rx'ed for hypogonadism prior to eval here; he has never taken illicit androgens; testosterone level did not respond to clomid, so he was changed to androgel).  pt states he feels better in general, since recent hospitalization from AF (which was attributed to sleep apnea) Past Medical History  Diagnosis Date  . Hyperlipidemia   . Arthritis   . COPD (chronic obstructive pulmonary disease) 05/09/2011    Former smoker, fine crackles at bases on exam. PFTs 2012 documented mild-moderate dz    . Hypogonadism male 09/01/2012  . DECREASED HEARING 11/05/2009    Qualifier: Diagnosis of  By: Marijean Niemann CMA, Danielle    . OSTEOPENIA 11/15/2007    Per DEXA 2008    . HYPERGLYCEMIA 05/17/2008    Qualifier: Diagnosis of  By: Larose Kells MD, [place]     Past Surgical History  Procedure Laterality Date  . Tonsillectomy  1955    History   Social History  . Marital Status: Married    Spouse Name: N/A  . Number of Children: 1  . Years of Education: N/A   Occupational History  . retired     Social History Main Topics  . Smoking status: Former Research scientist (life sciences)  . Smokeless tobacco: Never Used     Comment: quit 2009  . Alcohol Use: 0.0 oz/week    0 Standard drinks or equivalent per week     Comment: 1-2 a month wine or beer  . Drug Use: No  . Sexual Activity: Not on file   Other Topics Concern  . Not on file   Social History Narrative   Retired from [employer] 2015    Current Outpatient Prescriptions on File Prior to Visit  Medication Sig Dispense Refill  . amLODipine (NORVASC) 5 MG tablet Take 1 tablet (5 mg total) by mouth daily. 30 tablet 11  . apixaban (ELIQUIS) 5 MG TABS tablet Take 1 tablet (5 mg total) by  mouth 2 (two) times daily. 60 tablet 6  . azelastine (ASTELIN) 0.1 % nasal spray USE 2 SPRAYS IN EACH NOSTRIL 2 TIMES A DAY AS DIRECTED 30 mL 3  . CALCIUM-VITAMIN D PO Take 1 each by mouth daily.      Marland Kitchen diltiazem (CARDIZEM) 30 MG tablet Take 1 tablet every 4-6 hours AS NEEDED for fast heart rate. 30 tablet 2  . fexofenadine (ALLEGRA) 180 MG tablet Take 180 mg by mouth daily as needed for allergies or rhinitis.    . hydrocortisone 2.5 % cream Apply topically 2 (two) times daily. 30 g 0  . Multiple Vitamins-Minerals (MULTIVITAMIN,TX-MINERALS) tablet Take 1 tablet by mouth daily.      . Multiple Vitamins-Minerals (PRESERVISION AREDS 2) CAPS Take 2 capsules by mouth 2 (two) times daily.    . pravastatin (PRAVACHOL) 40 MG tablet TAKE 1 TABLET BY MOUTH DAILY 90 tablet 1  . Testosterone (ANDROGEL PUMP) 20.25 MG/ACT (1.62%) GEL USE 5 PUMPS ON THE SKIN EVERY DAY 225 g 5   No current facility-administered medications on file prior to visit.    Allergies  Allergen Reactions  . Influenza Virus Vacc Split Pf Other (See Comments)    Hospitalized with temperatures above 103 and classic  symptoms of flu  . Tetanus Toxoid Hives    Family History  Problem Relation Age of Onset  . Prostate cancer Neg Hx   . Colon cancer Neg Hx   . Hypertension Father   . Arthritis Sister   . Breast cancer Mother   . Hypertension Sister   . CAD Neg Hx     BP 138/86 mmHg  Pulse 83  Temp(Src) 97.7 F (36.5 C) (Oral)  Ht 5\' 11"  (1.803 m)  Wt 189 lb (85.73 kg)  BMI 26.37 kg/m2  Review of Systems Denies decreased urinary stream    Objective:   Physical Exam VITAL SIGNS:  See vs page GENERAL: no distress Ext: no edema   Lab Results  Component Value Date   TESTOSTERONE 246.03* 07/05/2014      Assessment & Plan:  Hypogonadism: improved on supplementation Sleep apnea: possibly exac by testosterone supplementation, so we won't increase for now  Patient is advised the following: Patient Instructions    blood tests are being requested for you today.  We'll contact you with results. It is possible that the androgel is increasing your sleep apnea.  Please continue to see a specialist for this.   Please return in 6 months.

## 2014-07-05 NOTE — Patient Instructions (Addendum)
blood tests are being requested for you today.  We'll contact you with results. It is possible that the androgel is increasing your sleep apnea.  Please continue to see a specialist for this.   Please return in 6 months.

## 2014-07-10 ENCOUNTER — Telehealth: Payer: Self-pay | Admitting: Internal Medicine

## 2014-07-10 NOTE — Telephone Encounter (Signed)
Phone call from the patient's dentist, he needs a couple of teeth removed, not urgently. The patient has a recent onset of Afib om Eliquis. I recommend to hold off for now any elective procedure at least until he sees his heart doctor in May 4. Then we can decide about a  elective extraction. The dentist reports that they areable to do extractions for the patients while on Coumadin, thus it may be possible to proceed with a single dental extraction on eliquis. rec not to hold anticoag w/o notify/discuss cards

## 2014-07-18 ENCOUNTER — Telehealth: Payer: Self-pay | Admitting: Internal Medicine

## 2014-07-18 NOTE — Telephone Encounter (Signed)
Caller name: Kage Relation to pt: self Call back number: [phone] Pharmacy:  Reason for call:   Patient states that he is wanting to have dental extractions but the dentist will not perform anything until they hear from Dr. Larose Kells. Patient thinks it is because of the medication that he is on

## 2014-07-18 NOTE — Telephone Encounter (Signed)
Spoke with Pt, informed Pt that Dr. Larose Kells spoke with his dentist and informed him that cardiology needs to make that decision on whether or not he stops taking Eliquis or not. Pt verbalized understanding.

## 2014-07-26 ENCOUNTER — Encounter: Payer: Self-pay | Admitting: Internal Medicine

## 2014-07-26 ENCOUNTER — Ambulatory Visit (INDEPENDENT_AMBULATORY_CARE_PROVIDER_SITE_OTHER): Payer: Medicare Other | Admitting: Internal Medicine

## 2014-07-26 ENCOUNTER — Telehealth: Payer: Self-pay

## 2014-07-26 VITALS — BP 120/70 | HR 81 | Ht 71.0 in | Wt 190.1 lb

## 2014-07-26 DIAGNOSIS — R7989 Other specified abnormal findings of blood chemistry: Secondary | ICD-10-CM | POA: Diagnosis not present

## 2014-07-26 DIAGNOSIS — E785 Hyperlipidemia, unspecified: Secondary | ICD-10-CM | POA: Diagnosis not present

## 2014-07-26 DIAGNOSIS — I4891 Unspecified atrial fibrillation: Secondary | ICD-10-CM | POA: Diagnosis not present

## 2014-07-26 DIAGNOSIS — R778 Other specified abnormalities of plasma proteins: Secondary | ICD-10-CM

## 2014-07-26 NOTE — Patient Instructions (Signed)
Your physician recommends that you schedule a follow-up appointment in 6 months with Dr. Debara Pickett.  Dr. Debara Pickett recommends that you take aspirin 81mg  daily.

## 2014-07-26 NOTE — Progress Notes (Signed)
OFFICE NOTE  Chief Complaint:  Hospital follow-up   Primary Care Physician: [name], MD  HPI:  Joshua Bullock is an 81 year old male with hx COPD and pulmonary fibrosis, HLD, and hypogonadism admitted 06/23/14 with weakness and diaphoresis for 2 days. Denies any h/o AF or known heart disease. Was feeling fine on Wednesday prior to admission, active, able to mow the lawn. woke up Thursday at 5am with palpitations and feeling weak. This persisted all day. On 06/23/14 he continued to feel weak. Went to PCP and found to be in AF with RVR. Sent to ER. In ER, ECG with AF with v-rate of 145. No ST-T changes. Troponin 0.08. Denied CP or HF symptoms. Started on diltiazem gtt and dropped rate down to about 100. Then given 50 mg of po lopressor and HR dropped into 40s transiently and SBP into 70s. Diltiazem gtt stopped. Pt admitted to step down. CHA2DS2VASc =2 and he was started on Eliquis. He was given one dose of Flecainide in the ED. He was hypotensive and required Neosynephrine for a few hours. He converted to sinus bradycardia. Echo done 06/24/14 revealed an EF of 60-65% with grade 2 diastolic dysfunction.  Joshua Bullock overall is doing well. On follow-up he was seen in the A. fib clinic by Roderic Palau, NP and his aspirin was stopped. He underwent a nuclear stress test which was negative for ischemia. He is scheduled for sleep study because there is significant concern for sleep apnea and hopefully treatment will reduce his risk of recurrent A. fib. It should be noted he did have elevated troponin which is thought to be rate related, but suggested maybe underlying coronary disease although it is likely nonobstructive.  PMHx:  Past Medical History  Diagnosis Date  . Hyperlipidemia   . Arthritis   . COPD (chronic obstructive pulmonary disease) 05/09/2011    Former smoker, fine crackles at bases on exam. PFTs 2012 documented mild-moderate dz    . Hypogonadism male 09/01/2012  . DECREASED HEARING  11/05/2009    Qualifier: Diagnosis of  By: Marijean Niemann CMA, [place]    . OSTEOPENIA 11/15/2007    Per DEXA 2008    . HYPERGLYCEMIA 05/17/2008    Qualifier: Diagnosis of  By: Larose Kells MD, [place]     Past Surgical History  Procedure Laterality Date  . Tonsillectomy  1955    FAMHx:  Family History  Problem Relation Age of Onset  . Prostate cancer Neg Hx   . Colon cancer Neg Hx   . Hypertension Father   . Arthritis Sister   . Breast cancer Mother   . Hypertension Sister   . CAD Neg Hx     SOCHx:   reports that he has quit smoking. He has never used smokeless tobacco. He reports that he drinks alcohol. He reports that he does not use illicit drugs.  ALLERGIES:  Allergies  Allergen Reactions  . Influenza Virus Vacc Split Pf Other (See Comments)    Hospitalized with temperatures above 103 and classic symptoms of flu  . Tetanus Toxoid Hives    ROS: A comprehensive review of systems was negative except for: Constitutional: positive for fatigue and Snoring and nonrestorative sleep  HOME MEDS: Current Outpatient Prescriptions  Medication Sig Dispense Refill  . amLODipine (NORVASC) 5 MG tablet Take 1 tablet (5 mg total) by mouth daily. 30 tablet 11  . apixaban (ELIQUIS) 5 MG TABS tablet Take 1 tablet (5 mg total) by mouth 2 (two) times daily.  60 tablet 6  . aspirin EC 81 MG tablet Take 81 mg by mouth daily.    Marland Kitchen azelastine (ASTELIN) 0.1 % nasal spray USE 2 SPRAYS IN EACH NOSTRIL 2 TIMES A DAY AS DIRECTED 30 mL 3  . CALCIUM-VITAMIN D PO Take 1 each by mouth daily.      Marland Kitchen diltiazem (CARDIZEM) 30 MG tablet Take 1 tablet every 4-6 hours AS NEEDED for fast heart rate. 30 tablet 2  . fexofenadine (ALLEGRA) 180 MG tablet Take 180 mg by mouth daily as needed for allergies or rhinitis.    . hydrocortisone cream 1 % Apply 1 application topically 2 (two) times daily.    . Multiple Vitamins-Minerals (MULTIVITAMIN,TX-MINERALS) tablet Take 1 tablet by mouth daily.      . Multiple Vitamins-Minerals  (PRESERVISION AREDS 2) CAPS Take 2 capsules by mouth 2 (two) times daily.    . pravastatin (PRAVACHOL) 40 MG tablet TAKE 1 TABLET BY MOUTH DAILY 90 tablet 1  . Testosterone (ANDROGEL PUMP) 20.25 MG/ACT (1.62%) GEL USE 5 PUMPS ON THE SKIN EVERY DAY 225 g 5   No current facility-administered medications for this visit.    LABS/IMAGING: No results found for this or any previous visit (from the past 48 hour(s)). No results found.  WEIGHTS: Wt Readings from Last 3 Encounters:  07/26/14 190 lb 1.6 oz (86.229 kg)  07/05/14 189 lb (85.73 kg)  06/28/14 184 lb (83.462 kg)    VITALS: BP 120/70 mmHg  Pulse 81  Ht 5\' 11"  (1.803 m)  Wt 190 lb 1.6 oz (86.229 kg)  BMI 26.53 kg/m2  EXAM: General appearance: alert and no distress Neck: no carotid bruit, no JVD and thyroid not enlarged, symmetric, no tenderness/mass/nodules Lungs: clear to auscultation bilaterally Heart: regular rate and rhythm, S1, S2 normal, no murmur, click, rub or gallop Abdomen: soft, non-tender; bowel sounds normal; no masses,  no organomegaly Extremities: extremities normal, atraumatic, no cyanosis or edema Pulses: 2+ and symmetric Skin: Skin color, texture, turgor normal. No rashes or lesions Neurologic: Grossly normal Psych: Pleasant  EKG: Normal sinus rhythm at 81, incomplete right bundle branch pattern  ASSESSMENT: 1. Paroxysmal atrial fibrillation-CHADSVASC score of 2 on Eliquis 2. Elevated troponin-low risk Myoview 3. Probable obstructive sleep apnea 4. Dyslipidemia  PLAN: 1.   Joshua Bullock has not had recurrence of atrial fibrillation and is being maintained on his current medications with Cardizem as needed for breakthrough A. fib. He is taking Eliquis without any bleeding complications. Although stress test was low risk, he did have elevated troponin suggesting there may be underlying small vessel coronary disease. As Eliquis's not beneficial for the treatment of coronary disease, I would recommend he  continue low-dose aspirin. He does have a sleep study pending for about 6 weeks from now. Hopefully treatment of sleep apnea, if he is diagnosed, will help reduce the recurrence of A. fib events. Plan to see him back in 6 months - I'm happy to follow his atrial fibrillation, I do not see the need for further follow-up in the A. fib clinic as his atrial fibrillation is fairly well-controlled.  Pixie Casino, MD, [facility] Attending Cardiologist [place] 07/26/2014, 11:44 AM

## 2014-07-26 NOTE — Telephone Encounter (Signed)
Patient called in after office visit with Dr Debara Pickett. Patient is wanting to have teeth extracted and wanted to know if he needed to stop any medications.  Per Dr Debara Pickett:  STOP Eliquis 5 mg TWO DAYS PRIOR TO EXTRACTION STOP Aspirin 81 mg ONE WEEK PRIOR TO EXTRACTION  Told patient the above instructions and also sent patient a mychart message.

## 2014-07-27 ENCOUNTER — Ambulatory Visit: Payer: Medicare Other | Admitting: Internal Medicine

## 2014-07-28 ENCOUNTER — Encounter: Payer: Self-pay | Admitting: Internal Medicine

## 2014-07-28 ENCOUNTER — Telehealth: Payer: Self-pay

## 2014-07-28 ENCOUNTER — Ambulatory Visit (INDEPENDENT_AMBULATORY_CARE_PROVIDER_SITE_OTHER): Payer: Medicare Other | Admitting: Internal Medicine

## 2014-07-28 ENCOUNTER — Ambulatory Visit: Payer: Medicare Other | Admitting: Internal Medicine

## 2014-07-28 VITALS — BP 128/78 | HR 87 | Temp 98.0°F | Ht 71.0 in | Wt 182.2 lb

## 2014-07-28 DIAGNOSIS — F411 Generalized anxiety disorder: Secondary | ICD-10-CM | POA: Diagnosis not present

## 2014-07-28 DIAGNOSIS — R269 Unspecified abnormalities of gait and mobility: Secondary | ICD-10-CM

## 2014-07-28 MED ORDER — CITALOPRAM HYDROBROMIDE 20 MG PO TABS: 20.0000 mg | ORAL_TABLET | Freq: Every day | ORAL | Status: DC

## 2014-07-28 NOTE — Patient Instructions (Signed)
Start citalopram 20 mg tablets: The first 2 weeks take only half tablet at bedtime, then increase to 1 tablet at bedtime  Come back in 4-5 weeks for a checkup

## 2014-07-28 NOTE — Telephone Encounter (Signed)
Letter printed and signed by Dr. Larose Kells, given to Pt at [facility] on 07/28/2014.

## 2014-07-28 NOTE — Telephone Encounter (Signed)
-----   Message from Colon Branch, MD sent at 07/28/2014 12:13 PM EDT ----- On it may concern. Is okay for Joshua Bullock  STOP Eliquis 5 mg TWO DAYS PRIOR TO A DENTAL EXTRACTION  STOP Aspirin 81 mg ONE WEEK PRIOR TO A DENTAL  EXTRACTION   Restart as soon as possible

## 2014-07-28 NOTE — Progress Notes (Signed)
Pre visit review using our clinic review tool, if applicable. No additional management support is needed unless otherwise documented below in the visit note. 

## 2014-07-28 NOTE — Progress Notes (Signed)
Subjective:    Patient ID: Joshua Bullock, male    DOB: 1932-07-30, 81 y.o.   MRN: [id number]  DOS:  07/28/2014 Type of visit - description : here w/ wife, several concerns Interval history: Needs a letter to his dentist regards the dental extraction and stopping anticoagulants. He was diagnosed with A. fib/2016, since then the wife noticed his sleep is not the same, restless. I asked the patient how he is feeling emotionally, he said is okay however the wife reports that he has been very impatient. No apparent depression, no apparent problems with his memory. Wife is also concerned about his gait, he  looks wobbly. No recent falls. Also has a persistent dry cough. The patient is quite fidgety, the wife reports that that has been an ongoing problem, much worse since he quit tobacco years ago..    Review of Systems Low-grade fever? No chills. + Sneezing, itchy eyes and nose. No sinus pain or congestion No diplopia, slurred speech or motor deficits  Past Medical History  Diagnosis Date  . Hyperlipidemia   . Arthritis   . COPD (chronic obstructive pulmonary disease) 05/09/2011    Former smoker, fine crackles at bases on exam. PFTs 2012 documented mild-moderate dz    . Hypogonadism male 09/01/2012  . DECREASED HEARING 11/05/2009    Qualifier: Diagnosis of  By: Marijean Niemann CMA, [place]    . OSTEOPENIA 11/15/2007    Per DEXA 2008    . HYPERGLYCEMIA 05/17/2008    Qualifier: Diagnosis of  By: Larose Kells MD, [place]     Past Surgical History  Procedure Laterality Date  . Tonsillectomy  1955    History   Social History  . Marital Status: Married    Spouse Name: N/A  . Number of Children: 1  . Years of Education: N/A   Occupational History  . retired     Social History Main Topics  . Smoking status: Former Research scientist (life sciences)  . Smokeless tobacco: Never Used     Comment: quit 2009  . Alcohol Use: 0.0 oz/week    0 Standard drinks or equivalent per week     Comment: 1-2 a month wine or beer    . Drug Use: No  . Sexual Activity: Not on file   Other Topics Concern  . Not on file   Social History Narrative   Retired from [employer] 2015        Medication List       This list is accurate as of: 07/28/14 11:59 PM.  Always use your most recent med list.               amLODipine 5 MG tablet  Commonly known as:  NORVASC  Take 1 tablet (5 mg total) by mouth daily.     apixaban 5 MG Tabs tablet  Commonly known as:  ELIQUIS  Take 1 tablet (5 mg total) by mouth 2 (two) times daily.     aspirin EC 81 MG tablet  Take 81 mg by mouth daily.     azelastine 0.1 % nasal spray  Commonly known as:  ASTELIN  USE 2 SPRAYS IN EACH NOSTRIL 2 TIMES A DAY AS DIRECTED     CALCIUM-VITAMIN D PO  Take 1 each by mouth daily.     citalopram 20 MG tablet  Commonly known as:  CELEXA  Take 1 tablet (20 mg total) by mouth daily.     diltiazem 30 MG tablet  Commonly known as:  CARDIZEM  Take  1 tablet every 4-6 hours AS NEEDED for fast heart rate.     fexofenadine 180 MG tablet  Commonly known as:  ALLEGRA  Take 180 mg by mouth daily as needed for allergies or rhinitis.     hydrocortisone cream 1 %  Apply 1 application topically 2 (two) times daily.     multivitamin,tx-minerals tablet  Take 1 tablet by mouth daily.     PRESERVISION AREDS 2 Caps  Take 2 capsules by mouth 2 (two) times daily.     pravastatin 40 MG tablet  Commonly known as:  PRAVACHOL  TAKE 1 TABLET BY MOUTH DAILY     Testosterone 20.25 MG/ACT (1.62%) Gel  Commonly known as:  ANDROGEL PUMP  USE 5 PUMPS ON THE SKIN EVERY DAY           Objective:   Physical Exam BP 128/78 mmHg  Pulse 87  Temp(Src) 98 F (36.7 C) (Oral)  Ht 5\' 11"  (1.803 m)  Wt 182 lb 4 oz (82.668 kg)  BMI 25.43 kg/m2  SpO2 97% General:   Well developed, well nourished . NAD.  HEENT:  Normocephalic . Face symmetric, atraumatic. Nose is slightly congested Lungs:   Dry crackles at bases Normal respiratory effort, no intercostal  retractions, no accessory muscle use. Heart: irreg  No pretibial edema bilaterally  Skin: Not pale. Not jaundice Neurologic:  alert & oriented X3.  Speech normal, gait appropriate for age, even when not assisted by a cane. Good arm swing, able to turn without major problems. He has persistent fidgety, moving hands-legs-arms constantly Psych--  Cognition and judgment appear intact.  Cooperative with normal attention span and concentration.  Behavior appropriate. No anxious or depressed appearing.        Assessment & Plan:    Dental extractions, Okay to proceed with dental extractions following the recommendations by cardiology, a letter is provided to the patient.  Gait abnormality?Marland Kitchen Today he seems to be doing well, will reassess on return to the office and consider PT  Anxiety? The patient is getting quite impatient which may be a symptom of anxiety, he is restless when he sleeps. Denies depression.  I know him for years, he is fidgety but that seems to be much worse lately. Pt's wife is much more concern than pt himself Based on this information, I recommended a trial with SSRIs, he is in agreement. We'll reassess in 4-5 weeks.  Cough, He has well-known COPD, also symptoms of allergies. Suspect cough is related to COPD +/- allergies Recommend to continue Astelin, add Flonase, reassess in 4 weeks.

## 2014-07-30 DIAGNOSIS — F411 Generalized anxiety disorder: Secondary | ICD-10-CM | POA: Insufficient documentation

## 2014-08-02 ENCOUNTER — Telehealth: Payer: Self-pay

## 2014-08-02 NOTE — Telephone Encounter (Signed)
LMOM informing Pt to return call.  

## 2014-08-02 NOTE — Telephone Encounter (Signed)
-----   Message from Colon Branch, MD sent at 07/30/2014 11:55 AM EDT ----- Regarding: open phone note, call wednesday Was seen with cough, ?low grade fever. Please ask how he is doing, let me know

## 2014-08-07 NOTE — Telephone Encounter (Signed)
Please send a letter asking how he is doing

## 2014-08-07 NOTE — Telephone Encounter (Signed)
Routing error. Please see below. Thanks.

## 2014-08-07 NOTE — Telephone Encounter (Signed)
Unable to get in contact with Pt. Pt has 4-5 week F/U visit scheduled for 08/30/2014.

## 2014-08-07 NOTE — Telephone Encounter (Signed)
Letter printed and mailed to Pt.  

## 2014-08-14 ENCOUNTER — Telehealth: Payer: Self-pay | Admitting: Internal Medicine

## 2014-08-14 NOTE — Telephone Encounter (Signed)
FYI

## 2014-08-14 NOTE — Telephone Encounter (Signed)
Caller name: Alon Relation to pt: self Call back number: [phone] Pharmacy:  Reason for call:   Patient states that his cough is better and fever is back to normal but states that he is still a little unstable on his feet.  Calling because he received a letter from [place].

## 2014-08-14 NOTE — Telephone Encounter (Signed)
Very good, I was somewhat concerned about the fever. Continue taking the same medications and come back next month for a  reassessment as planned

## 2014-08-15 NOTE — Telephone Encounter (Signed)
LMOM informing Pt to return call.  

## 2014-08-15 NOTE — Telephone Encounter (Signed)
Pt has F/U appt scheduled for 08/30/2014 at 0830.

## 2014-08-24 ENCOUNTER — Telehealth: Payer: Self-pay

## 2014-08-24 MED ORDER — TESTOSTERONE 20.25 MG/ACT (1.62%) TD GEL: TRANSDERMAL | Status: DC

## 2014-08-24 NOTE — Telephone Encounter (Signed)
Rx for Androgel faxed to patients pharmacy.

## 2014-08-24 NOTE — Telephone Encounter (Signed)
i printed 

## 2014-08-24 NOTE — Telephone Encounter (Signed)
Received a refill request for Androgel 1.62.  Please advise if ok to refill thanks!

## 2014-08-25 ENCOUNTER — Ambulatory Visit: Payer: Medicare Other | Admitting: Internal Medicine

## 2014-08-30 ENCOUNTER — Encounter: Payer: Self-pay | Admitting: Internal Medicine

## 2014-08-30 ENCOUNTER — Ambulatory Visit (INDEPENDENT_AMBULATORY_CARE_PROVIDER_SITE_OTHER): Payer: Medicare Other | Admitting: Internal Medicine

## 2014-08-30 VITALS — BP 118/68 | HR 54 | Temp 97.6°F | Ht 71.0 in | Wt 184.5 lb

## 2014-08-30 DIAGNOSIS — R269 Unspecified abnormalities of gait and mobility: Secondary | ICD-10-CM | POA: Diagnosis not present

## 2014-08-30 DIAGNOSIS — F411 Generalized anxiety disorder: Secondary | ICD-10-CM

## 2014-08-30 DIAGNOSIS — J438 Other emphysema: Secondary | ICD-10-CM

## 2014-08-30 MED ORDER — DILTIAZEM HCL 30 MG PO TABS: ORAL_TABLET | ORAL | Status: DC

## 2014-08-30 NOTE — Assessment & Plan Note (Addendum)
Since the last visit is complaining of instability, feeling wobbly. He uses a cane consistently, no recent falls For now no change, we again discussed possibly doing physical therapy.

## 2014-08-30 NOTE — Assessment & Plan Note (Signed)
Patient was seen last month because he was quite impatient and fidgety, we felt he had anxiety, started Celexa, both the patient and his wife state that he has improved significantly. Exam is confirmatory. Plan: Continue SSRIs

## 2014-08-30 NOTE — Assessment & Plan Note (Addendum)
Was complaining about cough the last time he was here, has noted that when he drinks water the cough decreases. Also complaining of dry mouth. Plan: Discontinue Allegra to see if the help with the dry mouth Trial with Prilosec, GERD could be contributing to the cough

## 2014-08-30 NOTE — Progress Notes (Signed)
Subjective:    Patient ID: Joshua Bullock, male    DOB: [birth year], 81 y.o.   MRN: [id number]  DOS:  08/30/2014 Type of visit - description : f/u , here w/ wife Interval history: Since the last visit, he started SSRIs, the wife and the patient recognizes that he has improved significantly. Cough, ongoing problem, interestingly the patient has noted that if he drinks water the cough decreases. They also said that he is drinking lots of water,   he does not feel excessively thirsty but admits to dry mouth.    Review of Systems Continue to be slightly wobbly, no recent falls or accidents Wife is concerned about his cognition, he seems to be slightly slow but no actual major problems with lack of memory.  Past Medical History  Diagnosis Date  . Hyperlipidemia   . Arthritis   . COPD (chronic obstructive pulmonary disease) 05/09/2011    Former smoker, fine crackles at bases on exam. PFTs 2012 documented mild-moderate dz    . Hypogonadism male 09/01/2012  . DECREASED HEARING 11/05/2009    Qualifier: Diagnosis of  By: Marijean Niemann CMA, Danielle    . OSTEOPENIA 11/15/2007    Per DEXA 2008    . HYPERGLYCEMIA 05/17/2008    Qualifier: Diagnosis of  By: Larose Kells MD, [place]     Past Surgical History  Procedure Laterality Date  . Tonsillectomy  1955    History   Social History  . Marital Status: Married    Spouse Name: N/A  . Number of Children: 1  . Years of Education: N/A   Occupational History  . retired     Social History Main Topics  . Smoking status: Former Research scientist (life sciences)  . Smokeless tobacco: Never Used     Comment: quit 2009  . Alcohol Use: 0.0 oz/week    0 Standard drinks or equivalent per week     Comment: 1-2 a month wine or beer  . Drug Use: No  . Sexual Activity: Not on file   Other Topics Concern  . Not on file   Social History Narrative   Retired from [employer] 2015        Medication List       This list is accurate as of: 08/30/14 11:59 PM.  Always use your most recent  med list.               amLODipine 5 MG tablet  Commonly known as:  NORVASC  Take 1 tablet (5 mg total) by mouth daily.     apixaban 5 MG Tabs tablet  Commonly known as:  ELIQUIS  Take 1 tablet (5 mg total) by mouth 2 (two) times daily.     aspirin EC 81 MG tablet  Take 81 mg by mouth daily.     azelastine 0.1 % nasal spray  Commonly known as:  ASTELIN  USE 2 SPRAYS IN EACH NOSTRIL 2 TIMES A DAY AS DIRECTED     CALCIUM-VITAMIN D PO  Take 1 each by mouth daily.     citalopram 20 MG tablet  Commonly known as:  CELEXA  Take 1 tablet (20 mg total) by mouth daily.     diltiazem 30 MG tablet  Commonly known as:  CARDIZEM  Take 1 tablet every 4-6 hours AS NEEDED for fast heart rate.     hydrocortisone cream 1 %  Apply 1 application topically 2 (two) times daily.     multivitamin,tx-minerals tablet  Take 1 tablet by mouth daily.  PRESERVISION AREDS 2 Caps  Take 2 capsules by mouth 2 (two) times daily.     omeprazole 20 MG tablet  Commonly known as:  PRILOSEC OTC  Take 20 mg by mouth daily.     pravastatin 40 MG tablet  Commonly known as:  PRAVACHOL  TAKE 1 TABLET BY MOUTH DAILY     Testosterone 20.25 MG/ACT (1.62%) Gel  Commonly known as:  ANDROGEL PUMP  USE 5 PUMPS ON THE SKIN EVERY DAY           Objective:   Physical Exam BP 118/68 mmHg  Pulse 54  Temp(Src) 97.6 F (36.4 C) (Oral)  Ht 5\' 11"  (1.803 m)  Wt 184 lb 8 oz (83.689 kg)  BMI 25.74 kg/m2  SpO2 97% General:   Well developed, well nourished . NAD.  HEENT:  Normocephalic . Face symmetric, atraumatic Neurologic:  alert & oriented X3.  Speech normal, gait appropriate for age and assisted by a cane Much less fidgety than the last time he was here Psych--  Cognition and judgment appear intact.  Cooperative with normal attention span and concentration.  Behavior appropriate. No anxious or depressed appearing.        Assessment & Plan:    Patient's wife concerned about "cognition"  however there is no major decreasing memory. Recommend observation

## 2014-08-30 NOTE — Patient Instructions (Signed)
Stop Allegra  Continue with the 2 nose sprays  Take Prilosec (omeprazole) OTC 20 mg every morning before breakfast

## 2014-08-30 NOTE — Progress Notes (Signed)
Pre visit review using our clinic review tool, if applicable. No additional management support is needed unless otherwise documented below in the visit note. 

## 2014-09-06 ENCOUNTER — Ambulatory Visit (HOSPITAL_BASED_OUTPATIENT_CLINIC_OR_DEPARTMENT_OTHER): Payer: Medicare Other | Attending: Cardiology

## 2014-09-06 VITALS — Ht 71.0 in | Wt 184.0 lb

## 2014-09-06 DIAGNOSIS — R0683 Snoring: Secondary | ICD-10-CM | POA: Insufficient documentation

## 2014-09-06 DIAGNOSIS — G4733 Obstructive sleep apnea (adult) (pediatric): Secondary | ICD-10-CM | POA: Insufficient documentation

## 2014-09-06 DIAGNOSIS — G4730 Sleep apnea, unspecified: Secondary | ICD-10-CM | POA: Diagnosis not present

## 2014-09-06 DIAGNOSIS — I4891 Unspecified atrial fibrillation: Secondary | ICD-10-CM

## 2014-09-06 DIAGNOSIS — G4719 Other hypersomnia: Secondary | ICD-10-CM | POA: Diagnosis present

## 2014-09-17 NOTE — Addendum Note (Signed)
Addended by: Shelva Majestic A on: 09/17/2014 10:34 AM   Modules accepted: Level of Service

## 2014-09-17 NOTE — Sleep Study (Signed)
NAME: Joshua Bullock DATE OF BIRTH:  Oct 16, 1932 MEDICAL RECORD NUMBER [id number]  LOCATION: [facility]  PHYSICIAN: Taedyn Glasscock A  DATE OF STUDY: 09/06/2014  SLEEP STUDY TYPE: Nocturnal Polysomnogram               REFERRING PHYSICIAN: Isaiah Serge, NP  INDICATION FOR STUDY:  Mr. Joshua Bullock is an 81 -year-old male who has a history of atrial fibrillation.  He complains of snoring, fatigue, and daytime sleepiness.  He is referred for a sleep study to evaluate for obstructive sleep apnea.  EPWORTH SLEEPINESS SCORE:  6 HEIGHT: 5\' 11"  (180.3 cm)  WEIGHT: 184 lb (83.462 kg)    Body mass index is 25.67 kg/(m^2).  NECK SIZE: 16.5 in.  MEDICATIONS:  amLODipine (NORVASC) 5 MG tablet 5 mg, Daily apixaban (ELIQUIS) 5 MG TABS tablet 5 mg, 2 times daily aspirin EC 81 MG tablet 81 mg, Daily azelastine (ASTELIN) 0.1 % nasal spray CALCIUM-VITAMIN D PO 1 each, Daily citalopram (CELEXA) 20 MG tablet 20 mg, Daily diltiazem (CARDIZEM) 30 MG tablet hydrocortisone cream 1 % 1 application, 2 times daily Multiple Vitamins-Minerals (MULTIVITAMIN,TX-MINERALS) tablet 1 tablet, Daily Multiple Vitamins-Minerals (PRESERVISION AREDS 2) CAPS 2 capsule, 2 times daily omeprazole (PRILOSEC OTC) 20 MG tablet 20 mg, Daily pravastatin (PRAVACHOL) 40 MG tablet Testosterone (ANDROGEL PUMP) 20.25 MG/ACT (1.62%) GEL   SLEEP ARCHITECTURE:  Total recording time was 386 minutes.  Total sleep time 326 minutes.  Percent sleep efficiency was 84.5%.  Latency to sleep onset was normal at 17.5 minutes.  Latency to REM sleep was prolonged at 136 minutes.  The patient slept for 28.5 minutes in stage I (8.7%), 217.5 minutes in stage II (66.7%), 0 minutes in stage III, and 80 minutes in REM sleep (24.5%).  He slept 191 minutes (58.6%) and supine sleep.  There were 43 arousals with an index of 7.9.  There was mild snoring in the supine position  RESPIRATORY DATA:  During the  sleep time, there were a  total of 3 obstructive apneas, 0 central apneas, 0 mixed apneas, and 15 hypopneas.  The apnea plus hypopnea index ( AHI) was 3.3 per hour and the respiratory disturbance index (RDI) was 5.2 per hour.  However, the AHI during REM sleep was 11.3 per hour.  This places the patient in the category of probable increased upper airway resistance syndrome overall, but mild sleep apnea during REM sleep  OXYGEN DATA:  The baseline oxygen saturation was 91%.  The lowest oxygen saturation in non-REM sleep was 85% and in REM sleep was 84%.  CARDIAC DATA:  The patient was in sinus rhythm with an average heart rate at 53 bpm.  MOVEMENT/PARASOMNIA:  There were 0 periodic limb movements.  IMPRESSION/ RECOMMENDATION:   Increased upper airway resistance syndrome (UARS) overall, but with mild obstructive sleep apnea during REM sleep.  Events were more prominent with supine sleep. Respiratory events with decreased oxygen desaturation to 85% with non-REM sleep and 84% with REM sleep.  Mild snoring during supine sleep. No evidence for nocturnal myoclonus  At present, the patient does not meet criteria for CPAP use.   Alternatives for the treatment of snoring or mild sleep apnea during REM sleep should be considered.   The patient should be counseled to try avoiding sleep in the supine position since this was associated with the majority of events.   The patient should be counseled in good slight hygiene  Rushawn Capshaw A Diplomate, American Board of Sleep Medicine  ELECTRONICALLY SIGNED ON:  09/17/2014, 10:17 AM [facility] PH: [phone]   FX: [phone] [place]

## 2014-09-18 ENCOUNTER — Other Ambulatory Visit: Payer: Self-pay

## 2014-09-18 ENCOUNTER — Telehealth: Payer: Self-pay | Admitting: *Deleted

## 2014-09-18 MED ORDER — PRAVASTATIN SODIUM 40 MG PO TABS: 40.0000 mg | ORAL_TABLET | Freq: Every day | ORAL | Status: DC

## 2014-09-18 NOTE — Telephone Encounter (Signed)
-----   Message from Isaiah Serge, NP sent at 09/18/2014  7:36 AM EDT ----- Could you let pt know he has mild sleep apnea but no need for CPAP.  Sleeping on his side will help the little bit that he has.  Thanks.  ----- Message -----    From: Troy Sine, MD    Sent: 09/17/2014  10:33 AM      To: Isaiah Serge, NP

## 2014-09-18 NOTE — Telephone Encounter (Signed)
Sleep study results called to patient.  Recommended he sleep on his side and this will help with his mild symptoms.  Patient voiced understanding.

## 2014-11-13 ENCOUNTER — Other Ambulatory Visit: Payer: Self-pay | Admitting: Internal Medicine

## 2014-11-21 ENCOUNTER — Encounter: Payer: Self-pay | Admitting: Cardiovascular Disease

## 2014-11-29 ENCOUNTER — Ambulatory Visit (INDEPENDENT_AMBULATORY_CARE_PROVIDER_SITE_OTHER): Payer: Medicare Other | Admitting: Internal Medicine

## 2014-11-29 ENCOUNTER — Encounter: Payer: Self-pay | Admitting: Internal Medicine

## 2014-11-29 VITALS — BP 126/78 | HR 67 | Temp 97.7°F | Ht 71.0 in | Wt 191.4 lb

## 2014-11-29 DIAGNOSIS — F411 Generalized anxiety disorder: Secondary | ICD-10-CM | POA: Diagnosis not present

## 2014-11-29 DIAGNOSIS — J410 Simple chronic bronchitis: Secondary | ICD-10-CM

## 2014-11-29 DIAGNOSIS — D72829 Elevated white blood cell count, unspecified: Secondary | ICD-10-CM | POA: Diagnosis not present

## 2014-11-29 DIAGNOSIS — J449 Chronic obstructive pulmonary disease, unspecified: Secondary | ICD-10-CM

## 2014-11-29 DIAGNOSIS — Z09 Encounter for follow-up examination after completed treatment for conditions other than malignant neoplasm: Secondary | ICD-10-CM | POA: Insufficient documentation

## 2014-11-29 DIAGNOSIS — R269 Unspecified abnormalities of gait and mobility: Secondary | ICD-10-CM

## 2014-11-29 LAB — CBC WITH DIFFERENTIAL/PLATELET
Basophils Absolute: 0.1 10*3/uL (ref 0.0–0.1)
Basophils Relative: 0.5 % (ref 0.0–3.0)
EOS ABS: 0.3 10*3/uL (ref 0.0–0.7)
Eosinophils Relative: 3.5 % (ref 0.0–5.0)
HCT: 40.8 % (ref 39.0–52.0)
Hemoglobin: 13.8 g/dL (ref 13.0–17.0)
Lymphocytes Relative: 40.2 % (ref 12.0–46.0)
Lymphs Abs: 4 10*3/uL (ref 0.7–4.0)
MCHC: 33.9 g/dL (ref 30.0–36.0)
MCV: 92.9 fl (ref 78.0–100.0)
MONO ABS: 0.9 10*3/uL (ref 0.1–1.0)
Monocytes Relative: 9.3 % (ref 3.0–12.0)
NEUTROS ABS: 4.6 10*3/uL (ref 1.4–7.7)
Neutrophils Relative %: 46.5 % (ref 43.0–77.0)
PLATELETS: 243 10*3/uL (ref 150.0–400.0)
RBC: 4.39 Mil/uL (ref 4.22–5.81)
RDW: 14.5 % (ref 11.5–15.5)
WBC: 9.8 10*3/uL (ref 4.0–10.5)

## 2014-11-29 NOTE — Progress Notes (Signed)
Subjective:    Patient ID: Joshua Bullock, male    DOB: 04/10/1932, 81 y.o.   MRN: [id number]  DOS:  11/29/2014 Type of visit - description : Follow-up from previous visit, here with his wife Interval history: Anxiety: Continue under excellent control Was seen with cough, status post PPIs, cough decreased Last OV he complained about difficulty with his stability, since the cataract surgery improve his vision his gait is better. Had a sleep study 3 months ago, + mild sleep apnea.   Review of Systems No chest pain or difficulty breathing No lower extremity edema No nausea, vomiting, diarrhea  Past Medical History  Diagnosis Date  . Hyperlipidemia   . Arthritis   . COPD (chronic obstructive pulmonary disease) 05/09/2011    Former smoker, fine crackles at bases on exam. PFTs 2012 documented mild-moderate dz    . Hypogonadism male 09/01/2012  . DECREASED HEARING 11/05/2009    Qualifier: Diagnosis of  By: Marijean Niemann CMA, Danielle    . OSTEOPENIA 11/15/2007    Per DEXA 2008    . HYPERGLYCEMIA 05/17/2008    Qualifier: Diagnosis of  By: Larose Kells MD, [place]     Past Surgical History  Procedure Laterality Date  . Tonsillectomy  1955  . Eye surgery Bilateral 2016    Cataract    Social History   Social History  . Marital Status: Married    Spouse Name: N/A  . Number of Children: 1  . Years of Education: N/A   Occupational History  . retired     Social History Main Topics  . Smoking status: Former Research scientist (life sciences)  . Smokeless tobacco: Never Used     Comment: quit 2009  . Alcohol Use: 0.0 oz/week    0 Standard drinks or equivalent per week     Comment: 1-2 a month wine or beer  . Drug Use: No  . Sexual Activity: Not on file   Other Topics Concern  . Not on file   Social History Narrative   Retired from [employer] 2015        Medication List       This list is accurate as of: 11/29/14  5:02 PM.  Always use your most recent med list.               amLODipine 5 MG tablet    Commonly known as:  NORVASC  Take 1 tablet (5 mg total) by mouth daily.     apixaban 5 MG Tabs tablet  Commonly known as:  ELIQUIS  Take 1 tablet (5 mg total) by mouth 2 (two) times daily.     aspirin EC 81 MG tablet  Take 81 mg by mouth daily.     azelastine 0.1 % nasal spray  Commonly known as:  ASTELIN  SPRAY TWO SPRAY(S) IN EACH NOSTRIL TWICE DAILY AS DIRECTED     CALCIUM-VITAMIN D PO  Take 1 each by mouth daily.     citalopram 20 MG tablet  Commonly known as:  CELEXA  Take 10 mg by mouth daily.     diltiazem 30 MG tablet  Commonly known as:  CARDIZEM  Take 1 tablet every 4-6 hours AS NEEDED for fast heart rate.     hydrocortisone cream 1 %  Apply 1 application topically 2 (two) times daily.     multivitamin,tx-minerals tablet  Take 1 tablet by mouth daily.     PRESERVISION AREDS 2 Caps  Take 2 capsules by mouth 2 (two) times daily.  omeprazole 20 MG tablet  Commonly known as:  PRILOSEC OTC  Take 20 mg by mouth daily.     pravastatin 40 MG tablet  Commonly known as:  PRAVACHOL  Take 1 tablet (40 mg total) by mouth daily.     Testosterone 20.25 MG/ACT (1.62%) Gel  Commonly known as:  ANDROGEL PUMP  USE 5 PUMPS ON THE SKIN EVERY DAY           Objective:   Physical Exam BP 126/78 mmHg  Pulse 67  Temp(Src) 97.7 F (36.5 C) (Oral)  Ht 5\' 11"  (1.803 m)  Wt 191 lb 6 oz (86.807 kg)  BMI 26.70 kg/m2  SpO2 93% General:   Well developed, well nourished . NAD.  HEENT:  Normocephalic . Face symmetric, atraumatic Lungs:  Dry crackles at bases Normal respiratory effort, no intercostal retractions, no accessory muscle use. Heart: RRR,  no murmur.  No pretibial edema bilaterally  Skin: Not pale. Not jaundice Neurologic:  alert & oriented X3.  Speech normal, gait appropriate for age and unassisted Psych--  Cognition and judgment appear intact.  Cooperative with normal attention span and concentration.  Behavior appropriate. No anxious or depressed  appearing. No fidgety      Assessment & Plan:  A/P COPD, pulmonary fibrosis: Was recently seen with cough, symptoms better after a trial with PPIs. Chest x-ray few months ago showed changes of COPD, minimal symptoms, no recent PFTs. Recommend PFTs Sleep apnea: + Sleep study 08/2014, was mild and was not recommended a CPAP. Continue with somnolence, could be related to citalopram or Astelin. Anxiety: Under excellent control, he is somewhat somnolent, could be related to citalopram. PLAN: decrease citalopram to 1/2 po qd, see if somnolence decrease ; consider prozac Hypogonadism, on HRT, F/u  Dr. Loanne Drilling Recent leukocytosis: Check a CBC

## 2014-11-29 NOTE — Assessment & Plan Note (Signed)
COPD, pulmonary fibrosis: Was recently seen with cough, symptoms better after a trial with PPIs. Chest x-ray few months ago showed changes of COPD, minimal symptoms, no recent PFTs. Recommend PFTs Sleep apnea: + Sleep study 08/2014, was mild and was not recommended a CPAP. Continue with somnolence, could be related to citalopram or Astelin. Anxiety: Under excellent control, he is somewhat somnolent, could be related to citalopram. PLAN: decrease citalopram to 1/2 po qd, see if somnolence decrease ; consider prozac Hypogonadism, on HRT, F/u  Dr. Loanne Drilling Recent leukocytosis: Check a

## 2014-11-29 NOTE — Progress Notes (Signed)
Pre visit review using our clinic review tool, if applicable. No additional management support is needed unless otherwise documented below in the visit note. 

## 2014-11-29 NOTE — Patient Instructions (Addendum)
Get your blood work before you leave  Decreased   citalopram to half tablet daily  will schedule a test called PFTs    Next visit  for a   complete physical exam by March 2017 Please schedule an appointment at the front desk Please come back fasting

## 2014-12-01 ENCOUNTER — Ambulatory Visit: Payer: Medicare Other | Admitting: Internal Medicine

## 2014-12-04 DIAGNOSIS — Z0279 Encounter for issue of other medical certificate: Secondary | ICD-10-CM

## 2014-12-08 DIAGNOSIS — Z0279 Encounter for issue of other medical certificate: Secondary | ICD-10-CM

## 2014-12-14 ENCOUNTER — Ambulatory Visit (INDEPENDENT_AMBULATORY_CARE_PROVIDER_SITE_OTHER): Payer: Medicare Other | Admitting: Internal Medicine

## 2014-12-14 DIAGNOSIS — J449 Chronic obstructive pulmonary disease, unspecified: Secondary | ICD-10-CM

## 2014-12-14 LAB — PULMONARY FUNCTION TEST
DL/VA % pred: 87 %
DL/VA: 4.04 ml/min/mmHg/L
DLCO UNC % PRED: 47 %
DLCO UNC: 15.93 ml/min/mmHg
FEF 25-75 Post: 1.56 L/sec
FEF 25-75 Pre: 1.41 L/sec
FEF2575-%CHANGE-POST: 9 %
FEF2575-%PRED-POST: 75 %
FEF2575-%Pred-Pre: 68 %
FEV1-%CHANGE-POST: 1 %
FEV1-%PRED-POST: 59 %
FEV1-%Pred-Pre: 58 %
FEV1-Post: 1.76 L
FEV1-Pre: 1.75 L
FEV1FVC-%Change-Post: -3 %
FEV1FVC-%Pred-Pre: 107 %
FEV6-%Change-Post: 6 %
FEV6-%PRED-POST: 61 %
FEV6-%Pred-Pre: 57 %
FEV6-PRE: 2.25 L
FEV6-Post: 2.39 L
FEV6FVC-%CHANGE-POST: 1 %
FEV6FVC-%Pred-Post: 106 %
FEV6FVC-%Pred-Pre: 105 %
FVC-%Change-Post: 5 %
FVC-%Pred-Post: 57 %
FVC-%Pred-Pre: 54 %
FVC-Post: 2.39 L
FVC-Pre: 2.28 L
POST FEV1/FVC RATIO: 74 %
POST FEV6/FVC RATIO: 100 %
PRE FEV6/FVC RATIO: 99 %
Pre FEV1/FVC ratio: 77 %

## 2014-12-14 NOTE — Progress Notes (Signed)
PFT done today. 

## 2014-12-19 ENCOUNTER — Telehealth: Payer: Self-pay | Admitting: Internal Medicine

## 2014-12-19 DIAGNOSIS — J449 Chronic obstructive pulmonary disease, unspecified: Secondary | ICD-10-CM

## 2014-12-19 NOTE — Telephone Encounter (Signed)
Moderately severe Obstructive Airways Disease Insignificant response to bronchodilator Severe Restriction -Interstitial Severe Diffusion Defect

## 2014-12-19 NOTE — Telephone Encounter (Signed)
PFTs in 2012 show mild to moderate disease, now moderately severe obstruction. Please arrange a referral to pulmonology. COPD,

## 2014-12-19 NOTE — Telephone Encounter (Signed)
Spoke with Pt, informed him of PFT results and Dr. Ethel Rana recommendation of seeing Pulmonary non-urgent. Pt verbalized understanding of results and agreed to pulmonary referral.

## 2014-12-19 NOTE — Telephone Encounter (Signed)
Pulmonology referral placed.

## 2014-12-23 ENCOUNTER — Other Ambulatory Visit: Payer: Self-pay | Admitting: Internal Medicine

## 2015-01-05 ENCOUNTER — Ambulatory Visit (INDEPENDENT_AMBULATORY_CARE_PROVIDER_SITE_OTHER): Payer: Medicare Other | Admitting: Endocrinology

## 2015-01-05 ENCOUNTER — Encounter: Payer: Self-pay | Admitting: Endocrinology

## 2015-01-05 VITALS — BP 124/68 | HR 73 | Temp 97.7°F | Resp 14 | Wt 193.0 lb

## 2015-01-05 DIAGNOSIS — E291 Testicular hypofunction: Secondary | ICD-10-CM

## 2015-01-05 DIAGNOSIS — Z125 Encounter for screening for malignant neoplasm of prostate: Secondary | ICD-10-CM

## 2015-01-05 LAB — PSA, MEDICARE: PSA: 0.46 ng/mL (ref 0.10–4.00)

## 2015-01-05 NOTE — Progress Notes (Signed)
Subjective:    Patient ID: Joshua Bullock, male    DOB: 07/19/1932, 81 y.o.   MRN: [id number]  HPI Pt returns for f/u of idiopathic central hypogonadism (dx'ed 2013; pituitary MRI was normal; he has 1 biological child (now age 26), who was born after a neg w/u of infertility; he had never been rx'ed for hypogonadism prior to eval here; he has never taken illicit androgens; testosterone level did not respond to clomid, so he was changed to androgel).  pt states he feels better in general, and seldom misses the androgel.  He says sleep apnea is well-controlled.   Past Medical History  Diagnosis Date  . Hyperlipidemia   . Arthritis   . COPD (chronic obstructive pulmonary disease) ([place]) 05/09/2011    Former smoker, fine crackles at bases on exam. PFTs 2012 documented mild-moderate dz    . Hypogonadism male 09/01/2012  . DECREASED HEARING 11/05/2009    Qualifier: Diagnosis of  By: Marijean Niemann CMA, [place]    . OSTEOPENIA 11/15/2007    Per DEXA 2008    . HYPERGLYCEMIA 05/17/2008    Qualifier: Diagnosis of  By: Larose Kells MD, [place]     Past Surgical History  Procedure Laterality Date  . Tonsillectomy  1955  . Eye surgery Bilateral 2016    Cataract    Social History   Social History  . Marital Status: Married    Spouse Name: N/A  . Number of Children: 1  . Years of Education: N/A   Occupational History  . retired     Social History Main Topics  . Smoking status: Former Research scientist (life sciences)  . Smokeless tobacco: Never Used     Comment: quit 2009  . Alcohol Use: 0.0 oz/week    0 Standard drinks or equivalent per week     Comment: 1-2 a month wine or beer  . Drug Use: No  . Sexual Activity: Not on file   Other Topics Concern  . Not on file   Social History Narrative   Retired from [employer] 2015    Current Outpatient Prescriptions on File Prior to Visit  Medication Sig Dispense Refill  . amLODipine (NORVASC) 5 MG tablet Take 1 tablet (5 mg total) by mouth daily. 30 tablet 11  . apixaban  (ELIQUIS) 5 MG TABS tablet Take 1 tablet (5 mg total) by mouth 2 (two) times daily. 60 tablet 6  . aspirin EC 81 MG tablet Take 81 mg by mouth daily.    Marland Kitchen azelastine (ASTELIN) 0.1 % nasal spray Place 2 sprays into both nostrils 2 (two) times daily. 30 mL 5  . CALCIUM-VITAMIN D PO Take 1 each by mouth daily.      . citalopram (CELEXA) 20 MG tablet Take 10 mg by mouth daily.    Marland Kitchen diltiazem (CARDIZEM) 30 MG tablet Take 1 tablet every 4-6 hours AS NEEDED for fast heart rate. 30 tablet 2  . hydrocortisone cream 1 % Apply 1 application topically 2 (two) times daily.    . Multiple Vitamins-Minerals (MULTIVITAMIN,TX-MINERALS) tablet Take 1 tablet by mouth daily.      . Multiple Vitamins-Minerals (PRESERVISION AREDS 2) CAPS Take 2 capsules by mouth 2 (two) times daily.    Marland Kitchen omeprazole (PRILOSEC OTC) 20 MG tablet Take 20 mg by mouth daily.    . pravastatin (PRAVACHOL) 40 MG tablet Take 1 tablet (40 mg total) by mouth daily. 90 tablet 1  . Testosterone (ANDROGEL PUMP) 20.25 MG/ACT (1.62%) GEL USE 5 PUMPS ON THE SKIN EVERY  DAY 225 g 5   No current facility-administered medications on file prior to visit.    Allergies  Allergen Reactions  . Influenza Virus Vacc Split Pf Other (See Comments)    Hospitalized with temperatures above 103 and classic symptoms of flu  . Tetanus Toxoid Hives    Family History  Problem Relation Age of Onset  . Prostate cancer Neg Hx   . Colon cancer Neg Hx   . Hypertension Father   . Arthritis Sister   . Breast cancer Mother   . Hypertension Sister   . CAD Neg Hx     BP 124/68 mmHg  Pulse 73  Temp(Src) 97.7 F (36.5 C) (Oral)  Resp 14  Wt 193 lb (87.544 kg)  SpO2 94%    Review of Systems Denies decreased urinary stream.      Objective:   Physical Exam VITAL SIGNS:  See vs page GENERAL: no distress GENITALIA: Normal scrotum and penis.  Testicles are small and soft.   Ext: no edema.    Lab Results  Component Value Date   WBC 9.8 11/29/2014   HGB  13.8 11/29/2014   HCT 40.8 11/29/2014   MCV 92.9 11/29/2014   PLT 243.0 11/29/2014   Lab Results  Component Value Date   TESTOSTERONE 214* 01/05/2015   Lab Results  Component Value Date   PSA 0.46 01/05/2015   PSA 0.78 01/04/2014   PSA 0.05* 09/01/2012      Assessment & Plan:  Hypogonadism: the low testosterone level must be weighed against his dosage and side-effects.    Patient is advised the following: Patient Instructions  blood tests are requested for you today.  We'll let you know about the results. Please come back for a follow-up appointment in 6 months.   normalization of testosterone is not known to harm you.  however, there are "theoretical" risks, including increased fertility, hair loss, prostate cancer, benign prostate enlargement, blood clots, liver problems, lower hdl ("good cholesterol"), polycythemia (opposite of anemia), sleep apnea, and behavior changes   addendum: Please continue the same medication.

## 2015-01-05 NOTE — Patient Instructions (Addendum)
blood tests are requested for you today.  We'll let you know about the results. Please come back for a follow-up appointment in 6 months.   normalization of testosterone is not known to harm you.  however, there are "theoretical" risks, including increased fertility, hair loss, prostate cancer, benign prostate enlargement, blood clots, liver problems, lower hdl ("good cholesterol"), polycythemia (opposite of anemia), sleep apnea, and behavior changes

## 2015-01-07 LAB — TESTOSTERONE,FREE AND TOTAL
Testosterone, Free: 1.6 pg/mL — ABNORMAL LOW (ref 6.6–18.1)
Testosterone: 214 ng/dL — ABNORMAL LOW (ref 348–1197)

## 2015-01-08 ENCOUNTER — Ambulatory Visit (INDEPENDENT_AMBULATORY_CARE_PROVIDER_SITE_OTHER): Payer: Medicare Other | Admitting: Pulmonary Disease

## 2015-01-08 ENCOUNTER — Encounter: Payer: Self-pay | Admitting: Pulmonary Disease

## 2015-01-08 ENCOUNTER — Other Ambulatory Visit (INDEPENDENT_AMBULATORY_CARE_PROVIDER_SITE_OTHER): Payer: Medicare Other

## 2015-01-08 VITALS — BP 120/66 | HR 73 | Temp 98.4°F | Ht 70.5 in | Wt 194.8 lb

## 2015-01-08 DIAGNOSIS — J984 Other disorders of lung: Secondary | ICD-10-CM | POA: Diagnosis not present

## 2015-01-08 DIAGNOSIS — I519 Heart disease, unspecified: Secondary | ICD-10-CM | POA: Diagnosis not present

## 2015-01-08 DIAGNOSIS — I5189 Other ill-defined heart diseases: Secondary | ICD-10-CM

## 2015-01-08 DIAGNOSIS — J8410 Pulmonary fibrosis, unspecified: Secondary | ICD-10-CM

## 2015-01-08 DIAGNOSIS — I480 Paroxysmal atrial fibrillation: Secondary | ICD-10-CM | POA: Diagnosis not present

## 2015-01-08 LAB — SEDIMENTATION RATE: Sed Rate: 17 mm/hr (ref 0–22)

## 2015-01-08 LAB — RHEUMATOID FACTOR: Rhuematoid fact SerPl-aCnc: <10 IU/mL (ref <=14)

## 2015-01-08 LAB — C-REACTIVE PROTEIN: CRP: 0.6 mg/dL (ref 0.5–20.0)

## 2015-01-08 MED ORDER — FLUTICASONE-SALMETEROL 250-50 MCG/DOSE IN AEPB: 1.0000 | INHALATION_SPRAY | Freq: Two times a day (BID) | RESPIRATORY_TRACT | Status: AC

## 2015-01-08 NOTE — Progress Notes (Addendum)
Subjective:     Patient ID: Joshua Bullock, male   DOB: June 23, 1932, 81 y.o.   MRN: [id number]  HPI ~  January 08, 2015:  Initial pulmonary consult w/ SN>      15 y/o WM, pt of DrPaz, referred for a pulmonary evaluation due to dyspnea;  He reports a hx of SOB/DOE x several yrs noticed mostly w/ activity eg- walking, stairs, etc; states he walks 52mi ~3d/wk and gets winded, this started several yrs ago & persists to the present but does NOT appear to be progressive by his hx; he has no trouble w/ ADLs etc; he also notes sl cough, min sput from post nasal drip he thinks & he notes mult allergies w/ prev testing in the service & by DrESL- took shots for about 15 yrs- last in 1987...  He had a Sleep study by [facility] 09/06/14 showing AHI=3.3/hr & RDI=5.2/hr (c/w UARS) but AHI during REM was 11.3/hr c/w mild OSA during REM; O2sat was 91% at baseline & dropped to 84% during REM sleep; cardiac was NSR, no periodic limb movements; he did not meet the criteria for CPAP use, advised to not sleep supine where most events occurred...   Smoking Hx>  He is an ex-smoker, starting around age 81, smoked for 90 yrs up to 1ppd, quit about 8 yrs ago at age 27 w/ the help of Chantix...  Pulmonary Hx>  He denies any signif PMHx of lung disease- states he had a viral pneumonia at age 29, but no prob since then, denies repeated bronchitic infections, no hx asthma, never been aware of COPD/emphysema, and denies hx Tb or known exposure...   Medical Hx>  Hx AFib, Diastolic CHF, HL, Low-T, DJD/ osteopenia, anxiety...   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   Current Meds>  ASA81, Amlod5, Cardizem30 prn, Eliquis5Bid, Prav40, Omep20, Androgel, Celexa20  EXAM reveals AFeb, VSS, O2sat=92% on RA at rest;  HEENT- neg,  mallampati1;  Chest- diffuse velcro rales 1/2 up posteriorly and heard anteriorly as well;  Heart- RR, gr1/6 SEM w/o r/g;  Abd- soft, neg;  Ext- w/o c/c/e;  Neuro- ?movement disorder & quite restless during the interview...   Baseline CXR 10/2009 showed norm heart size, mild bronchitic changes, NAD     Last CXR 06/23/14 showed borderline heart size, increased interstitial markings diffusely & atx in LLL  Myoview 06/28/14 showed low risk nuclear study w/ sm area of attenuation inferiorly, norm LVF w/ EF=57%, normal wall motion, etc...  2DEcho 06/2014 showed norm LV size & function w/ EF=60-65%, no regional wall motion abn, severe focal basal septal hypertrophyGr 2 DD, mildly thickened AoV & MV leaflets w/ mild MR; RV- poorly vis...  Full PFTs done 12/14/14 showed FVC=2.28 (54%), FEV1=1.75 (58%), %1sec=77, mid-flows sl reduced at 68% predicted; after the bronchodil the FEV1 was unchanged;  TLC=4.48 (61%), RV=2.18 (79%), RV/TLC=49;  DLCO=47% predicted; these PFTs show a moderate restricted ventilatory defect w/ superimposed small airways disease; Diffusion is significantly reduced...   Routine LABS 9-12/2014:  Chems- wnl;  CBC- wnl w/ Hg=13.8;  Low-T noted...   Ambulatory O2sat test 01/08/15> O2sat=95% on RA at rest w/ pulse=61/min; pt ambulated 3 laps w/ lowest O2sat=89% w/ pulse=86/min  Additional LABS & collagen-vasc screen 10/16> Sed=17,  CRP=0.6 (norm<20),  ACE=29,  RheumFactor<10,  ANA=neg,  ANCA=neg (MPO & PR-3)...  Hi-resolution CT Chest> Done 01/16/15:  Norm heart size, atherosclerotic changes, mildly enlarged mediastinal LNs (1.2-1.5cm size), no pulm nodules or emphysema, +ILD w/ patchy subpleural reticulation bilat w/ assoc traction bronchiectassis & scatered regions of honeycobing (anterior upper lobes and posterior lower lobes) w/ air trapping on expiration, min areas of GG opac noted; this is felt to be c/w UIP pathology; note: granulomatous calcif in liver & spleen, +gallstone, +DJD  Tspine...  IMP/PLANS>>     Dyspnea due to underlying lung dis> we will start him on [id number] for the time being as we assess the need for Pred etc...    Pulmonary fibrosis, interstitial lung disease & restrictive physiology in an 81 y/o gentleman> he has fine velcro rales ~1/2 way up the back & restrictive physiology on PFTs; we will proceed w/ LABS- collagen vasc screen and Hi-res CTChest.     Ex-smoker w/ ~50 pack-yr smoking hx, quit 8 yrs ago at age 39>  He has smoked enough to cause COPD, but PFT looks mostly restrictive- awaiting CT images...    Mild OSA w/ AHI=11.3 in REM    Hx allergies w/ mult skin test reactions in the past    Cardiac> Hx PAF & Gr 2 DD    Medical> HL, IFG, Low-T, DJD/osteopenia, anxiety   ADDENDUM>>  I called pt & wife 01/18/15 to discuss CT & labs>  Looks like IPF/ UIP and we discussed the nature of this disease & poss treatments w/ supportive care, antireflux regimen, avoiding exac, and consideration of anti-fibrotic therapy;  I have recommended a consult w/ DrRamaswamy to consider this treatment & to see if he feels lung bx is needed or not...     Past Medical History  Diagnosis Date  . Hyperlipidemia   . Arthritis   . COPD (chronic obstructive pulmonary disease) ([place]) 05/09/2011    Former smoker, fine crackles at bases on exam. PFTs 2012 documented mild-moderate dz    . Hypogonadism male 09/01/2012  . DECREASED HEARING 11/05/2009    Qualifier: Diagnosis of  By: Marijean Niemann CMA, Danielle    . OSTEOPENIA 11/15/2007    Per DEXA 2008    . HYPERGLYCEMIA 05/17/2008    Qualifier: Diagnosis of  By: Larose Kells MD, Alda Berthold Sleep apnea     Past Surgical History  Procedure Laterality Date  . Tonsillectomy  1955  . Eye surgery Bilateral 2016    Cataract  . Rotator cuff repair      right    Outpatient Encounter Prescriptions as of 01/08/2015  Medication Sig  . amLODipine (NORVASC) 5 MG tablet Take 1 tablet (5 mg total) by mouth daily.  Marland Kitchen apixaban (ELIQUIS) 5 MG TABS  tablet Take 1 tablet (5 mg total) by mouth 2 (two) times daily.  Marland Kitchen aspirin EC 81 MG tablet Take 81 mg by mouth daily.  Marland Kitchen azelastine (ASTELIN) 0.1 % nasal spray Place 2 sprays into both nostrils 2 (two) times daily.  Marland Kitchen CALCIUM-VITAMIN D PO Take 1 each by mouth daily.    . citalopram (CELEXA) 20 MG tablet Take 10 mg by mouth daily.  Marland Kitchen diltiazem (CARDIZEM) 30 MG tablet Take 1 tablet every 4-6 hours AS NEEDED for fast heart rate.  . fluticasone (FLONASE) 50 MCG/ACT nasal spray Place 1 spray into both nostrils daily.  . hydrocortisone cream 1 % Apply 1 application topically 2 (two) times daily.  . Multiple Vitamins-Minerals (MULTIVITAMIN,TX-MINERALS) tablet Take 1 tablet by  mouth daily.    . Multiple Vitamins-Minerals (PRESERVISION AREDS 2) CAPS Take 2 capsules by mouth 2 (two) times daily.  Marland Kitchen omeprazole (PRILOSEC OTC) 20 MG tablet Take 20 mg by mouth daily.  . pravastatin (PRAVACHOL) 40 MG tablet Take 1 tablet (40 mg total) by mouth daily.  . Testosterone (ANDROGEL PUMP) 20.25 MG/ACT (1.62%) GEL USE 5 PUMPS ON THE SKIN EVERY DAY    Allergies  Allergen Reactions  . Influenza Virus Vacc Split Pf Other (See Comments)    Hospitalized with temperatures above 103 and classic symptoms of flu  . Tetanus Toxoid Hives    Family History  Problem Relation Age of Onset  . Prostate cancer Neg Hx   . Colon cancer Neg Hx   . Hypertension Father   . Arthritis Sister   . Breast cancer Mother   . Hypertension Sister   . CAD Neg Hx     Social History   Social History  . Marital Status: Married    Spouse Name: N/A  . Number of Children: 1  . Years of Education: N/A   Occupational History  . retired  [employer]   Social History Main Topics  . Smoking status: Former Smoker -- 0.75 packs/day for 51 years    Types: Cigarettes    Quit date: 03/24/1970  . Smokeless tobacco: Never Used     Comment: quit 2009  . Alcohol Use: 0.0 oz/week    0 Standard drinks or equivalent per week     Comment: 1-2  a month wine or beer  . Drug Use: No  . Sexual Activity: Not on file   Other Topics Concern  . Not on file   Social History Narrative   Retired from [employer] 2015    Current Medications, Allergies, Past Medical History, Past Surgical History, Family History, and Social History were reviewed in [employer] record.   Review of Systems             All symptoms NEG except where BOLDED >>  Constitutional:  F/C/S, fatigue, anorexia, unexpected weight change. HEENT:  HA, visual changes, hearing loss, earache, nasal symptoms, sore throat, mouth sores, hoarseness. Resp:  cough, sputum, hemoptysis; SOB, tightness, wheezing. Cardio:  CP, palpit, DOE, orthopnea, edema. GI:  N/V/D/C, blood in stool; reflux, abd pain, distention, gas. GU:  dysuria, freq, urgency, hematuria, flank pain, voiding difficulty. MS:  joint pain, swelling, tenderness, decr ROM; neck pain, back pain, etc. Neuro:  HA, tremors, seizures, dizziness, syncope, weakness, numbness, gait abn. Skin:  suspicious lesions or skin rash. Heme:  adenopathy, bruising, bleeding. Psyche:  confusion, agitation, sleep disturbance, hallucinations, anxiety, depression suicidal.   Objective:   Physical Exam       Vital Signs:  Reviewed...  General:  WD, WN, 81 y/o WM in NAD; alert & oriented; pleasant & cooperative... HEENT:  [place]/AT; Conjunctiva- pink, Sclera- nonicteric, EOM-wnl, PERRLA, EACs-clear, TMs-wnl; NOSE-clear; THROAT-clear & wnl. Neck:  Supple w/ fair ROM; no JVD; normal carotid impulses w/o bruits; no thyromegaly or nodules palpated; no lymphadenopathy. Chest:  Fine velcro rales 1/2 way up the back & anteriorly as well; no wheezing, rhonchi, or sounds of consolidation... Heart:  Regular Rhythm; norm S1 & S2 Gr1/6 SEM w/o rubs or gallops detected. Abdomen:  Soft & nontender- no guarding or rebound; normal bowel sounds; no organomegaly or masses palpated. Ext:  Normal ROM; without deformities or arthritic  changes; no varicose veins, venous insuffic, or edema;  Pulses intact w/o bruits. Neuro:  CNs II-XII intact; motor testing normal; sensory testing normal; gait normal & balance OK. Derm:  No lesions noted; no rash etc. Lymph:  No cervical, supraclavicular, axillary, or inguinal adenopathy palpated.   Assessment:      IMP/PLANS>>     Dyspnea due to underlying lung dis> we will start him on ADVAIR250Bid for the time being as we assess the need for Pred etc...    Pulmonary fibrosis, interstitial lung disease & restrictive physiology in an 80 y/o gentleman> he has fine velcro rales ~1/2 way up the back & restrictive physiology on PFTs; we will proceed w/ LABS- collagen vasc screen and Hi-res CTChest.     Ex-smoker w/ ~50 pack-yr smoking hx, quit 8 yrs ago at age 63>  He has smoked enough to cause COPD, but PFT looks mostly restrictive- awaiting CT images...    Mild OSA w/ AHI=11.3 in REM    Hx allergies w/ mult skin test reactions in the past    Cardiac> Hx PAF & Gr 2 DD    Medical> HL, IFG, Low-T, DJD/osteopenia, anxiety      Plan:     Patient's Medications  New Prescriptions   FLUTICASONE-SALMETEROL (ADVAIR) 250-50 MCG/DOSE AEPB    Inhale 1 puff into the lungs every 12 (twelve) hours.  Previous Medications   AMLODIPINE (NORVASC) 5 MG TABLET    Take 1 tablet (5 mg total) by mouth daily.   APIXABAN (ELIQUIS) 5 MG TABS TABLET    Take 1 tablet (5 mg total) by mouth 2 (two) times daily.   ASPIRIN EC 81 MG TABLET    Take 81 mg by mouth daily.   AZELASTINE (ASTELIN) 0.1 % NASAL SPRAY    Place 2 sprays into both nostrils 2 (two) times daily.   CALCIUM-VITAMIN D PO    Take 1 each by mouth daily.     CITALOPRAM (CELEXA) 20 MG TABLET    Take 10 mg by mouth daily.   DILTIAZEM (CARDIZEM) 30 MG TABLET    Take 1 tablet every 4-6 hours AS NEEDED for fast heart rate.   FLUTICASONE (FLONASE) 50 MCG/ACT NASAL SPRAY    Place 1 spray into both nostrils daily.   HYDROCORTISONE CREAM 1 %    Apply 1  application topically 2 (two) times daily.   MULTIPLE VITAMINS-MINERALS (MULTIVITAMIN,TX-MINERALS) TABLET    Take 1 tablet by mouth daily.     MULTIPLE VITAMINS-MINERALS (PRESERVISION AREDS 2) CAPS    Take 2 capsules by mouth 2 (two) times daily.   OMEPRAZOLE (PRILOSEC OTC) 20 MG TABLET    Take 20 mg by mouth daily.   PRAVASTATIN (PRAVACHOL) 40 MG TABLET    Take 1 tablet (40 mg total) by mouth daily.   TESTOSTERONE (ANDROGEL PUMP) 20.25 MG/ACT (1.62%) GEL    USE 5 PUMPS ON THE SKIN EVERY DAY  Modified Medications   No medications on file  Discontinued Medications   No medications on file

## 2015-01-08 NOTE — Patient Instructions (Signed)
Joshua Bullock-- it was nice meeting you today...  We briefly discussed your pulmonary fibrosis and outlined some further evaluation and treatment plan >>    Today we checked some blood work to rule out any underlying systemic illness...    We will arrange for a Hi-resolution CT of the chest to evaluate the extent of the scar tissue...  We decided to start an inhaler> [id number]- one inhalation twice daily on a regular basis...  Stay as active as possible...  Call for any questions...  Let's plan a follow up visit in 66mo, sooner if needed for problems.Marland KitchenMarland Kitchen

## 2015-01-09 LAB — PAN-ANCA
ANCA SCREEN: NEGATIVE
Serine Protease 3: <1

## 2015-01-09 LAB — ANGIOTENSIN CONVERTING ENZYME: ANGIOTENSIN-CONVERTING ENZYME: 29 U/L (ref 8–52)

## 2015-01-09 LAB — ANA: Anti Nuclear Antibody(ANA): NEGATIVE

## 2015-01-15 ENCOUNTER — Ambulatory Visit (INDEPENDENT_AMBULATORY_CARE_PROVIDER_SITE_OTHER)
Admission: RE | Admit: 2015-01-15 | Discharge: 2015-01-15 | Disposition: A | Discharge status: Home / Self Care | Payer: Medicare Other | Source: Ambulatory Visit | Attending: Pulmonary Disease | Admitting: Pulmonary Disease

## 2015-01-15 DIAGNOSIS — J8410 Pulmonary fibrosis, unspecified: Secondary | ICD-10-CM | POA: Diagnosis not present

## 2015-01-24 ENCOUNTER — Encounter: Payer: Self-pay | Admitting: Internal Medicine

## 2015-01-24 ENCOUNTER — Ambulatory Visit (INDEPENDENT_AMBULATORY_CARE_PROVIDER_SITE_OTHER): Payer: Medicare Other | Admitting: Internal Medicine

## 2015-01-24 VITALS — BP 116/58 | HR 59 | Ht 71.0 in | Wt 190.5 lb

## 2015-01-24 DIAGNOSIS — I519 Heart disease, unspecified: Secondary | ICD-10-CM

## 2015-01-24 DIAGNOSIS — I4891 Unspecified atrial fibrillation: Secondary | ICD-10-CM

## 2015-01-24 DIAGNOSIS — I480 Paroxysmal atrial fibrillation: Secondary | ICD-10-CM | POA: Diagnosis not present

## 2015-01-24 DIAGNOSIS — E785 Hyperlipidemia, unspecified: Secondary | ICD-10-CM | POA: Diagnosis not present

## 2015-01-24 DIAGNOSIS — I5189 Other ill-defined heart diseases: Secondary | ICD-10-CM

## 2015-01-24 MED ORDER — APIXABAN 5 MG PO TABS: 5.0000 mg | ORAL_TABLET | Freq: Two times a day (BID) | ORAL | Status: AC

## 2015-01-24 NOTE — Patient Instructions (Signed)
Medication Instructions:  Your physician recommends that you continue on your current medications as directed. Please refer to the Current Medication list given to you today.   Labwork: none  Testing/Procedures: none  Follow-Up: Your physician wants you to follow-up in: 12 months with Dr. Debara Pickett.. You will receive a reminder letter in the mail two months in advance. If you don't receive a letter, please call our office to schedule the follow-up appointment.   Any Other Special Instructions Will Be Listed Below (If Applicable).     If you need a refill on your cardiac medications before your next appointment, please call your pharmacy.

## 2015-01-24 NOTE — Progress Notes (Signed)
OFFICE NOTE  Chief Complaint:  Routine follow-up  Primary Care Physician: [name], MD  HPI:  Joshua Bullock is an 81 year old male with hx COPD and pulmonary fibrosis, HLD, and hypogonadism admitted 06/23/14 with weakness and diaphoresis for 2 days. Denies any h/o AF or known heart disease. Was feeling fine on Wednesday prior to admission, active, able to mow the lawn. woke up Thursday at 5am with palpitations and feeling weak. This persisted all day. On 06/23/14 he continued to feel weak. Went to PCP and found to be in AF with RVR. Sent to ER. In ER, ECG with AF with v-rate of 145. No ST-T changes. Troponin 0.08. Denied CP or HF symptoms. Started on diltiazem gtt and dropped rate down to about 100. Then given 50 mg of po lopressor and HR dropped into 40s transiently and SBP into 70s. Diltiazem gtt stopped. Pt admitted to step down. CHA2DS2VASc =2 and he was started on Eliquis. He was given one dose of Flecainide in the ED. He was hypotensive and required Neosynephrine for a few hours. He converted to sinus bradycardia. Echo done 06/24/14 revealed an EF of 60-65% with grade 2 diastolic dysfunction.  [name] overall is doing well. On follow-up he was seen in the A. fib clinic by Roderic Palau, NP and his aspirin was stopped. He underwent a nuclear stress test which was negative for ischemia. He is scheduled for sleep study because there is significant concern for sleep apnea and hopefully treatment will reduce his risk of recurrent A. fib. It should be noted he did have elevated troponin which is thought to be rate related, but suggested maybe underlying coronary disease although it is likely nonobstructive.  [name] returns today for follow-up. In the interim he underwent a sleep study which showed mild upper airway resistance but no evidence for apnea. He reports he is sleeping better now that he spends laying on his side. EKG today shows sinus rhythm with some sinus arrhythmia at  59. He denies any recurrent palpitations or episodes of A. fib. He's tolerating eloquence without any bleeding complications. He's also on aspirin due to elevated troponins in the thought that he might have coronary disease, although Myoview was negative. Additional therapy includes pravastatin which is provided by his primary care provider.  PMHx:  Past Medical History  Diagnosis Date  . Hyperlipidemia   . Arthritis   . COPD (chronic obstructive pulmonary disease) ([place]) 05/09/2011    Former smoker, fine crackles at bases on exam. PFTs 2012 documented mild-moderate dz    . Hypogonadism male 09/01/2012  . DECREASED HEARING 11/05/2009    Qualifier: Diagnosis of  By: Marijean Niemann CMA, Danielle    . OSTEOPENIA 11/15/2007    Per DEXA 2008    . HYPERGLYCEMIA 05/17/2008    Qualifier: Diagnosis of  By: Larose Kells MD, Alda Berthold Sleep apnea     Past Surgical History  Procedure Laterality Date  . Tonsillectomy  1955  . Eye surgery Bilateral 2016    Cataract  . Rotator cuff repair      right    FAMHx:  Family History  Problem Relation Age of Onset  . Prostate cancer Neg Hx   . Colon cancer Neg Hx   . Hypertension Father   . Arthritis Sister   . Breast cancer Mother   . Hypertension Sister   . CAD Neg Hx     SOCHx:   reports that he quit smoking about 44  years ago. His smoking use included Cigarettes. He has a 38.25 pack-year smoking history. He has never used smokeless tobacco. He reports that he drinks alcohol. He reports that he does not use illicit drugs.  ALLERGIES:  Allergies  Allergen Reactions  . Influenza Virus Vacc Split Pf Other (See Comments)    Hospitalized with temperatures above 103 and classic symptoms of flu  . Tetanus Toxoid Hives    ROS: A comprehensive review of systems was negative.  HOME MEDS: Current Outpatient Prescriptions  Medication Sig Dispense Refill  . amLODipine (NORVASC) 5 MG tablet Take 1 tablet (5 mg total) by mouth daily. 30 tablet 11  . apixaban  (ELIQUIS) 5 MG TABS tablet Take 1 tablet (5 mg total) by mouth 2 (two) times daily. 60 tablet 11  . aspirin EC 81 MG tablet Take 81 mg by mouth daily.    Marland Kitchen azelastine (ASTELIN) 0.1 % nasal spray Place 2 sprays into both nostrils 2 (two) times daily. 30 mL 5  . CALCIUM-VITAMIN D PO Take 1 each by mouth daily.      . citalopram (CELEXA) 20 MG tablet Take 10 mg by mouth daily.    Marland Kitchen diltiazem (CARDIZEM) 30 MG tablet Take 1 tablet every 4-6 hours AS NEEDED for fast heart rate. 30 tablet 2  . fluticasone (FLONASE) 50 MCG/ACT nasal spray Place 1 spray into both nostrils daily.    . Fluticasone-Salmeterol (ADVAIR) 250-50 MCG/DOSE AEPB Inhale 1 puff into the lungs every 12 (twelve) hours. 60 each 11  . hydrocortisone cream 1 % Apply 1 application topically 2 (two) times daily.    . Multiple Vitamins-Minerals (MULTIVITAMIN,TX-MINERALS) tablet Take 1 tablet by mouth daily.      . Multiple Vitamins-Minerals (PRESERVISION AREDS 2) CAPS Take 2 capsules by mouth 2 (two) times daily.    Marland Kitchen omeprazole (PRILOSEC OTC) 20 MG tablet Take 20 mg by mouth daily.    . pravastatin (PRAVACHOL) 40 MG tablet Take 1 tablet (40 mg total) by mouth daily. 90 tablet 1  . Testosterone (ANDROGEL PUMP) 20.25 MG/ACT (1.62%) GEL USE 5 PUMPS ON THE SKIN EVERY DAY 225 g 5   No current facility-administered medications for this visit.    LABS/IMAGING: No results found for this or any previous visit (from the past 48 hour(s)). No results found.  WEIGHTS: Wt Readings from Last 3 Encounters:  01/24/15 190 lb 8 oz (86.41 kg)  01/08/15 194 lb 12.8 oz (88.361 kg)  01/05/15 193 lb (87.544 kg)    VITALS: BP 116/58 mmHg  Pulse 59  Ht 5\' 11"  (1.803 m)  Wt 190 lb 8 oz (86.41 kg)  BMI 26.58 kg/m2  EXAM: General appearance: alert and no distress Neck: no carotid bruit, no JVD and thyroid not enlarged, symmetric, no tenderness/mass/nodules Lungs: clear to auscultation bilaterally Heart: regular rate and rhythm, S1, S2 normal, no  murmur, click, rub or gallop Abdomen: soft, non-tender; bowel sounds normal; no masses,  no organomegaly Extremities: extremities normal, atraumatic, no cyanosis or edema Pulses: 2+ and symmetric Skin: Skin color, texture, turgor normal. No rashes or lesions Neurologic: Grossly normal Psych: Pleasant  EKG: Sinus rhythm with marked sinus arrhythmia 59, incomplete right bundle branch block  ASSESSMENT: 1. Paroxysmal atrial fibrillation-CHADSVASC score of 2 on Eliquis 2. Elevated troponin-low risk Myoview, on aspirin 3. Mildly abnormal sleep study-CPAP not recommended 4. Dyslipidemia  PLAN: 1.   [name] has not had recurrence of atrial fibrillation and is being maintained on his current medications with Cardizem as needed  for breakthrough A. fib. He is taking Eliquis without any bleeding complications. Although stress test was low risk, he did have elevated troponin suggesting there may be underlying small vessel coronary disease. As Eliquis's not beneficial for the treatment of coronary disease, I would recommend he continue low-dose aspirin. Fortunately his sleep study was negative for obstructive sleep apnea and he's had some improvement in sleeping on his side. He should continue his current medications we'll plan to see him back annually or sooner as necessary.  Pixie Casino, MD, [facility] Attending Cardiologist [facility]  Nadean Corwin Darshana Curnutt 01/24/2015, 1:20 PM

## 2015-02-12 ENCOUNTER — Encounter: Payer: Self-pay | Admitting: Pulmonary Disease

## 2015-02-12 ENCOUNTER — Other Ambulatory Visit: Payer: Medicare Other

## 2015-02-12 ENCOUNTER — Ambulatory Visit (INDEPENDENT_AMBULATORY_CARE_PROVIDER_SITE_OTHER): Payer: Medicare Other | Admitting: Pulmonary Disease

## 2015-02-12 ENCOUNTER — Ambulatory Visit (INDEPENDENT_AMBULATORY_CARE_PROVIDER_SITE_OTHER)
Admission: RE | Admit: 2015-02-12 | Discharge: 2015-02-12 | Disposition: A | Discharge status: Home / Self Care | Payer: Medicare Other | Source: Ambulatory Visit | Attending: Pulmonary Disease | Admitting: Pulmonary Disease

## 2015-02-12 VITALS — BP 122/60 | HR 71 | Temp 97.8°F | Wt 192.0 lb

## 2015-02-12 DIAGNOSIS — I480 Paroxysmal atrial fibrillation: Secondary | ICD-10-CM | POA: Diagnosis not present

## 2015-02-12 DIAGNOSIS — I519 Heart disease, unspecified: Secondary | ICD-10-CM | POA: Diagnosis not present

## 2015-02-12 DIAGNOSIS — J984 Other disorders of lung: Secondary | ICD-10-CM

## 2015-02-12 DIAGNOSIS — E871 Hypo-osmolality and hyponatremia: Secondary | ICD-10-CM

## 2015-02-12 DIAGNOSIS — I5189 Other ill-defined heart diseases: Secondary | ICD-10-CM

## 2015-02-12 DIAGNOSIS — J8410 Pulmonary fibrosis, unspecified: Secondary | ICD-10-CM

## 2015-02-12 LAB — BASIC METABOLIC PANEL
BUN: 10 mg/dL (ref 6–23)
CO2: 29 mEq/L (ref 19–32)
Calcium: 9.8 mg/dL (ref 8.4–10.5)
Chloride: 94 mEq/L — ABNORMAL LOW (ref 96–112)
Creatinine, Ser: 1.04 mg/dL (ref 0.40–1.50)
GFR: 72.92 mL/min (ref >60.00)
Glucose, Bld: 90 mg/dL (ref 70–99)
Potassium: 4.8 mEq/L (ref 3.5–5.1)
Sodium: 131 mEq/L — ABNORMAL LOW (ref 135–145)

## 2015-02-12 NOTE — Patient Instructions (Signed)
Today we updated your med list in our EPIC system...    Continue your current medications the same...  Today we rechecked your CXR & finished the collagen vascular screening labs...    We will contact you w/ the results when available...   You have an appt w/ DrRamaswamy on 12/16 to discuss poss antifibrotic treatment of your pulmonary fibrosis...  Call for any questions.Marland KitchenMarland Kitchen

## 2015-02-12 NOTE — Progress Notes (Signed)
Subjective:     Patient ID: Joshua Bullock, male   DOB: Dec 06, 1932, 81 y.o.   MRN: [id number]  HPI  ~  January 08, 2015:  Initial pulmonary consult w/ SN>      7 y/o WM, pt of DrPaz, referred for a pulmonary evaluation due to dyspnea;  He reports a hx of SOB/DOE x several yrs noticed mostly w/ activity eg- walking, stairs, etc; states he walks 30mi ~3d/wk and gets winded, this started several yrs ago & persists to the present but does NOT appear to be progressive by his hx; he has no trouble w/ ADLs etc; he also notes sl cough, min sput from post nasal drip he thinks & he notes mult allergies w/ prev testing in the service & by DrESL- took shots for about 15 yrs- last in 1987...  He had a Sleep study by [facility] 09/06/14 showing AHI=3.3/hr & RDI=5.2/hr (c/w UARS) but AHI during REM was 11.3/hr c/w mild OSA during REM; O2sat was 91% at baseline & dropped to 84% during REM sleep; cardiac was NSR, no periodic limb movements; he did not meet the criteria for CPAP use, advised to not sleep supine where most events occurred...   Smoking Hx>  He is an ex-smoker, starting around age 51, smoked for 54 yrs up to 1ppd, quit about 8 yrs ago at age 64 w/ the help of Chantix...  Pulmonary Hx>  He denies any signif PMHx of lung disease- states he had a viral pneumonia at age 51, but no prob since then, denies repeated bronchitic infections, no hx asthma, never been aware of COPD/emphysema, and denies hx Tb or known exposure...   Medical Hx>  Hx AFib, Diastolic CHF, HL, Low-T, DJD/ osteopenia, anxiety...   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   Current Meds>  ASA81, Amlod5, Cardizem30 prn, Eliquis5Bid, Prav40, Omep20, Androgel, Celexa20  EXAM reveals AFeb, VSS, O2sat=92% on RA at rest;  HEENT- neg,  mallampati1;  Chest- diffuse velcro rales 1/2 up posteriorly and heard anteriorly as well;  Heart- RR, gr1/6 SEM w/o r/g;  Abd- soft, neg;  Ext- w/o c/c/e;  Neuro- ?movement disorder & quite restless during the interview...   Baseline CXR 10/2009 showed norm heart size, mild bronchitic changes, NAD     Last CXR 06/23/14 showed borderline heart size, increased interstitial markings diffusely & atx in LLL  Myoview 06/28/14 showed low risk nuclear study w/ sm area of attenuation inferiorly, norm LVF w/ EF=57%, normal wall motion, etc...  2DEcho 06/2014 showed norm LV size & function w/ EF=60-65%, no regional wall motion abn, severe focal basal septal hypertrophyGr 2 DD, mildly thickened AoV & MV leaflets w/ mild MR; RV- poorly vis...  Full PFTs done 12/14/14 showed FVC=2.28 (54%), FEV1=1.75 (58%), %1sec=77, mid-flows sl reduced at 68% predicted; after the bronchodil the FEV1 was unchanged;  TLC=4.48 (61%), RV=2.18 (79%), RV/TLC=49;  DLCO=47% predicted; these PFTs show a moderate restricted ventilatory defect w/ superimposed small airways disease; Diffusion is significantly reduced...   Routine LABS 9-12/2014:  Chems- wnl;  CBC- wnl w/ Hg=13.8;  Low-T noted...   Ambulatory O2sat test 01/08/15> O2sat=95% on RA at rest w/ pulse=61/min; pt ambulated 3 laps w/ lowest O2sat=89% w/ pulse=86/min  Additional LABS & collagen-vasc screen 10/16> Sed=17,  CRP=0.6 (norm<20),  ACE=29,  RheumFactor<10,  ANA=neg,  ANCA=neg (MPO & PR-3)...  Hi-resolution CT Chest> Done 01/16/15:  Norm heart size, atherosclerotic changes, mildly enlarged mediastinal LNs (1.2-1.5cm size), no pulm nodules or emphysema, +ILD w/ patchy subpleural reticulation bilat w/ assoc traction bronchiectassis & scatered regions of honeycobing (anterior upper lobes and posterior lower lobes) w/ air trapping on expiration, min areas of GG opac noted; this is felt to be c/w UIP pathology; note: granulomatous calcif in liver & spleen, +gallstone, +DJD  Tspine...  IMP/PLANS>>     Dyspnea due to underlying lung dis> we will start him on ADVAIR250Bid for the time being as we assess the need for Pred etc...    Pulmonary fibrosis, interstitial lung disease & restrictive physiology in an 81 y/o gentleman> he has fine velcro rales ~1/2 way up the back & restrictive physiology on PFTs; we will proceed w/ LABS- collagen vasc screen and Hi-res CTChest.     Ex-smoker w/ ~50 pack-yr smoking hx, quit 8 yrs ago at age 59>  He has smoked enough to cause COPD, but PFT looks mostly restrictive- awaiting CT images...    Mild OSA w/ AHI=11.3 in REM    Hx allergies w/ mult skin test reactions in the past    Cardiac> Hx PAF & Gr 2 DD    Medical> HL, IFG, Low-T, DJD/osteopenia, anxiety   ADDENDUM>>  I called pt & wife 01/18/15 to discuss CT & labs>  Looks like IPF/ UIP and we discussed the nature of this disease & poss treatments w/ supportive care, antireflux regimen, avoiding exac, and consideration of anti-fibrotic therapy;  I have recommended a consult w/ DrRamaswamy to consider this treatment & to see if he feels lung bx is needed or not...    ~  February 12, 2015:  40mo ROV w/ SN>  "Joshua Bullock" feels that his breathing is sl better on the Advair250 & he can tell this by his singing voice & wind in the choir; he continues to walk 73mi 3d/wk- doing OK on level ground but having difficulty w/ hills/ stairs, denies CP/ palpit & hasn't needed NTG or Cardizem prn rxs...    Dyspnea> on [phone] & he feels that his breathing is sl better; notes DOE w/ hills & stairs, ok on level ground, denies cough/ sput/ hemoptysis/ CP/ etc...    Pulmonary fibrosis, interstitial lung disease & restrictive physiology in an 81 y/o gentleman> he has fine velcro rales ~1/2 way up the back & mod restriction; Hi-res CT is c/w UIP process and collagen vasc screen is essentially neg x +CPK at 479 (NEG- Sed/CRP, ACE, RF, ANA, ANCA, RNP, Scleroderma, HP panel)...     Ex-smoker w/ ~50 pack-yr smoking  hx, quit 8 yrs ago at age 59>  He has smoked enough to cause COPD, but PFT looks mostly restrictive & CT images w/o emphysema but +air trapping on expiration...    Mild OSA w/ AHI=11.3 in REM> sleep eval by [facility] 08/2014 did not meet the threshold for CPAP use...    Hx allergies w/ mult skin test reactions in the past> aware...    Cardiac> Hx PAF & Gr 2 DD...    Medical> HL, IFG, Low-T, DJD/osteopenia, anxiety... We reviewed prob list, meds, xrays and labs>> he is allergic to the Flu vaccine he says...  CXR 02/12/15 showed norm heart size, interstitial prominence ?sl worse than 06/2014 films, r/o active pneumonitis superimposed on chronic ILD...  LABS 01/2015 to complete collagen-vasc screen>  Hypersens Pneumonitis panel= NEG;  Scleroderma  Ab= neg;  RNP Ab= neg;  CPK sl elev at 479... IMP/PLAN>>  Joshua Bullock has an appt 03/09/15 w/ DrRamaswamy to consider poss anti-fibrotic therapy; continue current meds the same for now...     Past Medical History  Diagnosis Date  . Hyperlipidemia   . Arthritis   . COPD (chronic obstructive pulmonary disease) ([place]) 05/09/2011    Former smoker, fine crackles at bases on exam. PFTs 2012 documented mild-moderate dz    . Hypogonadism male 09/01/2012  . DECREASED HEARING 11/05/2009    Qualifier: Diagnosis of  By: Marijean Niemann CMA, Danielle    . OSTEOPENIA 11/15/2007    Per DEXA 2008    . HYPERGLYCEMIA 05/17/2008    Qualifier: Diagnosis of  By: Larose Kells MD, Alda Berthold Sleep apnea     Past Surgical History  Procedure Laterality Date  . Tonsillectomy  1955  . Eye surgery Bilateral 2016    Cataract  . Rotator cuff repair      right    Outpatient Encounter Prescriptions as of 02/12/2015  Medication Sig  . amLODipine (NORVASC) 5 MG tablet Take 1 tablet (5 mg total) by mouth daily.  Marland Kitchen apixaban (ELIQUIS) 5 MG TABS tablet Take 1 tablet (5 mg total) by mouth 2 (two) times daily.  Marland Kitchen aspirin EC 81 MG tablet Take 81 mg by mouth daily.  Marland Kitchen azelastine (ASTELIN) 0.1 % nasal spray  Place 2 sprays into both nostrils 2 (two) times daily.  Marland Kitchen CALCIUM-VITAMIN D PO Take 1 each by mouth daily.    . citalopram (CELEXA) 20 MG tablet Take 10 mg by mouth daily.  Marland Kitchen diltiazem (CARDIZEM) 30 MG tablet Take 1 tablet every 4-6 hours AS NEEDED for fast heart rate.  . fluticasone (FLONASE) 50 MCG/ACT nasal spray Place 1 spray into both nostrils daily.  . Fluticasone-Salmeterol (ADVAIR) 250-50 MCG/DOSE AEPB Inhale 1 puff into the lungs every 12 (twelve) hours.  . hydrocortisone cream 1 % Apply 1 application topically 2 (two) times daily.  . Multiple Vitamins-Minerals (MULTIVITAMIN,TX-MINERALS) tablet Take 1 tablet by mouth daily.    . Multiple Vitamins-Minerals (PRESERVISION AREDS 2) CAPS Take 2 capsules by mouth 2 (two) times daily.  Marland Kitchen omeprazole (PRILOSEC OTC) 20 MG tablet Take 20 mg by mouth daily.  . pravastatin (PRAVACHOL) 40 MG tablet Take 1 tablet (40 mg total) by mouth daily.  . Testosterone (ANDROGEL PUMP) 20.25 MG/ACT (1.62%) GEL USE 5 PUMPS ON THE SKIN EVERY DAY    Allergies  Allergen Reactions  . Influenza Virus Vacc Split Pf Other (See Comments)    Hospitalized with temperatures above 103 and classic symptoms of flu  . Tetanus Toxoid Hives    Family History  Problem Relation Age of Onset  . Prostate cancer Neg Hx   . Colon cancer Neg Hx   . Hypertension Father   . Arthritis Sister   . Breast cancer Mother   . Hypertension Sister   . CAD Neg Hx     Social History   Social History  . Marital Status: Married    Spouse Name: N/A  . Number of Children: 1  . Years of Education: N/A   Occupational History  . retired  [employer]   Social History Main Topics  . Smoking status: Former Smoker -- 0.75 packs/day for 51 years    Types: Cigarettes    Quit date: 03/24/1970  . Smokeless tobacco: Never Used     Comment: quit 2009  . Alcohol Use: 0.0 oz/week  0 Standard drinks or equivalent per week     Comment: 1-2 a month wine or beer  . Drug Use: No  . Sexual  Activity: Not on file   Other Topics Concern  . Not on file   Social History Narrative   Retired from [employer] 2015    Current Medications, Allergies, Past Medical History, Past Surgical History, Family History, and Social History were reviewed in [employer] record.   Review of Systems             All symptoms NEG except where BOLDED >>  Constitutional:  F/C/S, fatigue, anorexia, unexpected weight change. HEENT:  HA, visual changes, hearing loss, earache, nasal symptoms, sore throat, mouth sores, hoarseness. Resp:  cough, sputum, hemoptysis; SOB, tightness, wheezing. Cardio:  CP, palpit, DOE, orthopnea, edema. GI:  N/V/D/C, blood in stool; reflux, abd pain, distention, gas. GU:  dysuria, freq, urgency, hematuria, flank pain, voiding difficulty. MS:  joint pain, swelling, tenderness, decr ROM; neck pain, back pain, etc. Neuro:  HA, tremors, seizures, dizziness, syncope, weakness, numbness, gait abn. Skin:  suspicious lesions or skin rash. Heme:  adenopathy, bruising, bleeding. Psyche:  confusion, agitation, sleep disturbance, hallucinations, anxiety, depression suicidal.   Objective:   Physical Exam       Vital Signs:  Reviewed...  General:  WD, WN, 81 y/o WM in NAD; alert & oriented; pleasant & cooperative... HEENT:  [place]/AT; Conjunctiva- pink, Sclera- nonicteric, EOM-wnl, PERRLA, EACs-clear, TMs-wnl; NOSE-clear; THROAT-clear & wnl. Neck:  Supple w/ fair ROM; no JVD; normal carotid impulses w/o bruits; no thyromegaly or nodules palpated; no lymphadenopathy. Chest:  Fine velcro rales 1/2 way up the back & anteriorly as well; no wheezing, rhonchi, or sounds of consolidation... Heart:  Regular Rhythm; norm S1 & S2 Gr1/6 SEM w/o rubs or gallops detected. Abdomen:  Soft & nontender- no guarding or rebound; normal bowel sounds; no organomegaly or masses palpated. Ext:  Normal ROM; without deformities or arthritic changes; no varicose veins, venous insuffic, or  edema;  Pulses intact w/o bruits. Neuro:  CNs II-XII intact; motor testing normal; sensory testing normal; gait normal & balance OK. Derm:  No lesions noted; no rash etc. Lymph:  No cervical, supraclavicular, axillary, or inguinal adenopathy palpated.   Assessment:      IMP/PLANS>>     Dyspnea> on Advair250 & he feels that his breathing is sl better; notes DOE w/ hills & stairs, ok on level ground, denies cough/ sput/ hemoptysis/ CP/ etc...    Pulmonary fibrosis, interstitial lung disease & restrictive physiology in an 81 y/o gentleman> he has fine velcro rales ~1/2 way up the back & mod restriction; Hi-res CT is c/w UIP process and collagen vasc screen is essentially neg x +CPK at 479 (NEG- Sed/CRP, ACE, RF, ANA, ANCA, RNP, Scleroderma, HP panel)...     Ex-smoker w/ ~50 pack-yr smoking hx, quit 8 yrs ago at age 5>  He has smoked enough to cause COPD, but PFT looks mostly restrictive & CT images w/o emphysema but +air trapping on expiration...    Mild OSA w/ AHI=11.3 in REM> sleep eval by [facility] 08/2014 did not meet the threshold for CPAP use...    Hx allergies w/ mult skin test reactions in the past> aware...    Cardiac> Hx PAF & Gr 2 DD...    Medical> HL, IFG, Low-T, DJD/osteopenia, anxiety.Joshua Bullock has an appt 03/09/15 w/ DrRamaswamy to consider poss anti-fibrotic therapy; continue current meds the same for now.  Plan:     Patient's Medications  New Prescriptions   No medications on file  Previous Medications   AMLODIPINE (NORVASC) 5 MG TABLET    Take 1 tablet (5 mg total) by mouth daily.   APIXABAN (ELIQUIS) 5 MG TABS TABLET    Take 1 tablet (5 mg total) by mouth 2 (two) times daily.   ASPIRIN EC 81 MG TABLET    Take 81 mg by mouth daily.   AZELASTINE (ASTELIN) 0.1 % NASAL SPRAY    Place 2 sprays into both nostrils 2 (two) times daily.   CALCIUM-VITAMIN D PO    Take 1 each by mouth daily.     CITALOPRAM (CELEXA) 20 MG TABLET    Take 10 mg by mouth daily.   DILTIAZEM  (CARDIZEM) 30 MG TABLET    Take 1 tablet every 4-6 hours AS NEEDED for fast heart rate.   FLUTICASONE (FLONASE) 50 MCG/ACT NASAL SPRAY    Place 1 spray into both nostrils daily.   FLUTICASONE-SALMETEROL (ADVAIR) 250-50 MCG/DOSE AEPB    Inhale 1 puff into the lungs every 12 (twelve) hours.   HYDROCORTISONE CREAM 1 %    Apply 1 application topically 2 (two) times daily.   MULTIPLE VITAMINS-MINERALS (MULTIVITAMIN,TX-MINERALS) TABLET    Take 1 tablet by mouth daily.     MULTIPLE VITAMINS-MINERALS (PRESERVISION AREDS 2) CAPS    Take 2 capsules by mouth 2 (two) times daily.   OMEPRAZOLE (PRILOSEC OTC) 20 MG TABLET    Take 20 mg by mouth daily.   PRAVASTATIN (PRAVACHOL) 40 MG TABLET    Take 1 tablet (40 mg total) by mouth daily.   TESTOSTERONE (ANDROGEL PUMP) 20.25 MG/ACT (1.62%) GEL    USE 5 PUMPS ON THE SKIN EVERY DAY  Modified Medications   No medications on file  Discontinued Medications   No medications on file

## 2015-02-13 ENCOUNTER — Telehealth: Payer: Self-pay | Admitting: Pulmonary Disease

## 2015-02-13 LAB — ANTI-SCLERODERMA ANTIBODY: SCLERODERMA (SCL-70) (ENA) ANTIBODY, IGG: NEGATIVE

## 2015-02-13 LAB — CK TOTAL AND CKMB (NOT AT ARMC)
CK TOTAL: 479 U/L — AB (ref 7–232)
CK, MB: 8.4 ng/mL — ABNORMAL HIGH (ref 0.0–5.0)
Relative Index: 1.8 (ref 0.0–4.0)

## 2015-02-13 LAB — RNP ANTIBODY: Ribonucleic Protein(ENA) Antibody, IgG: <1

## 2015-02-13 NOTE — Telephone Encounter (Signed)
[facility] - Alert - increased 8.4 See attached lab

## 2015-02-13 NOTE — Telephone Encounter (Signed)
Dr. Lenna Gilford given copy of labs... He is aware of alert.. Awaiting response.

## 2015-02-13 NOTE — Telephone Encounter (Signed)
SN aware of lab value and states he will call pt once all labs are received  Nothing further is needed at this time

## 2015-02-16 LAB — HYPERSENSITIVITY PNEUMONITIS
A. Pullulans Abs: NEGATIVE
A.Fumigatus #1 Abs: NEGATIVE
Micropolyspora faeni, IgG: NEGATIVE
PIGEON SERUM ABS: NEGATIVE
Thermoact. Saccharii: NEGATIVE
Thermoactinomyces vulgaris, IgG: NEGATIVE

## 2015-02-18 ENCOUNTER — Other Ambulatory Visit: Payer: Self-pay | Admitting: Internal Medicine

## 2015-03-09 ENCOUNTER — Ambulatory Visit (INDEPENDENT_AMBULATORY_CARE_PROVIDER_SITE_OTHER): Payer: Medicare Other | Admitting: Internal Medicine

## 2015-03-09 ENCOUNTER — Encounter: Payer: Self-pay | Admitting: Internal Medicine

## 2015-03-09 VITALS — BP 118/70 | HR 73 | Ht 71.0 in | Wt 187.0 lb

## 2015-03-09 DIAGNOSIS — J84112 Idiopathic pulmonary fibrosis: Secondary | ICD-10-CM | POA: Diagnosis not present

## 2015-03-09 NOTE — Patient Instructions (Addendum)
ICD-9-CM ICD-10-CM   1. IPF (idiopathic pulmonary fibrosis) (HCC) 516.31 J84.112     You have IPF  - take education booklet Use portable o2 with exertion > 60 yards TEst for ONO - results will be called in after 03/31/15 only Start esbriet - will do paper work Refer pulmonary rehab  Followup  6 weeks from now with NP/myself to review progress on esbriet and do LFT check At follow-up do ACCP ILD questionnaire   Key points on esbriet Both drugs OFEV and Esbriet only slow down progression, 1 out of 6 patients  - this means extension in quality of life but no difference in symptoms  - no study directly compares the 2 drugs but efficacy roughly equal at 1 year time point  - OFEV  - will avoid due to being on Eliquis and theoretical bleeding risk  - ESBRIET  - 3 pill three times daily, slow titration.  - Need to wean sunscreen  - Some chance of nausea and anorexia with small chance for diarrhea  - no known heart attack risk - no known bleeding risk,   - need monthly blood work for 6 months - monitor liver function - possible mortality benefit in pooled analysis  - larger world wide experience  Disclosure: Dr Chase Caller has participated in trials with Esbriet and has been compensated by Bluford Kaufmann / Celso Amy

## 2015-03-09 NOTE — Progress Notes (Signed)
Subjective:    Patient ID: Joshua Bullock, male    DOB: 08-27-1932, 81 y.o.   MRN: [id number]  HPI     ~  January 08, 2015:  Initial pulmonary consult w/ SN>      27 y/o WM, pt of DrPaz, referred for a pulmonary evaluation due to dyspnea;  He reports a hx of SOB/DOE x several yrs noticed mostly w/ activity eg- walking, stairs, etc; states he walks 64mi ~3d/wk and gets winded, this started several yrs ago & persists to the present but does NOT appear to be progressive by his hx; he has no trouble w/ ADLs etc; he also notes sl cough, min sput from post nasal drip he thinks & he notes mult allergies w/ prev testing in the service & by DrESL- took shots for about 15 yrs- last in 1987...  He had a Sleep study by [facility] 09/06/14 showing AHI=3.3/hr & RDI=5.2/hr (c/w UARS) but AHI during REM was 11.3/hr c/w mild OSA during REM; O2sat was 91% at baseline & dropped to 84% during REM sleep; cardiac was NSR, no periodic limb movements; he did not meet the criteria for CPAP use, advised to not sleep supine where most events occurred...   Smoking Hx>  He is an ex-smoker, starting around age 64, smoked for 77 yrs up to 1ppd, quit about 8 yrs ago at age 35 w/ the help of [employer]...  Pulmonary Hx>  He denies any signif PMHx of lung disease- states he had a viral pneumonia at age 41, but no prob since then, denies repeated bronchitic infections, no hx asthma, never been aware of COPD/emphysema, and denies hx Tb or known exposure...   Medical Hx>  Hx AFib, Diastolic CHF, HL, Low-T, DJD/ osteopenia, anxiety...   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   Current Meds>  ASA81, Amlod5, Cardizem30 prn, Eliquis5Bid, Prav40, Omep20, Androgel, Celexa20  EXAM reveals AFeb, VSS, O2sat=92% on RA at rest;  HEENT-  neg, mallampati1;  Chest- diffuse velcro rales 1/2 up posteriorly and heard anteriorly as well;  Heart- RR, gr1/6 SEM w/o r/g;  Abd- soft, neg;  Ext- w/o c/c/e;  Neuro- ?movement disorder & quite restless during the interview...   Baseline CXR 10/2009 showed norm heart size, mild bronchitic changes, NAD     Last CXR 06/23/14 showed borderline heart size, increased interstitial markings diffusely & atx in LLL  Myoview 06/28/14 showed low risk nuclear study w/ sm area of attenuation inferiorly, norm LVF w/ EF=57%, normal wall motion, etc...  2DEcho 06/2014 showed norm LV size & function w/ EF=60-65%, no regional wall motion abn, severe focal basal septal hypertrophyGr 2 DD, mildly thickened AoV & MV leaflets w/ mild MR; RV- poorly vis...  Full PFTs done 12/14/14 showed FVC=2.28 (54%), FEV1=1.75 (58%), %1sec=77, mid-flows sl reduced at 68% predicted; after the bronchodil the FEV1 was unchanged;  TLC=4.48 (61%), RV=2.18 (79%), RV/TLC=49;  DLCO=47% predicted; these PFTs show a moderate restricted ventilatory defect w/ superimposed small airways disease; Diffusion is significantly reduced...   Routine LABS 9-12/2014:  Chems- wnl;  CBC- wnl w/ Hg=13.8;  Low-T noted...   Ambulatory O2sat test 01/08/15> O2sat=95% on RA at rest w/ pulse=61/min; pt ambulated 3 laps w/ lowest O2sat=89% w/ pulse=86/min  Additional LABS & collagen-vasc screen 10/16>  Sed=17,  CRP=0.6 (norm<20),  ACE=29,  RheumFactor<10,  ANA=neg,  ANCA=neg (MPO & PR-3)...  Hi-resolution CT Chest> Done 01/16/15:  Norm heart size, atherosclerotic changes, mildly enlarged mediastinal LNs (1.2-1.5cm size), no pulm nodules or emphysema, +ILD w/ patchy subpleural reticulation bilat w/ assoc traction bronchiectassis & scatered regions of honeycobing (anterior upper lobes and posterior lower lobes) w/ air trapping on expiration, min areas of GG opac noted; this is felt to be c/w UIP pathology; note: granulomatous calcif in liver & spleen, +gallstone, +DJD  Tspine...  IMP/PLANS>>     Dyspnea due to underlying lung dis> we will start him on ADVAIR250Bid for the time being as we assess the need for Pred etc...    Pulmonary fibrosis, interstitial lung disease & restrictive physiology in an 81 y/o gentleman> he has fine velcro rales ~1/2 way up the back & restrictive physiology on PFTs; we will proceed w/ LABS- collagen vasc screen and Hi-res CTChest.     Ex-smoker w/ ~50 pack-yr smoking hx, quit 8 yrs ago at age 31>  He has smoked enough to cause COPD, but PFT looks mostly restrictive- awaiting CT images...    Mild OSA w/ AHI=11.3 in REM    Hx allergies w/ mult skin test reactions in the past    Cardiac> Hx PAF & Gr 2 DD    Medical> HL, IFG, Low-T, DJD/osteopenia, anxiety   ADDENDUM>>  I called pt & wife 01/18/15 to discuss CT & labs>  Looks like IPF/ UIP and we discussed the nature of this disease & poss treatments w/ supportive care, antireflux regimen, avoiding exac, and consideration of anti-fibrotic therapy;  I have recommended a consult w/ DrRamaswamy to consider this treatment & to see if he feels lung bx is needed or not...    ~  February 12, 2015:  18mo ROV w/ SN>  "Joshua Bullock" feels that his breathing is sl better on the Advair250 & he can tell this by his singing voice & wind in the choir; he continues to walk 87mi 3d/wk- doing OK on level ground but having difficulty w/ hills/ stairs, denies CP/ palpit & hasn't needed NTG or Cardizem prn rxs...    Dyspnea> on [phone] & he feels that his breathing is sl better; notes DOE w/ hills & stairs, ok on level ground, denies cough/ sput/ hemoptysis/ CP/ etc...    Pulmonary fibrosis, interstitial lung disease & restrictive physiology in an 81 y/o gentleman> he has fine velcro rales ~1/2 way up the back & mod restriction; Hi-res CT is c/w UIP process and collagen vasc screen is essentially neg x +CPK at 479 (NEG- Sed/CRP, ACE, RF, ANA, ANCA, RNP, Scleroderma, HP panel)...     Ex-smoker w/ ~50 pack-yr smoking  hx, quit 8 yrs ago at age 31>  He has smoked enough to cause COPD, but PFT looks mostly restrictive & CT images w/o emphysema but +air trapping on expiration...    Mild OSA w/ AHI=11.3 in REM> sleep eval by [facility] 08/2014 did not meet the threshold for CPAP use...    Hx allergies w/ mult skin test reactions in the past> aware...    Cardiac> Hx PAF & Gr 2 DD...    Medical> HL, IFG, Low-T, DJD/osteopenia, anxiety... We reviewed prob list, meds, xrays and labs>> he is allergic to the Flu vaccine he says...  CXR 02/12/15 showed norm heart size, interstitial prominence ?sl worse than 06/2014 films, r/o active pneumonitis superimposed on chronic ILD...  LABS 01/2015 to complete collagen-vasc screen>  Hypersens  Pneumonitis panel= NEG;  Scleroderma Ab= neg;  RNP Ab= neg;  CPK sl elev at 479... IMP/PLAN>>  Joshua Bullock has an appt 03/09/15 w/ DrRamaswamy to consider poss anti-fibrotic therapy; continue current meds the same for now...    OV 03/09/2015  Chief Complaint  Patient presents with  . Pulmonary Consult    Pt here for second opinion from SN for pulmonary fibrosis. Pt c/o DOE and dry cough x 1.5 year. Pt denies CP/tightness and f/c/s.    Referred by Dr. Nicki Reaper in [place] for evaluation of interstitial lung disease.  He has had an extensive workup. I reviewed this workup. Based on male gender, age over 76, lack of strong occupational exposure to suggest hypersensitivity pneumonitis occupational lung disease, noncontributory/near negative autoimmune profile and a classic CT chest the diagnosis here is idiopathic pulmonary fibrosis/UIP  He does have a high CK 479 on 02/12/2015 but he is noted to be on a statin  He is here with his wife   Walking desaturation test 185 feet 3 laps on room air: He desaturated at the end of first lap to 84%.   has a past medical history of Hyperlipidemia; Arthritis; COPD (chronic obstructive pulmonary disease) ([place]) (05/09/2011); Hypogonadism male (09/01/2012); DECREASED  HEARING (11/05/2009); OSTEOPENIA (11/15/2007); HYPERGLYCEMIA (05/17/2008); and Sleep apnea.   reports that he quit smoking about 44 years ago. His smoking use included Cigarettes. He has a 38.25 pack-year smoking history. He has never used smokeless tobacco.  Past Surgical History  Procedure Laterality Date  . Tonsillectomy  1955  . Eye surgery Bilateral 2016    Cataract  . Rotator cuff repair      right    Allergies  Allergen Reactions  . Influenza Virus Vacc Split Pf Other (See Comments)    Hospitalized with temperatures above 103 and classic symptoms of flu  . Tetanus Toxoid Hives    There is no immunization history for the selected administration types on file for this patient.  Family History  Problem Relation Age of Onset  . Prostate cancer Neg Hx   . Colon cancer Neg Hx   . Hypertension Father   . Arthritis Sister   . Breast cancer Mother   . Hypertension Sister   . CAD Neg Hx      Current outpatient prescriptions:  .  amLODipine (NORVASC) 5 MG tablet, Take 1 tablet (5 mg total) by mouth daily., Disp: 30 tablet, Rfl: 11 .  apixaban (ELIQUIS) 5 MG TABS tablet, Take 1 tablet (5 mg total) by mouth 2 (two) times daily., Disp: 60 tablet, Rfl: 11 .  aspirin EC 81 MG tablet, Take 81 mg by mouth daily., Disp: , Rfl:  .  azelastine (ASTELIN) 0.1 % nasal spray, Place 2 sprays into both nostrils 2 (two) times daily., Disp: 30 mL, Rfl: 5 .  CALCIUM-VITAMIN D PO, Take 1 each by mouth daily.  , Disp: , Rfl:  .  citalopram (CELEXA) 20 MG tablet, Take 1 tablet (20 mg total) by mouth daily., Disp: 30 tablet, Rfl: 5 .  diltiazem (CARDIZEM) 30 MG tablet, Take 1 tablet every 4-6 hours AS NEEDED for fast heart rate., Disp: 30 tablet, Rfl: 2 .  fluticasone (FLONASE) 50 MCG/ACT nasal spray, Place 1 spray into both nostrils daily., Disp: , Rfl:  .  Fluticasone-Salmeterol (ADVAIR) 250-50 MCG/DOSE AEPB, Inhale 1 puff into the lungs every 12 (twelve) hours., Disp: 60 each, Rfl: 11 .   hydrocortisone cream 1 %, Apply 1 application topically 2 (two) times daily as  needed. , Disp: , Rfl:  .  Multiple Vitamins-Minerals (MULTIVITAMIN,TX-MINERALS) tablet, Take 1 tablet by mouth daily.  , Disp: , Rfl:  .  Multiple Vitamins-Minerals (PRESERVISION AREDS 2) CAPS, Take 2 capsules by mouth 2 (two) times daily., Disp: , Rfl:  .  omeprazole (PRILOSEC OTC) 20 MG tablet, Take 20 mg by mouth daily., Disp: , Rfl:  .  pravastatin (PRAVACHOL) 40 MG tablet, Take 1 tablet (40 mg total) by mouth daily., Disp: 90 tablet, Rfl: 1 .  Testosterone (ANDROGEL PUMP) 20.25 MG/ACT (1.62%) GEL, USE 5 PUMPS ON THE SKIN EVERY DAY, Disp: 225 g, Rfl: 5    Review of Systems  Constitutional: Negative for fever and unexpected weight change.  HENT: Negative for congestion, dental problem, ear pain, nosebleeds, postnasal drip, rhinorrhea, sinus pressure, sneezing, sore throat and trouble swallowing.   Eyes: Negative for redness and itching.  Respiratory: Positive for cough and shortness of breath. Negative for chest tightness and wheezing.   Cardiovascular: Negative for palpitations and leg swelling.  Gastrointestinal: Negative for nausea and vomiting.  Genitourinary: Negative for dysuria.  Musculoskeletal: Negative for joint swelling.  Skin: Negative for rash.  Neurological: Negative for headaches.  Hematological: Does not bruise/bleed easily.  Psychiatric/Behavioral: Negative for dysphoric mood. The patient is not nervous/anxious.        Objective:   Physical Exam  Filed Vitals:   03/09/15 1044  BP: 118/70  Pulse: 73  Height: 5\' 11"  (1.803 m)  Weight: 187 lb (84.823 kg)  SpO2: 95%    Mostly a discussion visit with him and his wife Brief exam shows he has crackles and some mild visible obesity but otherwise a pleasant gentlemant     Assessment & Plan:     ICD-9-CM ICD-10-CM   1. IPF (idiopathic pulmonary fibrosis) (HCC) 516.31 J84.112 Pulmonary rehab therapeutic exercise     Pulse oximetry,  overnight     Ambulatory Referral for DME    You have IPF  - take education booklet Use portable o2 with exertion > 60 yards TEst for ONO - results will be called in after 03/31/15 only Start esbriet - will do paper work Refer pulmonary rehab  Followup  6 weeks from now with NP/myself to review progress on esbriet and do LFT check  Key points on esbriet Both drugs OFEV and Esbriet only slow down progression, 1 out of 6 patients  - this means extension in quality of life but no difference in symptoms  - no study directly compares the 2 drugs but efficacy roughly equal at 1 year time point  - OFEV  - will avoid due to being on Eliquis and theoretical bleeding risk  - ESBRIET  - 3 pill three times daily, slow titration.  - Need to wean sunscreen  - Some chance of nausea and anorexia with small chance for diarrhea  - no known heart attack risk - no known bleeding risk,   - need monthly blood work for 6 months - monitor liver function - possible mortality benefit in pooled analysis  - larger world wide experience  Disclosure: Dr Chase Caller has participated in trials with Esbriet and has been compensated by [employer]uford Kaufmann / [employer]     > 50% of this > 25 min visit spent in face to face counseling or coordination of care    Dr. Brand Males, M.D., [facility].C.P Pulmonary and Critical Care Medicine Staff Physician [facility] Pager: [phone], If no answer or between  15:00h -  7:00h: call [phone]  03/09/2015 7:09 PM

## 2015-03-14 ENCOUNTER — Telehealth: Payer: Self-pay

## 2015-03-14 MED ORDER — TESTOSTERONE 20.25 MG/ACT (1.62%) TD GEL: TRANSDERMAL | Status: AC

## 2015-03-14 NOTE — Telephone Encounter (Signed)
i printed 

## 2015-03-14 NOTE — Telephone Encounter (Signed)
Received a refill request fro Androgel 1.62% plead advise if we can refill. Last office visit was 01/05/2015. Thanks!

## 2015-03-14 NOTE — Telephone Encounter (Signed)
Rx sent via fax. 

## 2015-03-15 ENCOUNTER — Telehealth: Payer: Self-pay | Admitting: Internal Medicine

## 2015-03-15 NOTE — Telephone Encounter (Signed)
ONO reveiewd from 1216!6 - tme < 89% is 89min 56 sec - 28% of sleep. sTart 1L o2 at night with sleepo

## 2015-03-16 ENCOUNTER — Telehealth: Payer: Self-pay | Admitting: Internal Medicine

## 2015-03-16 NOTE — Telephone Encounter (Signed)
Joshua Esbriet-  °[phone] option 2  °

## 2015-03-16 NOTE — Telephone Encounter (Signed)
Esbriet forms faxed to [employer]. Will await PA.

## 2015-03-16 NOTE — Telephone Encounter (Signed)
Called and spoke to pt. Informed the pt of the results and recs per MR, pt already has a O2 concentrator. Pt aware to use 1lpm at night and has plenty of supplies. Advised pt to call back if he has any questions or concerns. Pt verbalized understanding and denied any further questions or concerns at this time.

## 2015-03-19 ENCOUNTER — Other Ambulatory Visit: Payer: Self-pay | Admitting: Internal Medicine

## 2015-03-20 NOTE — Telephone Encounter (Signed)
Spoke with Joelle from [place] - Case Number: [id number] ([phone] opt 2) She was calling to let us know that they did not have the patient authorization form filled out for the [place] so they had permission from the patient to contact their insurance company. She said that she was able to get a verbal approval from the patient via telephone and now they can move forward with contacting insurance and getting PA processed.  FYI to [place]

## 2015-03-29 ENCOUNTER — Telehealth: Payer: Self-pay | Admitting: Internal Medicine

## 2015-03-29 NOTE — Telephone Encounter (Signed)
Called [facility] and spoke with [place]. Started PA over the phone. This will go into review. Will take up to 72hrs to receive a response. Case # [id number]  Will forward to [facility].

## 2015-03-29 NOTE — Telephone Encounter (Signed)
No PA in triage on pt. Called [facility] pharm was advised they are having a gigh call volume and current wait time was 37 mins. WCB

## 2015-03-30 ENCOUNTER — Encounter: Payer: Self-pay | Admitting: Internal Medicine

## 2015-03-30 NOTE — Telephone Encounter (Signed)
Decision has not yet been made, will call back Monday.

## 2015-04-02 ENCOUNTER — Telehealth: Payer: Self-pay | Admitting: Internal Medicine

## 2015-04-02 ENCOUNTER — Ambulatory Visit (HOSPITAL_COMMUNITY): Payer: Medicare Other

## 2015-04-02 NOTE — Telephone Encounter (Signed)
[place] to check status of PA-states PA is still being processed.  Will continue to check up on.

## 2015-04-02 NOTE — Telephone Encounter (Signed)
Received fax that [place] had been denied. Will fax forms received to [facility].

## 2015-04-02 NOTE — Telephone Encounter (Signed)
Called and spoke with Delsa Sale at [place]. She states that she was calling to get status of PA done for Esbriet. I explained to her the below. She stated that she would inform Cecille Rubin of status. Nothing further needed at this time.   Renelda Mom, LPN at [id number] PM     Status: Signed       Expand All Collapse All   Received fax that [place] had been denied. Will fax forms received to [facility]

## 2015-04-03 NOTE — Telephone Encounter (Signed)
I forwarded the papers I received from [place] to [place]. I didn't initiate PA. Papers were faxed to front fax and to [facility] fax machine. Daneil Dan should have papers stating. Thanks.

## 2015-04-03 NOTE — Telephone Encounter (Signed)
Why did they deny it? AGe? He is still at age 82 the upper limit of the trials? HE has classic  IPF

## 2015-04-04 NOTE — Telephone Encounter (Signed)
Daneil Dan please advise if you have these forms, or if we need to re-request them.  Thanks!

## 2015-04-04 NOTE — Telephone Encounter (Signed)
It states on the appeal form the reason for denial is: the exclusion of other know causes of ILD. Called for an appeal and was on hold for several minutes during clinic, will call back [phone]).

## 2015-04-05 NOTE — Telephone Encounter (Signed)
atc the below listed number X2, line was disconnected both times as I was entering pt's member ID number as prompted.   Will call back.

## 2015-04-06 NOTE — Telephone Encounter (Signed)
Attempted to contact the appeals department. Every time I called the number below I received a fast busy signal. Will try back.

## 2015-04-09 ENCOUNTER — Ambulatory Visit (HOSPITAL_COMMUNITY): Payer: Medicare Other

## 2015-04-09 NOTE — Telephone Encounter (Signed)
Called [employer] [phone]) - Member ID: [id number] Closed for the Holiday and will reopen 04/10/15.  Will call back.

## 2015-04-10 NOTE — Telephone Encounter (Signed)
Called [employer] at the below number-when I entered the Member ID I was told the ID # was incorrect.  I attempted to reach a representative but the line was ended.   lmtcb X1 to contact pt to see if he's received a new insurance card for the year.

## 2015-04-11 ENCOUNTER — Telehealth: Payer: Self-pay | Admitting: *Deleted

## 2015-04-11 NOTE — Telephone Encounter (Signed)
Received [place] from [facility]; forwarded to provider/SLS 01/18

## 2015-04-12 NOTE — Telephone Encounter (Signed)
lmtcb x2 for pt to get new insurance information.

## 2015-04-13 NOTE — Telephone Encounter (Signed)
lmtcb X3 for pt. 

## 2015-04-16 ENCOUNTER — Telehealth: Payer: Self-pay | Admitting: Internal Medicine

## 2015-04-16 NOTE — Telephone Encounter (Signed)
Spoke with [place] at [employer]. Wanted to know if we are going to be appealing the denial on Esbriet. Advised him that we don't have anything documented about this being denied.  Daneil Dan - was anything received via fax on this?

## 2015-04-17 ENCOUNTER — Telehealth: Payer: Self-pay | Admitting: Internal Medicine

## 2015-04-17 NOTE — Telephone Encounter (Signed)
Yes, the denial needs to be appealed. See phone note from 1.5.2017.

## 2015-04-17 NOTE — Telephone Encounter (Signed)
Called [employer] and spoke to rep and advised the denial needs to be appealed. The rep stated she is looking into the appeals information and will call back with more information.

## 2015-04-18 NOTE — Telephone Encounter (Signed)
pls state in letter that he has classic IPF based on male gender, age 82, classic CT chest and negative autoimmune profile. In addition his severity is moderate based on FVC 54% and DLCO 47%. And with age 6 this is exact population that was studied where anti-fibrotic Rx was benefitical. So, we do not understand reason for denial

## 2015-04-18 NOTE — Telephone Encounter (Signed)
Called and spoke to rep at [employer] and informed her we are needing to appeal the PA denial for the pt's Esbriet. She advised me to follow the instructions on the appeal document. A letter will need to be sent along with supporting document to fax number: [phone].  Dr. Chase Caller please advise what you would like in the letter. Thanks.

## 2015-04-19 NOTE — Telephone Encounter (Signed)
Joshua Dan, do you have the appeal letter to follow instructions on creating our appeal letter?

## 2015-04-19 NOTE — Telephone Encounter (Signed)
Daneil Dan, have you received anything on doing an appeal for this pt?

## 2015-04-20 ENCOUNTER — Encounter: Payer: Self-pay | Admitting: Adult Health

## 2015-04-20 ENCOUNTER — Ambulatory Visit (INDEPENDENT_AMBULATORY_CARE_PROVIDER_SITE_OTHER)
Admission: RE | Admit: 2015-04-20 | Discharge: 2015-04-20 | Disposition: A | Discharge status: Home / Self Care | Payer: Medicare Other | Source: Ambulatory Visit | Attending: Adult Health | Admitting: Adult Health

## 2015-04-20 ENCOUNTER — Ambulatory Visit (INDEPENDENT_AMBULATORY_CARE_PROVIDER_SITE_OTHER): Payer: Medicare Other | Admitting: Adult Health

## 2015-04-20 VITALS — BP 112/62 | HR 79 | Temp 98.1°F | Ht 70.0 in | Wt 180.0 lb

## 2015-04-20 DIAGNOSIS — J438 Other emphysema: Secondary | ICD-10-CM | POA: Diagnosis not present

## 2015-04-20 DIAGNOSIS — J9611 Chronic respiratory failure with hypoxia: Secondary | ICD-10-CM

## 2015-04-20 DIAGNOSIS — J8410 Pulmonary fibrosis, unspecified: Secondary | ICD-10-CM

## 2015-04-20 DIAGNOSIS — J9610 Chronic respiratory failure, unspecified whether with hypoxia or hypercapnia: Secondary | ICD-10-CM | POA: Insufficient documentation

## 2015-04-20 MED ORDER — PREDNISONE 10 MG PO TABS: ORAL_TABLET | ORAL | Status: DC

## 2015-04-20 NOTE — Assessment & Plan Note (Signed)
Adjust O2 to keep O2 sats >88-90%.   Plan  Check CXR today  Begin prednisone taper.  Increase Oxygen 3l/ rest and 4l/m w/ activity -use only on continuous setting.  Follow up Dr. Chase Caller or [place] NP in 6-8 weeks (after you start Esbriet )  Please contact office for sooner follow up if symptoms do not improve or worsen or seek emergency care

## 2015-04-20 NOTE — Assessment & Plan Note (Signed)
IPF -suspect flare with decline over last week with increased cough and increased O2 demands.  Check cxr  Steroid challenge  Adjust O2 to keep sats >90%  Plan  Check CXR today  Begin prednisone taper.  Increase Oxygen 3l/ rest and 4l/m w/ activity -use only on continuous setting.  Follow up Dr. Chase Caller or Guelda Batson NP in 6-8 weeks (after you start Esbriet )  Please contact office for sooner follow up if symptoms do not improve or worsen or seek emergency care

## 2015-04-20 NOTE — Progress Notes (Signed)
Subjective:    Patient ID: Joshua Bullock, male    DOB: 1932-06-01, 82 y.o.   MRN: [id number]  HPI 82 yo male with ILD suspected IPF   TEST   Baseline CXR 10/2009 showed norm heart size, mild bronchitic changes, NAD   Last CXR 06/23/14 showed borderline heart size, increased interstitial markings diffusely & atx in LLL  Myoview 06/28/14 showed low risk nuclear study w/ sm area of attenuation inferiorly, norm LVF w/ EF=57%, normal wall motion, etc...  2DEcho 06/2014 showed norm LV size & function w/ EF=60-65%, no regional wall motion abn, severe focal basal septal hypertrophyGr 2 DD, mildly thickened AoV & MV leaflets w/ mild MR; RV- poorly vis...  Full PFTs done 12/14/14 showed FVC=2.28 (54%), FEV1=1.75 (58%), %1sec=77, mid-flows sl reduced at 68% predicted; after the bronchodil the FEV1 was unchanged; TLC=4.48 (61%), RV=2.18 (79%), RV/TLC=49; DLCO=47% predicted; these PFTs show a moderate restricted ventilatory defect w/ superimposed small airways disease; Diffusion is significantly reduced...   Routine LABS 9-12/2014: Chems- wnl; CBC- wnl w/ Hg=13.8; Low-T noted...   Ambulatory O2sat test 01/08/15> O2sat=95% on RA at rest w/ pulse=61/min; pt ambulated 3 laps w/ lowest O2sat=89% w/ pulse=86/min  Additional LABS & collagen-vasc screen 10/16> Sed=17, CRP=0.6 (norm<20), ACE=29, RheumFactor<10, ANA=neg, ANCA=neg (MPO & PR-3)...  Hi-resolution CT Chest> Done 01/16/15: Norm heart size, atherosclerotic changes, mildly enlarged mediastinal LNs (1.2-1.5cm size), no pulm nodules or emphysema, +ILD w/ patchy subpleural reticulation bilat w/ assoc traction bronchiectassis & scatered regions of honeycobing (anterior upper lobes and posterior lower lobes) w/ air trapping on expiration, min areas of GG opac noted; this is felt to be c/w UIP pathology; note: granulomatous calcif in liver & spleen, +gallstone, +DJD Tspine...  04/20/2015  Follow up : IPF  Pt returns for 6 weeks follow up .   Was started on Esbriet last ov , paperwork is pending as it is going through appeal process.  Says he is doing about the same , gets winded with minimal activity . Wears out easily. Does not have much energy. Has dry cough most days. Feels he is worse for last 2 weeks . Can not walk any distance without severe dyspnea.  No fever , discolored mucus , orthopnea or edema.  On O2 at 2l/m. desats on pulse o2 at 2l/m , takes ~3 min to recover . Sats drop very low in 70% on pulsing O2.  Starting pulmonary rehab next week.    Past Medical History  Diagnosis Date  . Hyperlipidemia   . Arthritis   . COPD (chronic obstructive pulmonary disease) ([place]) 05/09/2011    Former smoker, fine crackles at bases on exam. PFTs 2012 documented mild-moderate dz    . Hypogonadism male 09/01/2012  . DECREASED HEARING 11/05/2009    Qualifier: Diagnosis of  By: Marijean Niemann CMA, Danielle    . OSTEOPENIA 11/15/2007    Per DEXA 2008    . HYPERGLYCEMIA 05/17/2008    Qualifier: Diagnosis of  By: Larose Kells MD, Alda Berthold Sleep apnea    Current Outpatient Prescriptions on File Prior to Visit  Medication Sig Dispense Refill  . amLODipine (NORVASC) 5 MG tablet Take 1 tablet (5 mg total) by mouth daily. 30 tablet 11  . apixaban (ELIQUIS) 5 MG TABS tablet Take 1 tablet (5 mg total) by mouth 2 (two) times daily. 60 tablet 11  . aspirin EC 81 MG tablet Take 81 mg by mouth daily.    Marland Kitchen CALCIUM-VITAMIN D PO Take 1 each by mouth  daily.      . citalopram (CELEXA) 20 MG tablet Take 1 tablet (20 mg total) by mouth daily. 30 tablet 5  . diltiazem (CARDIZEM) 30 MG tablet Take 1 tablet every 4-6 hours AS NEEDED for fast heart rate. 30 tablet 2  . Fluticasone-Salmeterol (ADVAIR) 250-50 MCG/DOSE AEPB Inhale 1 puff into the lungs every 12 (twelve) hours. 60 each 11  . hydrocortisone cream 1 % Apply 1 application topically 2 (two) times daily as needed.     . Multiple Vitamins-Minerals (MULTIVITAMIN,TX-MINERALS) tablet Take 1 tablet by mouth daily.       . Multiple Vitamins-Minerals (PRESERVISION AREDS 2) CAPS Take 2 capsules by mouth 2 (two) times daily.    Marland Kitchen omeprazole (PRILOSEC OTC) 20 MG tablet Take 20 mg by mouth daily.    . pravastatin (PRAVACHOL) 40 MG tablet Take 1 tablet (40 mg total) by mouth daily. 90 tablet 0  . Testosterone (ANDROGEL PUMP) 20.25 MG/ACT (1.62%) GEL USE 5 PUMPS ON THE SKIN EVERY DAY 225 g 5  . azelastine (ASTELIN) 0.1 % nasal spray Place 2 sprays into both nostrils 2 (two) times daily. (Patient not taking: Reported on 04/20/2015) 30 mL 5  . fluticasone (FLONASE) 50 MCG/ACT nasal spray Place 1 spray into both nostrils daily. Reported on 04/20/2015     No current facility-administered medications on file prior to visit.      Review of Systems     Constitutional:   No  weight loss, night sweats,  Fevers, chills, +fatigue, or  lassitude.  HEENT:   No headaches,  Difficulty swallowing,  Tooth/dental problems, or  Sore throat,                No sneezing, itching, ear ache, nasal congestion, post nasal drip,   CV:  No chest pain,  Orthopnea, PND, swelling in lower extremities, anasarca, dizziness, palpitations, syncope.   GI  No heartburn, indigestion, abdominal pain, nausea, vomiting, diarrhea, change in bowel habits, loss of appetite, bloody stools.   Resp:  .  No excess mucus, no productive cough,   No coughing up of blood.  No change in color of mucus.  No wheezing.  No chest wall deformity  Skin: no rash or lesions.  GU: no dysuria, change in color of urine, no urgency or frequency.  No flank pain, no hematuria   MS:  No joint pain or swelling.  No decreased range of motion.  No back pain.  Psych:  No change in mood or affect. No depression or anxiety.  No memory loss.      Objective:   Physical Exam  Filed Vitals:   04/20/15 1043  BP: 112/62  Pulse: 79  Temp: 98.1 F (36.7 C)  TempSrc: Oral  Height: 5\' 10"  (1.778 m)  Weight: 180 lb (81.647 kg)  SpO2: 93%   GEN: A/Ox3; pleasant , NAD  elderly and frail on o2   HEENT:  [place]/AT,  EACs-clear, TMs-wnl, NOSE-clear, THROAT-clear, no lesions, no postnasal drip or exudate noted.   NECK:  Supple w/ fair ROM; no JVD; normal carotid impulses w/o bruits; no thyromegaly or nodules palpated; no lymphadenopathy.  RESP BB crackles .no accessory muscle use, no dullness to percussion  CARD:  RRR, no m/r/g  , no peripheral edema, pulses intact, no cyanosis or clubbing.  GI:   Soft & nt; nml bowel sounds; no organomegaly or masses detected.  Musco: Warm bil, no deformities or joint swelling noted.   Neuro: alert, no focal deficits noted.  Skin: Warm, no lesions or rashes        Assessment & Plan:

## 2015-04-20 NOTE — Patient Instructions (Signed)
Check CXR today  Begin prednisone taper.  Increase Oxygen 3l/ rest and 4l/m w/ activity -use only on continuous setting.  Follow up Dr. Chase Caller or [place] NP in 6-8 weeks (after you start Esbriet )  Please contact office for sooner follow up if symptoms do not improve or worsen or seek emergency care

## 2015-04-23 ENCOUNTER — Encounter (HOSPITAL_COMMUNITY): Payer: Self-pay

## 2015-04-23 ENCOUNTER — Encounter: Payer: Self-pay | Admitting: Internal Medicine

## 2015-04-23 ENCOUNTER — Encounter (HOSPITAL_COMMUNITY)
Admission: RE | Admit: 2015-04-23 | Discharge: 2015-04-23 | Disposition: A | Discharge status: Home / Self Care | Payer: Medicare Other | Source: Ambulatory Visit | Attending: Internal Medicine | Admitting: Internal Medicine

## 2015-04-23 VITALS — BP 131/62 | HR 85

## 2015-04-23 DIAGNOSIS — J8410 Pulmonary fibrosis, unspecified: Secondary | ICD-10-CM | POA: Diagnosis present

## 2015-04-23 NOTE — Progress Notes (Signed)
Joshua Bullock 82 y.o. male Pulmonary Rehab Orientation Note Patient arrived today in [facility] and [facility] Rehab for orientation to Pulmonary Rehab. He was transported from [employer] via wheel chair. He does carry portable oxygen. Per pt, he uses oxygen continuously. He was ordered to wear oxygen @ 2 liters with activity in December, but has progressed to 4 liters of continuous oxygen in the last 2 months.  He seems to be adjusting to this change well.  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  Discussed when to replace oxygen tubing.  Color good, skin warm and dry. Patient is oriented to time and place. Patient's medical history, psychosocial health, and medications reviewed. Psychosocial assessment reveals pt lives with their spouse of 37 years. Pt is currently retired from [employer].  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 Pt hobbies include reading. He enjoys history, both reading and researching on the computer.  He used to make and design "chrismons" which are beaded christmas ornaments and has actually written a book on that subject.  He is not able to perform this hobby due to the tedious nature of it.  Pt reports his stress level is low. Areas of stress/anxiety include his Health.  This new diagnosis of pulmonary fibrosis has just been in the last 3 months.  Pt does not exhibit signs of depression.  PHQ2/9 score 0/0. Pt shows good  coping skills with positive outlook .  Will continue to monitor and evaluate psychosocial concerns if any arise.   Physical assessment reveals heart rate is normal, breath sounds clear to auscultation, with crackles from bases to half way up bilaterally.  . Grip strength equal, strong. Distal pulses 3+ bilateral posterior tibial pulses present. Patient reports he does take medications as prescribed. Patient states he follows a Regular diet. The patient reports no specific efforts to gain or lose weight.. Patient's weight will be monitored  closely. Demonstration and practice of PLB using pulse oximeter. Patient able to return demonstration satisfactorily. Safety and hand hygiene in the exercise area reviewed with patient. Patient voices understanding of the information reviewed. Department expectations discussed with patient and achievable goals were set. The patient shows enthusiasm about attending the program and we look forward to working with this nice gentleman. The patient is scheduled for a 6 min walk test on Thursday, April 26, 2015 @ 3:30pm and to begin exercise on Tuesday, May 01, 2015 in the 1:30 pm class.  His short term goal is to increase his strength and stamina so he can participate in the [place] and continue his volunteer work, which is his long term goal.   [phone]

## 2015-04-23 NOTE — Telephone Encounter (Signed)
Based upon the letter received by [facility].  Appeal needs to be submitted to:   Part D Appeals and Grievance Department [place] [address]  Letter created per MR's instruction.  Copies of labs, CT and PFT were submitted with letter as proof.  Letter mailed to above address per Appeal standards. Nothing further needed.

## 2015-04-24 NOTE — Progress Notes (Signed)
Quick Note:  Called and spoke with pt. Reviewed results and recs. Pt stated that the prednisone had helped and he was feeling much better. Pt voiced understanding and had no further questions. ______

## 2015-04-26 ENCOUNTER — Telehealth (HOSPITAL_COMMUNITY): Payer: Self-pay | Admitting: *Deleted

## 2015-04-26 ENCOUNTER — Encounter (HOSPITAL_COMMUNITY)
Admission: RE | Admit: 2015-04-26 | Discharge: 2015-04-26 | Disposition: A | Discharge status: Home / Self Care | Payer: Medicare Other | Source: Ambulatory Visit | Attending: Pulmonary Disease | Admitting: Pulmonary Disease

## 2015-04-26 ENCOUNTER — Telehealth: Payer: Self-pay | Admitting: Internal Medicine

## 2015-04-26 DIAGNOSIS — J8410 Pulmonary fibrosis, unspecified: Secondary | ICD-10-CM | POA: Insufficient documentation

## 2015-04-26 NOTE — Telephone Encounter (Signed)
Ok for o2 pendent

## 2015-04-26 NOTE — Telephone Encounter (Signed)
Spoke with Remo Lipps at Pulmonary rehab. Remo Lipps stated the pt is now needing 10 lpm O2 while ambulating and needs an O2 pendent by Tuesday 2.7.17 for his next rehab class.   Dr. Chase Caller please advise. Thanks.

## 2015-04-26 NOTE — Progress Notes (Signed)
Joshua Bullock completed a Six-Minute Walk Test on 04/26/15 . [name] walked 473 feet with 2 breaks.  The patient's lowest oxygen saturation was 83% on 6L, increase O2 8L and SpO2 came up to 87%, increase O2 to 10L in which he came up to 96%. Highest heart rate was 96 bpm , and highest blood pressure was 140/64. The patient was on 10 liters of oxygen with a nasal cannula. Patient reported no pain.    Juliana Boling [employer]

## 2015-04-27 NOTE — Telephone Encounter (Signed)
lmtcb X1 for Remo Lipps at [employer]

## 2015-04-27 NOTE — Telephone Encounter (Signed)
[place] rehab returned call  [phone]

## 2015-04-27 NOTE — Telephone Encounter (Signed)
Spoke with portia at pulm rehab, aware that MR has ok'ed pendant. Pt is aware that he needs to pick this up in the office.  Pendant left up front for pt pickup.  Nothing further needed at this time.

## 2015-05-01 ENCOUNTER — Encounter (HOSPITAL_COMMUNITY): Admission: RE | Admit: 2015-05-01 | Payer: Medicare Other | Source: Ambulatory Visit

## 2015-05-01 ENCOUNTER — Telehealth (HOSPITAL_COMMUNITY): Payer: Self-pay | Admitting: *Deleted

## 2015-05-03 ENCOUNTER — Encounter (HOSPITAL_COMMUNITY)
Admission: RE | Admit: 2015-05-03 | Discharge: 2015-05-03 | Disposition: A | Discharge status: Home / Self Care | Payer: Medicare Other | Source: Ambulatory Visit | Attending: Internal Medicine | Admitting: Internal Medicine

## 2015-05-03 DIAGNOSIS — J8410 Pulmonary fibrosis, unspecified: Secondary | ICD-10-CM | POA: Diagnosis not present

## 2015-05-03 NOTE — Progress Notes (Signed)
Today, [name] exercised at [employer]. [facility]. Service time was from 1330 to 1530.  The patient exercised by performing aerobic, strengthening, and stretching exercises. Oxygen saturation, heart rate, blood pressure, rate of perceived exertion, and shortness of breath were all monitored before, during, and after exercise. Joshua Bullock presented with severe desaturations while walking 1/2 of a lap on 10 liters of oxygen to 77% at today's exercise session. He was changed to seated exercise and tolerated this much better and maintained his saturations to low 90's.Patient attended the [facility] class today with his wife.  The patient did not have an increase in workload intensity during today's exercise session.  Pre-exercise vitals: . Weight kg: 82.1 . Liters of O2: 4 . SpO2: 92 . HR: 94 . BP: 100/50 . CBG: na  Exercise vitals: . Highest heartrate:  122 . Lowest oxygen saturation: 77-95 . Highest blood pressure: 100/60 . Liters of 02: 10  Post-exercise vitals: . SpO2: 79-91 . HR: 101 . BP: 96/60 . Liters of O2: 4-91 . CBG: na Dr. Rush Farmer, Medical Director Dr. [facility] is immediately available during today's Pulmonary Rehab session for Joshua Bullock on 05/03/2015  at 1330 class time.  Marland Kitchen

## 2015-05-04 ENCOUNTER — Other Ambulatory Visit: Payer: Self-pay

## 2015-05-04 NOTE — Telephone Encounter (Signed)
Called [facility] to follow up on appeal.  Appeal has been overturned and Esbriet is approved, but pt has a high copay.  Joshua Bullock, please advise if any copay assistance has been filled out for pt.  Thanks!

## 2015-05-07 ENCOUNTER — Telehealth: Payer: Self-pay | Admitting: Internal Medicine

## 2015-05-07 NOTE — Telephone Encounter (Signed)
LMTCB for Joshua Bullock, with [facility] Amy, to inquire what the pt's options are since he was denied financial assistance.

## 2015-05-07 NOTE — Telephone Encounter (Signed)
Spoke with the pt's spouse  She states that the pt is having increased cough x 2 days- non prod  She also states that his sats have been lower than usual and he has been having some chest tightness  OV with TP for tomorrow at 3:45 pm  Seek emergent care sooner if needed

## 2015-05-07 NOTE — Telephone Encounter (Signed)
Spoke with pt's wife. She was supposed to call Dr. Larose Kells office. Advised her that she called pulmonary and not primary care. Dr. Ethel Rana number was given to the pt. Nothing further was needed.

## 2015-05-07 NOTE — Telephone Encounter (Signed)
Joshua Bullock from [place] [phone] calling, said that she received a call from [place].  Do not see anything in chart.    Daneil Dan - did you call Joshua Bullock?

## 2015-05-08 ENCOUNTER — Telehealth: Payer: Self-pay | Admitting: Internal Medicine

## 2015-05-08 ENCOUNTER — Encounter: Payer: Self-pay | Admitting: Adult Health

## 2015-05-08 ENCOUNTER — Encounter (HOSPITAL_COMMUNITY): Payer: Medicare Other

## 2015-05-08 ENCOUNTER — Ambulatory Visit (INDEPENDENT_AMBULATORY_CARE_PROVIDER_SITE_OTHER): Payer: Medicare Other | Admitting: Adult Health

## 2015-05-08 VITALS — BP 132/62 | HR 93 | Temp 97.9°F | Ht 70.0 in | Wt 179.0 lb

## 2015-05-08 DIAGNOSIS — J438 Other emphysema: Secondary | ICD-10-CM

## 2015-05-08 DIAGNOSIS — J8410 Pulmonary fibrosis, unspecified: Secondary | ICD-10-CM

## 2015-05-08 DIAGNOSIS — J9611 Chronic respiratory failure with hypoxia: Secondary | ICD-10-CM

## 2015-05-08 MED ORDER — PREDNISONE 10 MG PO TABS: ORAL_TABLET | ORAL | Status: DC

## 2015-05-08 NOTE — Telephone Encounter (Signed)
Please see phone note from 1.24.17 for more details. I called Stacy to find out what the pt's options are for financial assistance for his [name] has he was denied grants. WCB.

## 2015-05-08 NOTE — Telephone Encounter (Signed)
Please see phone note from 2/13. Will sign off.

## 2015-05-08 NOTE — Telephone Encounter (Signed)
Pt saw TP today 2/14 and was advised to see MR back in 2-4 weeks. MR is it okay to overbook? thanks

## 2015-05-08 NOTE — Patient Instructions (Addendum)
Restart  prednisone taper and hold at prednisone 10mg  daily until back in office. .  Continue Oxygen 3l/ rest and 4l/m w/ activity -use only on continuous setting.  Follow up Dr. Chase Caller in 3-4 weeks and As needed   Please contact office for sooner follow up if symptoms do not improve or worsen or seek emergency care

## 2015-05-08 NOTE — Assessment & Plan Note (Signed)
  Continue on Oxygen 3l/ rest and 4l/m w/ activity -use only on continuous setting.  Follow up Dr. Chase Caller in 3-4 weeks and As needed   Please contact office for sooner follow up if symptoms do not improve or worsen or seek emergency care

## 2015-05-08 NOTE — Progress Notes (Signed)
Subjective:    Patient ID: Joshua Bullock, male    DOB: 07-21-1932, 82 y.o.   MRN: [id number]  HPI 82 yo male with ILD suspected IPF   TEST   Baseline CXR 10/2009 showed norm heart size, mild bronchitic changes, NAD   Last CXR 06/23/14 showed borderline heart size, increased interstitial markings diffusely & atx in LLL  Myoview 06/28/14 showed low risk nuclear study w/ sm area of attenuation inferiorly, norm LVF w/ EF=57%, normal wall motion, etc...  2DEcho 06/2014 showed norm LV size & function w/ EF=60-65%, no regional wall motion abn, severe focal basal septal hypertrophyGr 2 DD, mildly thickened AoV & MV leaflets w/ mild MR; RV- poorly vis...  Full PFTs done 12/14/14 showed FVC=2.28 (54%), FEV1=1.75 (58%), %1sec=77, mid-flows sl reduced at 68% predicted; after the bronchodil the FEV1 was unchanged; TLC=4.48 (61%), RV=2.18 (79%), RV/TLC=49; DLCO=47% predicted; these PFTs show a moderate restricted ventilatory defect w/ superimposed small airways disease; Diffusion is significantly reduced...   Routine LABS 9-12/2014: Chems- wnl; CBC- wnl w/ Hg=13.8; Low-T noted...   Ambulatory O2sat test 01/08/15> O2sat=95% on RA at rest w/ pulse=61/min; pt ambulated 3 laps w/ lowest O2sat=89% w/ pulse=86/min  Additional LABS & collagen-vasc screen 10/16> Sed=17, CRP=0.6 (norm<20), ACE=29, RheumFactor<10, ANA=neg, ANCA=neg (MPO & PR-3)...  Hi-resolution CT Chest> Done 01/16/15: Norm heart size, atherosclerotic changes, mildly enlarged mediastinal LNs (1.2-1.5cm size), no pulm nodules or emphysema, +ILD w/ patchy subpleural reticulation bilat w/ assoc traction bronchiectassis & scatered regions of honeycobing (anterior upper lobes and posterior lower lobes) w/ air trapping on expiration, min areas of GG opac noted; this is felt to be c/w UIP pathology; note: granulomatous calcif in liver & spleen, +gallstone, +DJD Tspine...  05/08/2015  Follow up : IPF  Pt returns for 2  weeks follow up  .Treated for IPF flare last ov with prednisone taper. Says he felt a lot better with decreased DOE/cough. CXR showed increased interstitial marking. Denies leg swelling or orthopnea.  Says once he stopped prednisone breathing is going down hill and cough is starting to increase. No fever, discolored mucus, chest pain, orthopnea, or edema.  Has been started on Esbriet , paperwork has been going through the appeal process. He says Joshua Bullock is scheduled for delivery this week.    Past Medical History  Diagnosis Date  . Hyperlipidemia   . Arthritis   . COPD (chronic obstructive pulmonary disease) ([place]) 05/09/2011    Former smoker, fine crackles at bases on exam. PFTs 2012 documented mild-moderate dz    . Hypogonadism male 09/01/2012  . DECREASED HEARING 11/05/2009    Qualifier: Diagnosis of  By: Marijean Niemann CMA, Danielle    . OSTEOPENIA 11/15/2007    Per DEXA 2008    . HYPERGLYCEMIA 05/17/2008    Qualifier: Diagnosis of  By: Larose Kells MD, Alda Berthold Sleep apnea    Current Outpatient Prescriptions on File Prior to Visit  Medication Sig Dispense Refill  . amLODipine (NORVASC) 5 MG tablet Take 1 tablet (5 mg total) by mouth daily. 30 tablet 11  . apixaban (ELIQUIS) 5 MG TABS tablet Take 1 tablet (5 mg total) by mouth 2 (two) times daily. 60 tablet 11  . aspirin EC 81 MG tablet Take 81 mg by mouth daily.    Marland Kitchen CALCIUM-VITAMIN D PO Take 1 each by mouth daily.      . citalopram (CELEXA) 20 MG tablet Take 1 tablet (20 mg total) by mouth daily. 30 tablet 5  . Fluticasone-Salmeterol (ADVAIR)  250-50 MCG/DOSE AEPB Inhale 1 puff into the lungs every 12 (twelve) hours. 60 each 11  . hydrocortisone cream 1 % Apply 1 application topically 2 (two) times daily as needed.     . Multiple Vitamins-Minerals (MULTIVITAMIN,TX-MINERALS) tablet Take 1 tablet by mouth daily.      . Multiple Vitamins-Minerals (PRESERVISION AREDS 2) CAPS Take 2 capsules by mouth 2 (two) times daily.    Marland Kitchen omeprazole (PRILOSEC OTC) 20 MG tablet Take 20  mg by mouth daily. Reported on 04/23/2015    . pravastatin (PRAVACHOL) 40 MG tablet Take 1 tablet (40 mg total) by mouth daily. 90 tablet 0  . Testosterone (ANDROGEL PUMP) 20.25 MG/ACT (1.62%) GEL USE 5 PUMPS ON THE SKIN EVERY DAY 225 g 5  . azelastine (ASTELIN) 0.1 % nasal spray Place 2 sprays into both nostrils 2 (two) times daily. (Patient not taking: Reported on 05/08/2015) 30 mL 5  . diltiazem (CARDIZEM) 30 MG tablet Take 1 tablet every 4-6 hours AS NEEDED for fast heart rate. (Patient not taking: Reported on 05/08/2015) 30 tablet 2  . fluticasone (FLONASE) 50 MCG/ACT nasal spray Place 1 spray into both nostrils daily. Reported on 05/08/2015     No current facility-administered medications on file prior to visit.      Review of Systems     Constitutional:   No  weight loss, night sweats,  Fevers, chills, +fatigue, or  lassitude.  HEENT:   No headaches,  Difficulty swallowing,  Tooth/dental problems, or  Sore throat,                No sneezing, itching, ear ache, nasal congestion, post nasal drip,   CV:  No chest pain,  Orthopnea, PND, swelling in lower extremities, anasarca, dizziness, palpitations, syncope.   GI  No heartburn, indigestion, abdominal pain, nausea, vomiting, diarrhea, change in bowel habits, loss of appetite, bloody stools.   Resp:  .  Marland Kitchen  No chest wall deformity  Skin: no rash or lesions.  GU: no dysuria, change in color of urine, no urgency or frequency.  No flank pain, no hematuria   MS:  No joint pain or swelling.  No decreased range of motion.  No back pain.  Psych:  No change in mood or affect. No depression or anxiety.  No memory loss.      Objective:   Physical Exam  Filed Vitals:   05/08/15 1554  BP: 132/62  Pulse: 93  Temp: 97.9 F (36.6 C)  TempSrc: Oral  Height: 5\' 10"  (1.778 m)  Weight: 179 lb (81.194 kg)  SpO2: 92%   GEN: A/Ox3; pleasant , NAD elderly and frail on o2   HEENT:  [place]/AT,  EACs-clear, TMs-wnl, NOSE-clear, THROAT-clear,  no lesions, no postnasal drip or exudate noted.   NECK:  Supple w/ fair ROM; no JVD; normal carotid impulses w/o bruits; no thyromegaly or nodules palpated; no lymphadenopathy.  RESP BB crackles .no accessory muscle use, no dullness to percussion  CARD:  RRR, no m/r/g  , no peripheral edema, pulses intact, no cyanosis or clubbing.  GI:   Soft & nt; nml bowel sounds; no organomegaly or masses detected.  Musco: Warm bil, no deformities or joint swelling noted.   Neuro: alert, no focal deficits noted.    Skin: Warm, no lesions or rashes        Assessment & Plan:

## 2015-05-08 NOTE — Assessment & Plan Note (Signed)
Probable flare of IPF -steroid responsive  Improved clinically on steroids w/ decompensation off steroids.  No sign of fluid overload on exam  If not improving check BNP-on return-pt declines labs today.  Start Esbriet when available.   Plan  Restart  prednisone taper and hold at prednisone 10mg  daily until back in office. .  Increase Oxygen 3l/ rest and 4l/m w/ activity -use only on continuous setting.  Follow up Dr. Chase Caller in 3-4 weeks and As needed   Please contact office for sooner follow up if symptoms do not improve or worsen or seek emergency care

## 2015-05-09 NOTE — Telephone Encounter (Signed)
lmtcb x1 for Joshua Bullock.

## 2015-05-10 ENCOUNTER — Encounter (HOSPITAL_COMMUNITY): Admission: RE | Admit: 2015-05-10 | Payer: Medicare Other | Source: Ambulatory Visit

## 2015-05-10 NOTE — Telephone Encounter (Signed)
LMTCB for Joshua Bullock 

## 2015-05-11 NOTE — Telephone Encounter (Signed)
Daneil Dan can you help with this? The schedule is not open yet.

## 2015-05-11 NOTE — Telephone Encounter (Signed)
I am opening slots 05/28/15 and 05/29/15 - get him in there please. Take help of [place] [facility]

## 2015-05-11 NOTE — Telephone Encounter (Signed)
Spoke with [employer]. I asked her what the pt's options would be now that he is been denied for financial assistance. She states that we need call and speak to a reimbursement specialist. Her name is Rayshone Pringle >> [phone]. lmtcb x1 for Rayshone.

## 2015-05-11 NOTE — Telephone Encounter (Signed)
lmtcb X3 for Joshua Bullock.

## 2015-05-14 NOTE — Telephone Encounter (Signed)
Called Joshua Bullock and LMTCB

## 2015-05-14 NOTE — Telephone Encounter (Signed)
Schedule is not open yet. Will hold open for when schedule opens

## 2015-05-15 ENCOUNTER — Telehealth (HOSPITAL_COMMUNITY): Payer: Self-pay | Admitting: *Deleted

## 2015-05-15 ENCOUNTER — Encounter (HOSPITAL_COMMUNITY): Payer: Medicare Other

## 2015-05-15 NOTE — Telephone Encounter (Signed)
atc Joshua Bullock, line rang several times, then line went dead.  Wcb.

## 2015-05-16 NOTE — Telephone Encounter (Signed)
Appointment made. Nothing further needed at this time.

## 2015-05-16 NOTE — Telephone Encounter (Signed)
lmtcb for Joshua Bullock.

## 2015-05-17 ENCOUNTER — Encounter (HOSPITAL_COMMUNITY): Admission: RE | Admit: 2015-05-17 | Payer: Medicare Other | Source: Ambulatory Visit

## 2015-05-17 NOTE — Telephone Encounter (Signed)
Attempted to contact Joshua Bullock. No answer x2. Will close message per triage protocol.

## 2015-05-22 ENCOUNTER — Encounter (HOSPITAL_COMMUNITY)
Admission: RE | Admit: 2015-05-22 | Discharge: 2015-05-22 | Disposition: A | Discharge status: Home / Self Care | Payer: Medicare Other | Source: Ambulatory Visit | Attending: Internal Medicine | Admitting: Internal Medicine

## 2015-05-24 ENCOUNTER — Encounter (HOSPITAL_COMMUNITY): Payer: Medicare Other

## 2015-05-26 ENCOUNTER — Inpatient Hospital Stay (HOSPITAL_COMMUNITY)
Admission: EM | Admit: 2015-05-26 | Discharge: 2015-05-29 | DRG: 190 | Disposition: A | Discharge status: Skilled Nursing Facility | Payer: Medicare Other | Attending: Internal Medicine | Admitting: Internal Medicine

## 2015-05-26 ENCOUNTER — Encounter (HOSPITAL_COMMUNITY): Payer: Self-pay | Admitting: Emergency Medicine

## 2015-05-26 ENCOUNTER — Emergency Department (HOSPITAL_COMMUNITY): Payer: Medicare Other

## 2015-05-26 DIAGNOSIS — J9601 Acute respiratory failure with hypoxia: Secondary | ICD-10-CM | POA: Diagnosis present

## 2015-05-26 DIAGNOSIS — R0602 Shortness of breath: Secondary | ICD-10-CM | POA: Diagnosis present

## 2015-05-26 DIAGNOSIS — I5033 Acute on chronic diastolic (congestive) heart failure: Secondary | ICD-10-CM | POA: Diagnosis present

## 2015-05-26 DIAGNOSIS — J8410 Pulmonary fibrosis, unspecified: Secondary | ICD-10-CM

## 2015-05-26 DIAGNOSIS — E871 Hypo-osmolality and hyponatremia: Secondary | ICD-10-CM | POA: Diagnosis present

## 2015-05-26 DIAGNOSIS — I480 Paroxysmal atrial fibrillation: Secondary | ICD-10-CM | POA: Diagnosis present

## 2015-05-26 DIAGNOSIS — J9602 Acute respiratory failure with hypercapnia: Secondary | ICD-10-CM | POA: Diagnosis not present

## 2015-05-26 DIAGNOSIS — J986 Disorders of diaphragm: Secondary | ICD-10-CM | POA: Diagnosis present

## 2015-05-26 DIAGNOSIS — R634 Abnormal weight loss: Secondary | ICD-10-CM | POA: Diagnosis present

## 2015-05-26 DIAGNOSIS — I10 Essential (primary) hypertension: Secondary | ICD-10-CM | POA: Diagnosis present

## 2015-05-26 DIAGNOSIS — J9620 Acute and chronic respiratory failure, unspecified whether with hypoxia or hypercapnia: Secondary | ICD-10-CM | POA: Insufficient documentation

## 2015-05-26 DIAGNOSIS — R5383 Other fatigue: Secondary | ICD-10-CM | POA: Diagnosis not present

## 2015-05-26 DIAGNOSIS — G4730 Sleep apnea, unspecified: Secondary | ICD-10-CM | POA: Diagnosis present

## 2015-05-26 DIAGNOSIS — Z79899 Other long term (current) drug therapy: Secondary | ICD-10-CM | POA: Diagnosis not present

## 2015-05-26 DIAGNOSIS — Z87891 Personal history of nicotine dependence: Secondary | ICD-10-CM | POA: Diagnosis not present

## 2015-05-26 DIAGNOSIS — J189 Pneumonia, unspecified organism: Secondary | ICD-10-CM | POA: Diagnosis present

## 2015-05-26 DIAGNOSIS — J440 Chronic obstructive pulmonary disease with acute lower respiratory infection: Principal | ICD-10-CM | POA: Diagnosis present

## 2015-05-26 DIAGNOSIS — Z7982 Long term (current) use of aspirin: Secondary | ICD-10-CM | POA: Diagnosis not present

## 2015-05-26 DIAGNOSIS — J9621 Acute and chronic respiratory failure with hypoxia: Secondary | ICD-10-CM

## 2015-05-26 DIAGNOSIS — J9622 Acute and chronic respiratory failure with hypercapnia: Secondary | ICD-10-CM | POA: Diagnosis present

## 2015-05-26 DIAGNOSIS — Z7901 Long term (current) use of anticoagulants: Secondary | ICD-10-CM

## 2015-05-26 DIAGNOSIS — J84112 Idiopathic pulmonary fibrosis: Secondary | ICD-10-CM

## 2015-05-26 DIAGNOSIS — E785 Hyperlipidemia, unspecified: Secondary | ICD-10-CM | POA: Diagnosis present

## 2015-05-26 DIAGNOSIS — B370 Candidal stomatitis: Secondary | ICD-10-CM | POA: Diagnosis present

## 2015-05-26 LAB — BASIC METABOLIC PANEL
ANION GAP: 7 (ref 5–15)
BUN: 13 mg/dL (ref 6–20)
CALCIUM: 8.7 mg/dL — AB (ref 8.9–10.3)
CO2: 30 mmol/L (ref 22–32)
Chloride: 92 mmol/L — ABNORMAL LOW (ref 101–111)
Creatinine, Ser: 0.98 mg/dL (ref 0.61–1.24)
Glucose, Bld: 112 mg/dL — ABNORMAL HIGH (ref 65–99)
POTASSIUM: 5 mmol/L (ref 3.5–5.1)
Sodium: 129 mmol/L — ABNORMAL LOW (ref 135–145)

## 2015-05-26 LAB — CBC WITH DIFFERENTIAL/PLATELET
BASOS ABS: 0 10*3/uL (ref 0.0–0.1)
BASOS PCT: 0 %
EOS PCT: 1 %
Eosinophils Absolute: 0.1 10*3/uL (ref 0.0–0.7)
HEMATOCRIT: 45.6 % (ref 39.0–52.0)
Hemoglobin: 15.8 g/dL (ref 13.0–17.0)
Lymphocytes Relative: 9 %
Lymphs Abs: 1.6 10*3/uL (ref 0.7–4.0)
MCH: 31 pg (ref 26.0–34.0)
MCHC: 34.6 g/dL (ref 30.0–36.0)
MCV: 89.4 fL (ref 78.0–100.0)
MONO ABS: 0.8 10*3/uL (ref 0.1–1.0)
Monocytes Relative: 4 %
NEUTROS ABS: 15.3 10*3/uL — AB (ref 1.7–7.7)
Neutrophils Relative %: 86 %
PLATELETS: 301 10*3/uL (ref 150–400)
RBC: 5.1 MIL/uL (ref 4.22–5.81)
RDW: 14.4 % (ref 11.5–15.5)
WBC: 17.8 10*3/uL — AB (ref 4.0–10.5)

## 2015-05-26 LAB — BLOOD GAS, VENOUS
ACID-BASE EXCESS: 4.2 mmol/L — AB (ref 0.0–2.0)
Bicarbonate: 29.3 mEq/L — ABNORMAL HIGH (ref 20.0–24.0)
O2 Content: 4 L/min
O2 SAT: 32.5 %
PATIENT TEMPERATURE: 98.6
TCO2: 25.9 mmol/L (ref 0–100)
pCO2, Ven: 47 mmHg (ref 45.0–50.0)
pH, Ven: 7.411 — ABNORMAL HIGH (ref 7.250–7.300)

## 2015-05-26 LAB — URINALYSIS, ROUTINE W REFLEX MICROSCOPIC
BILIRUBIN URINE: NEGATIVE
Glucose, UA: NEGATIVE mg/dL
Hgb urine dipstick: NEGATIVE
KETONES UR: NEGATIVE mg/dL
Leukocytes, UA: NEGATIVE
NITRITE: NEGATIVE
PROTEIN: NEGATIVE mg/dL
Specific Gravity, Urine: 1.009 (ref 1.005–1.030)
pH: 7 (ref 5.0–8.0)

## 2015-05-26 MED ORDER — ACETAMINOPHEN 325 MG PO TABS: 650.0000 mg | ORAL_TABLET | Freq: Four times a day (QID) | ORAL | Status: DC | PRN

## 2015-05-26 MED ORDER — FUROSEMIDE 10 MG/ML IJ SOLN
40.0000 mg | Freq: Once | INTRAMUSCULAR | Status: AC
  Administered 2015-05-26: 40 mg via INTRAVENOUS
  Filled 2015-05-26: qty 4

## 2015-05-26 MED ORDER — ONDANSETRON HCL 4 MG PO TABS: 4.0000 mg | ORAL_TABLET | Freq: Four times a day (QID) | ORAL | Status: DC | PRN

## 2015-05-26 MED ORDER — SODIUM CHLORIDE 0.9 % IV SOLN
INTRAVENOUS | Status: AC
  Administered 2015-05-26 – 2015-05-28 (×2): via INTRAVENOUS

## 2015-05-26 MED ORDER — ACETAMINOPHEN 650 MG RE SUPP: 650.0000 mg | Freq: Four times a day (QID) | RECTAL | Status: DC | PRN

## 2015-05-26 MED ORDER — MORPHINE SULFATE (PF) 2 MG/ML IV SOLN: 2.0000 mg | INTRAVENOUS | Status: DC | PRN

## 2015-05-26 MED ORDER — SENNOSIDES-DOCUSATE SODIUM 8.6-50 MG PO TABS: 1.0000 | ORAL_TABLET | Freq: Every evening | ORAL | Status: DC | PRN

## 2015-05-26 MED ORDER — LORAZEPAM 2 MG/ML IJ SOLN
0.5000 mg | Freq: Once | INTRAMUSCULAR | Status: AC
  Administered 2015-05-26: 0.5 mg via INTRAVENOUS
  Filled 2015-05-26: qty 1

## 2015-05-26 MED ORDER — PROSIGHT PO TABS
2.0000 | ORAL_TABLET | Freq: Two times a day (BID) | ORAL | Status: DC
  Administered 2015-05-26 – 2015-05-29 (×6): 2 via ORAL
  Filled 2015-05-26 (×7): qty 2

## 2015-05-26 MED ORDER — ZOLPIDEM TARTRATE 5 MG PO TABS: 5.0000 mg | ORAL_TABLET | Freq: Every evening | ORAL | Status: DC | PRN

## 2015-05-26 MED ORDER — IPRATROPIUM BROMIDE 0.02 % IN SOLN
0.5000 mg | Freq: Four times a day (QID) | RESPIRATORY_TRACT | Status: DC
  Administered 2015-05-26: 0.5 mg via RESPIRATORY_TRACT
  Filled 2015-05-26: qty 2.5

## 2015-05-26 MED ORDER — GUAIFENESIN ER 600 MG PO TB12
1200.0000 mg | ORAL_TABLET | Freq: Two times a day (BID) | ORAL | Status: DC
  Administered 2015-05-27 – 2015-05-29 (×5): 1200 mg via ORAL
  Filled 2015-05-26 (×5): qty 2

## 2015-05-26 MED ORDER — METHYLPREDNISOLONE SODIUM SUCC 125 MG IJ SOLR
60.0000 mg | Freq: Four times a day (QID) | INTRAMUSCULAR | Status: DC
  Administered 2015-05-26 – 2015-05-29 (×10): 60 mg via INTRAVENOUS
  Filled 2015-05-26 (×10): qty 2

## 2015-05-26 MED ORDER — IPRATROPIUM-ALBUTEROL 0.5-2.5 (3) MG/3ML IN SOLN
3.0000 mL | Freq: Once | RESPIRATORY_TRACT | Status: AC
  Administered 2015-05-26: 3 mL via RESPIRATORY_TRACT
  Filled 2015-05-26: qty 3

## 2015-05-26 MED ORDER — LEVOFLOXACIN IN D5W 750 MG/150ML IV SOLN
750.0000 mg | Freq: Once | INTRAVENOUS | Status: AC
  Administered 2015-05-26: 750 mg via INTRAVENOUS
  Filled 2015-05-26: qty 150

## 2015-05-26 MED ORDER — LEVOFLOXACIN IN D5W 750 MG/150ML IV SOLN
750.0000 mg | INTRAVENOUS | Status: DC
  Administered 2015-05-27 – 2015-05-28 (×2): 750 mg via INTRAVENOUS
  Filled 2015-05-26 (×2): qty 150

## 2015-05-26 MED ORDER — AZELASTINE HCL 0.1 % NA SOLN
2.0000 | Freq: Two times a day (BID) | NASAL | Status: DC
  Administered 2015-05-27 – 2015-05-29 (×5): 2 via NASAL
  Filled 2015-05-26: qty 30

## 2015-05-26 MED ORDER — SODIUM CHLORIDE 0.9 % IV BOLUS (SEPSIS)
1000.0000 mL | Freq: Once | INTRAVENOUS | Status: AC
  Administered 2015-05-26: 1000 mL via INTRAVENOUS

## 2015-05-26 MED ORDER — ONDANSETRON HCL 4 MG/2ML IJ SOLN: 4.0000 mg | Freq: Four times a day (QID) | INTRAMUSCULAR | Status: DC | PRN

## 2015-05-26 MED ORDER — ALBUTEROL SULFATE (2.5 MG/3ML) 0.083% IN NEBU
5.0000 mg | INHALATION_SOLUTION | Freq: Once | RESPIRATORY_TRACT | Status: AC
  Administered 2015-05-26: 5 mg via RESPIRATORY_TRACT
  Filled 2015-05-26: qty 6

## 2015-05-26 MED ORDER — ASPIRIN EC 81 MG PO TBEC
81.0000 mg | DELAYED_RELEASE_TABLET | Freq: Every day | ORAL | Status: DC
  Administered 2015-05-27 – 2015-05-29 (×3): 81 mg via ORAL
  Filled 2015-05-26 (×3): qty 1

## 2015-05-26 MED ORDER — ALBUTEROL SULFATE (2.5 MG/3ML) 0.083% IN NEBU
2.5000 mg | INHALATION_SOLUTION | RESPIRATORY_TRACT | Status: DC | PRN
  Administered 2015-05-26: 2.5 mg via RESPIRATORY_TRACT
  Filled 2015-05-26: qty 3

## 2015-05-26 MED ORDER — PRAVASTATIN SODIUM 40 MG PO TABS
40.0000 mg | ORAL_TABLET | Freq: Every day | ORAL | Status: DC
  Administered 2015-05-27 – 2015-05-29 (×3): 40 mg via ORAL
  Filled 2015-05-26 (×3): qty 1

## 2015-05-26 MED ORDER — SODIUM CHLORIDE 0.9% FLUSH
3.0000 mL | Freq: Two times a day (BID) | INTRAVENOUS | Status: DC
  Administered 2015-05-28 (×2): 3 mL via INTRAVENOUS

## 2015-05-26 MED ORDER — IPRATROPIUM-ALBUTEROL 0.5-2.5 (3) MG/3ML IN SOLN
3.0000 mL | RESPIRATORY_TRACT | Status: DC
  Administered 2015-05-26 – 2015-05-28 (×12): 3 mL via RESPIRATORY_TRACT
  Filled 2015-05-26 (×12): qty 3

## 2015-05-26 MED ORDER — APIXABAN 5 MG PO TABS
5.0000 mg | ORAL_TABLET | Freq: Two times a day (BID) | ORAL | Status: DC
  Administered 2015-05-26 – 2015-05-29 (×6): 5 mg via ORAL
  Filled 2015-05-26 (×6): qty 1

## 2015-05-26 MED ORDER — PREDNISONE 50 MG PO TABS
50.0000 mg | ORAL_TABLET | Freq: Once | ORAL | Status: AC
  Administered 2015-05-26: 50 mg via ORAL
  Filled 2015-05-26: qty 1

## 2015-05-26 MED ORDER — AMLODIPINE BESYLATE 5 MG PO TABS
5.0000 mg | ORAL_TABLET | Freq: Every day | ORAL | Status: DC
  Administered 2015-05-27 – 2015-05-29 (×3): 5 mg via ORAL
  Filled 2015-05-26 (×3): qty 1

## 2015-05-26 MED ORDER — PANTOPRAZOLE SODIUM 40 MG PO TBEC
40.0000 mg | DELAYED_RELEASE_TABLET | Freq: Every day | ORAL | Status: DC
  Administered 2015-05-27 – 2015-05-29 (×3): 40 mg via ORAL
  Filled 2015-05-26 (×3): qty 1

## 2015-05-26 MED ORDER — MOMETASONE FURO-FORMOTEROL FUM 200-5 MCG/ACT IN AERO
2.0000 | INHALATION_SPRAY | Freq: Two times a day (BID) | RESPIRATORY_TRACT | Status: DC
Start: 2015-05-26 — End: 2015-05-29
  Administered 2015-05-27 – 2015-05-29 (×5): 2 via RESPIRATORY_TRACT
  Filled 2015-05-26: qty 8.8

## 2015-05-26 NOTE — ED Notes (Signed)
Bed: WA09 Expected date: 05/26/15 Expected time: 1:47 PM Means of arrival: Ambulance Comments: Resp diff x 3 days

## 2015-05-26 NOTE — ED Notes (Signed)
PT CAN GO TO FLOOR AT 1820 

## 2015-05-26 NOTE — ED Notes (Addendum)
Pt arrived via EMS with report of resp distress x3 days with dry cough. Pt denies fever and chest pain. EMS reported sats at 80%RA and pt breathing in tripod position started on NRB, auscultated diminished breath sounds in lower lobes and rhonci with slight wheezing in upper lobes. Given Albuterol 5mg  enroute.

## 2015-05-26 NOTE — ED Notes (Addendum)
Pt noted to desat to 81% with small amt of exertion. Pt reported that he wears O2 at 3lpm at night and 4-6lpm during the day.

## 2015-05-26 NOTE — H&P (Addendum)
Patient Demographics  Joshua Bullock, is a 82 y.o. male  MRN: [id number]   DOB - Jul 24, 1932  Admit Date - 05/26/2015  Outpatient Primary MD for the patient is [name], MD   Assessment Joshua Bullock  is a pleasant 82 y.o. male with severe Idiopathic pulmonary fibrosis(On Esberiet started 3 weeks ago) causing chronic hypoxic/hypercapneic respiratory failure on supplementary oxygen , who comes in with worsening shortness of breath, and not feeling well, associated with hypoxia(O2 sat dropped below 88% this morning) , and he is found to have Acute exacerbation of Interstitial Lung disease causing acute on chronic hypercapnic, hypoxic respiratory failure likely related to superimposed pneumonia. Mr Winkfield was noted to have low grade fever of 2F and a white count of 17.800(on low dose prednisone but wbc higher than baseline) in the ED. VBG showed a normal ph. Sodium low at 129.  His CXR showed "Worsening extensive bilateral coarse reticular opacities worrisome for progression of patient's underlying known interstitial lung disease, though note, superimposed infection (including atypical etiologies) is not excluded on the basis of this examination. Clinical correlation is advised". Patient suggests symptoms started this morning when his wife noticed that he was not looking good hence calling 911. He was given prednisone 50mg , oxygen, Levaquin in the ED and referred for admission. He feels better. Pccm was consulted by the ED and they will follow in consult(primary pulmonologist Dr Chase Caller). Mr Boulais will be admitted for further management including systemic steroids/bronchodilators/oxygen supplementation, and empiric antibiotics. He is full code. Plan Acute respiratory failure with hypoxia and hypercarbia (HCC)/ IPF (idiopathic pulmonary fibrosis) (HCC)  Admit telemetry  Levaquin/Solumedrol/bronchodilators/O2 supplementation  Follow pulmonary recommendations, appreciate help. Per  ED, pulmonary recommended to hold Esberiet.  Hyponatremia  Likely related to ongoing lung issues  Monitor for now Hyperlipidemia  Continue statin PAF on Elquis  Monitor   DVT/GI Prophylaxis  On Eliquis  Ppi Family Communication: Admission, patients condition and plan of care including tests being ordered have been discussed with the patient who indicates understanding and agree with the plan and Code Status. Code Status    Likely DC  Home  Condition GUARDED    Time spent in minutes : 48  Chief Complaint Chief Complaint  Patient presents with  . Shortness of Breath     HPI O2 dropped to below 88% while patient was watching TV this morning. His wife noticed he wasn't dioing too good as he had blue finger nails. She called 911. Patient says he was doing relatively ok till this morning. He denies sick contacts. No fever or chills. No chest pain, dysuria, diarrhea or vomiting.   Review of Systems   As in the HPI above.   Past Medical History  Diagnosis Date  . Hyperlipidemia   . Arthritis   . COPD (chronic obstructive pulmonary disease) ([place]) 05/09/2011    Former smoker, fine crackles at bases on exam. PFTs 2012 documented mild-moderate dz    . Hypogonadism male 09/01/2012  . DECREASED HEARING 11/05/2009    Qualifier: Diagnosis of  By: Marijean Niemann CMA, Danielle    . OSTEOPENIA 11/15/2007    Per DEXA 2008    . HYPERGLYCEMIA 05/17/2008    Qualifier: Diagnosis of  By: Larose Kells MD, Alda Berthold Sleep apnea       Past Surgical History  Procedure Laterality Date  . Tonsillectomy  1955  . Eye surgery Bilateral 2016    Cataract  . Rotator cuff repair  right    Social History Social History  Substance Use Topics  . Smoking status: Former Smoker -- 0.75 packs/day for 51 years    Types: Cigarettes    Quit date: 03/24/1970  . Smokeless tobacco: Former Systems developer    Quit date: 03/23/2007     Comment: quit 2009  . Alcohol Use: 0.0 oz/week    0 Standard drinks or equivalent per  week     Comment: 1-2 a month wine or beer    Family History Family History  Problem Relation Age of Onset  . Prostate cancer Neg Hx   . Colon cancer Neg Hx   . Hypertension Father   . Arthritis Sister   . Breast cancer Mother   . Hypertension Sister   . CAD Neg Hx     Prior to Admission medications   Medication Sig Start Date End Date Taking? Authorizing Provider  amLODipine (NORVASC) 5 MG tablet Take 1 tablet (5 mg total) by mouth daily. 06/25/14  Yes Almyra Deforest, PA  apixaban (ELIQUIS) 5 MG TABS tablet Take 1 tablet (5 mg total) by mouth 2 (two) times daily. 01/24/15  Yes Pixie Casino, MD  aspirin EC 81 MG tablet Take 81 mg by mouth daily.   Yes Historical Provider, MD  azelastine (ASTELIN) 0.1 % nasal spray Place 2 sprays into both nostrils 2 (two) times daily. 12/25/14  Yes Colon Branch, MD  diltiazem (CARDIZEM) 30 MG tablet Take 1 tablet every 4-6 hours AS NEEDED for fast heart rate. 08/30/14  Yes Colon Branch, MD  Fluticasone-Salmeterol (ADVAIR) 250-50 MCG/DOSE AEPB Inhale 1 puff into the lungs every 12 (twelve) hours. 01/08/15  Yes Noralee Space, MD  hydrocortisone cream 1 % Apply 1 application topically 2 (two) times daily as needed for itching.    Yes Historical Provider, MD  Multiple Vitamins-Minerals (PRESERVISION AREDS 2) CAPS Take 2 capsules by mouth 2 (two) times daily.   Yes Historical Provider, MD  OXYGEN Inhale 30-40 L into the lungs 2 (two) times daily. 30 liters in the morning and 40 liters at bedtime   Yes Historical Provider, MD  Pirfenidone (ESBRIET) 267 MG CAPS Take 801 mg by mouth 3 (three) times daily. Day 1-7 take 1 capsule TID, 8-14 take 2 capsules TID, Day 15 -onward take 3 capsules TID   Yes Historical Provider, MD  pravastatin (PRAVACHOL) 40 MG tablet Take 1 tablet (40 mg total) by mouth daily. 03/20/15  Yes Colon Branch, MD  predniSONE (DELTASONE) 10 MG tablet 4 tabs for 2 days, then 3 tabs for 2 days, 2 tabs for 2 days, then 1 tab daily . Patient taking differently:  Take 10 mg by mouth daily with breakfast.  05/08/15  Yes Tammy S Parrett, NP  Testosterone (ANDROGEL PUMP) 20.25 MG/ACT (1.62%) GEL USE 5 PUMPS ON THE SKIN EVERY DAY 03/14/15  Yes Renato Shin, MD  citalopram (CELEXA) 20 MG tablet Take 1 tablet (20 mg total) by mouth daily. Patient not taking: Reported on 05/26/2015 02/19/15   Colon Branch, MD    Allergies  Allergen Reactions  . Influenza Virus Vacc Split Pf Other (See Comments)    Hospitalized with temperatures above 103 and classic symptoms of flu  . Tetanus Toxoid Hives    Physical Exam  Vitals  Blood pressure 121/63, pulse 88, temperature 98.2 F (36.8 C), temperature source Axillary, resp. rate 30, height 5' 10.5" (1.791 m), weight 75.8 kg (167 lb 1.7 oz), SpO2 94 %.   General:  Mild respiratory distress.  Cardiovascular: S1-S2 normal. No murmurs. Pulse regular.  Respiratory: Good air entry bilaterally. Coarse rhonchi but no rales or wheezes.  Abdomen: Soft and nontender. Normal bowel sounds. No organomegaly.  Musculoskeletal: No pedal edema   Neurological: Intact  Data Review CBC  Recent Labs Lab 05/26/15 1500  WBC 17.8*  HGB 15.8  HCT 45.6  PLT 301  MCV 89.4  MCH 31.0  MCHC 34.6  RDW 14.4  LYMPHSABS 1.6  MONOABS 0.8  EOSABS 0.1  BASOSABS 0.0   ------------------------------------------------------------------------------------------------------------------  Chemistries   Recent Labs Lab 05/26/15 1500  NA 129*  K 5.0  CL 92*  CO2 30  GLUCOSE 112*  BUN 13  CREATININE 0.98  CALCIUM 8.7*   ------------------------------------------------------------------------------------------------------------------ estimated creatinine clearance is 63.1 mL/min (by C-G formula based on Cr of 0.98). ------------------------------------------------------------------------------------------------------------------ No results for input(s): TSH, T4TOTAL, T3FREE, THYROIDAB in the last 72 hours.  Invalid input(s):  FREET3   Coagulation profile No results for input(s): INR, PROTIME in the last 168 hours. ------------------------------------------------------------------------------------------------------------------- No results for input(s): DDIMER in the last 72 hours. -------------------------------------------------------------------------------------------------------------------  Cardiac Enzymes No results for input(s): CKMB, TROPONINI, MYOGLOBIN in the last 168 hours.  Invalid input(s): CK ------------------------------------------------------------------------------------------------------------------ Invalid input(s): POCBNP   ---------------------------------------------------------------------------------------------------------------  Urinalysis    Component Value Date/Time   COLORURINE YELLOW 05/26/2015 1803   APPEARANCEUR CLOUDY* 05/26/2015 1803   LABSPEC 1.009 05/26/2015 1803   PHURINE 7.0 05/26/2015 1803   GLUCOSEU NEGATIVE 05/26/2015 1803   HGBUR NEGATIVE 05/26/2015 1803   BILIRUBINUR NEGATIVE 05/26/2015 1803   KETONESUR NEGATIVE 05/26/2015 1803   PROTEINUR NEGATIVE 05/26/2015 1803   UROBILINOGEN 0.2 06/23/2014 1650   NITRITE NEGATIVE 05/26/2015 1803   LEUKOCYTESUR NEGATIVE 05/26/2015 1803    ----------------------------------------------------------------------------------------------------------------  Imaging results  Dg Chest 2 View  05/26/2015  CLINICAL DATA:  Shortness of breath and cough today. History of COPD. Former smoker. EXAM: CHEST  2 VIEW COMPARISON:  04/20/2015; 02/12/2015; 06/25/2014; chest CT - 01/15/2015 FINDINGS: Grossly unchanged cardiac silhouette and mediastinal contours given persistently reduced lung volumes. Extensive bilateral now slightly coarsened reticular airspace opacities have progressed since the 03/2018 09/2015 examination. No definite pleural effusion. No pneumothorax. No acute osseous abnormalities. IMPRESSION: Worsening extensive  bilateral coarse reticular opacities worrisome for progression of patient's underlying known interstitial lung disease, though note, superimposed infection (including atypical etiologies) is not excluded on the basis of this examination. Clinical correlation is advised. Electronically Signed   By: Sandi Mariscal M.D.   On: 05/26/2015 14:39        Lauraine Crespo M.D on 05/26/2015 at 10:49 PM  Between 7am to 7pm - Pager - [phone]  After 7pm go to [email or web address] - password [email or web address]  And look for the night coverage person covering me after hours  [facility] Office  [phone]

## 2015-05-26 NOTE — Progress Notes (Signed)
Patient has increased work of breathing. Patient is constantly twitching,and using pursed lips to breathe.  PCP was notified.

## 2015-05-26 NOTE — ED Notes (Signed)
Patient transported to X-ray 

## 2015-05-26 NOTE — Progress Notes (Addendum)
Rapid Response Team and MD notified on increased WOB.

## 2015-05-26 NOTE — ED Provider Notes (Signed)
CSN: [id number]     Arrival date & time 05/26/15  1346 History   First MD Initiated Contact with Patient 05/26/15 1512     Chief Complaint  Patient presents with  . Shortness of Breath     (Consider location/radiation/quality/duration/timing/severity/associated sxs/prior Treatment) Patient is a 82 y.o. male presenting with shortness of breath. The history is provided by the patient.  Shortness of Breath Severity:  Moderate Onset quality:  Gradual Duration:  2 weeks Timing:  Constant Progression:  Worsening Context: activity   Context: not URI   Context comment:  Medication dosing escalation Relieved by:  Oxygen Exacerbated by: went to 801 mg dose Esbreit 4 days ago and has worsened since. Ineffective treatments:  Rest Associated symptoms: cough (chronic)   Associated symptoms: no abdominal pain, no fever, no hemoptysis, no sputum production and no vomiting   Associated symptoms comment:  Loss of energy, lack of appetite, increased work of breathing Risk factors comment:  Severe idiopathic interstitial lung disease   Past Medical History  Diagnosis Date  . Hyperlipidemia   . Arthritis   . COPD (chronic obstructive pulmonary disease) ([place]) 05/09/2011    Former smoker, fine crackles at bases on exam. PFTs 2012 documented mild-moderate dz    . Hypogonadism male 09/01/2012  . DECREASED HEARING 11/05/2009    Qualifier: Diagnosis of  By: Marijean Niemann CMA, Danielle    . OSTEOPENIA 11/15/2007    Per DEXA 2008    . HYPERGLYCEMIA 05/17/2008    Qualifier: Diagnosis of  By: Larose Kells MD, Alda Berthold Sleep apnea    Past Surgical History  Procedure Laterality Date  . Tonsillectomy  1955  . Eye surgery Bilateral 2016    Cataract  . Rotator cuff repair      right   Family History  Problem Relation Age of Onset  . Prostate cancer Neg Hx   . Colon cancer Neg Hx   . Hypertension Father   . Arthritis Sister   . Breast cancer Mother   . Hypertension Sister   . CAD Neg Hx    Social History   Substance Use Topics  . Smoking status: Former Smoker -- 0.75 packs/day for 51 years    Types: Cigarettes    Quit date: 03/24/1970  . Smokeless tobacco: Former Systems developer    Quit date: 03/23/2007     Comment: quit 2009  . Alcohol Use: 0.0 oz/week    0 Standard drinks or equivalent per week     Comment: 1-2 a month wine or beer    Review of Systems  Constitutional: Negative for fever.  Respiratory: Positive for cough (chronic) and shortness of breath. Negative for hemoptysis and sputum production.   Gastrointestinal: Negative for vomiting and abdominal pain.  All other systems reviewed and are negative.     Allergies  Influenza virus vacc split pf and Tetanus toxoid  Home Medications   Prior to Admission medications   Medication Sig Start Date End Date Taking? Authorizing Provider  amLODipine (NORVASC) 5 MG tablet Take 1 tablet (5 mg total) by mouth daily. 06/25/14  Yes Almyra Deforest, PA  apixaban (ELIQUIS) 5 MG TABS tablet Take 1 tablet (5 mg total) by mouth 2 (two) times daily. 01/24/15  Yes Pixie Casino, MD  aspirin EC 81 MG tablet Take 81 mg by mouth daily.   Yes Historical Provider, MD  azelastine (ASTELIN) 0.1 % nasal spray Place 2 sprays into both nostrils 2 (two) times daily. 12/25/14  Yes Colon Branch,  MD  diltiazem (CARDIZEM) 30 MG tablet Take 1 tablet every 4-6 hours AS NEEDED for fast heart rate. 08/30/14  Yes Colon Branch, MD  Fluticasone-Salmeterol (ADVAIR) 250-50 MCG/DOSE AEPB Inhale 1 puff into the lungs every 12 (twelve) hours. 01/08/15  Yes Noralee Space, MD  hydrocortisone cream 1 % Apply 1 application topically 2 (two) times daily as needed for itching.    Yes Historical Provider, MD  Multiple Vitamins-Minerals (PRESERVISION AREDS 2) CAPS Take 2 capsules by mouth 2 (two) times daily.   Yes Historical Provider, MD  OXYGEN Inhale 30-40 L into the lungs 2 (two) times daily. 30 liters in the morning and 40 liters at bedtime   Yes Historical Provider, MD  Pirfenidone (ESBRIET) 267  MG CAPS Take 801 mg by mouth 3 (three) times daily. Day 1-7 take 1 capsule TID, 8-14 take 2 capsules TID, Day 15 -onward take 3 capsules TID   Yes Historical Provider, MD  pravastatin (PRAVACHOL) 40 MG tablet Take 1 tablet (40 mg total) by mouth daily. 03/20/15  Yes Colon Branch, MD  predniSONE (DELTASONE) 10 MG tablet 4 tabs for 2 days, then 3 tabs for 2 days, 2 tabs for 2 days, then 1 tab daily . Patient taking differently: Take 10 mg by mouth daily with breakfast.  05/08/15  Yes Tammy S Parrett, NP  Testosterone (ANDROGEL PUMP) 20.25 MG/ACT (1.62%) GEL USE 5 PUMPS ON THE SKIN EVERY DAY 03/14/15  Yes Renato Shin, MD  citalopram (CELEXA) 20 MG tablet Take 1 tablet (20 mg total) by mouth daily. Patient not taking: Reported on 05/26/2015 02/19/15   Colon Branch, MD   BP 112/85 mmHg  Pulse 51  Resp 24  SpO2 90% Physical Exam  Constitutional: He is oriented to person, place, and time. He appears well-developed. He appears ill. He appears distressed. Face mask in place.  HENT:  Head: Normocephalic and atraumatic.  Eyes: Conjunctivae are normal.  Neck: Neck supple. No tracheal deviation present.  Cardiovascular: Normal rate and regular rhythm.   Pulmonary/Chest: Accessory muscle usage present. Tachypnea noted. He is in respiratory distress. He has decreased breath sounds. He has rales (diffuse coarse).  Abdominal: Soft. He exhibits no distension.  Neurological: He is alert and oriented to person, place, and time.  Skin: Skin is warm and dry.  Psychiatric: He has a normal mood and affect.    ED Course  Procedures (including critical care time)  CRITICAL CARE Performed by: Leo Grosser Total critical care time: 30 minutes Critical care time was exclusive of separately billable procedures and treating other patients. Critical care was necessary to treat or prevent imminent or life-threatening deterioration. Critical care was time spent personally by me on the following activities: development of  treatment plan with patient and/or surrogate as well as nursing, discussions with consultants, evaluation of patient's response to treatment, examination of patient, obtaining history from patient or surrogate, ordering and performing treatments and interventions, ordering and review of laboratory studies, ordering and review of radiographic studies, pulse oximetry and re-evaluation of patient's condition.   Labs Review Labs Reviewed  CBC WITH DIFFERENTIAL/PLATELET - Abnormal; Notable for the following:    WBC 17.8 (*)    Neutro Abs 15.3 (*)    All other components within normal limits  BASIC METABOLIC PANEL - Abnormal; Notable for the following:    Sodium 129 (*)    Chloride 92 (*)    Glucose, Bld 112 (*)    Calcium 8.7 (*)    All  other components within normal limits  BLOOD GAS, VENOUS - Abnormal; Notable for the following:    pH, Ven 7.411 (*)    Bicarbonate 29.3 (*)    Acid-Base Excess 4.2 (*)    All other components within normal limits  CULTURE, BLOOD (ROUTINE X 2)  CULTURE, BLOOD (ROUTINE X 2)  URINE CULTURE  URINALYSIS, ROUTINE W REFLEX MICROSCOPIC (NOT AT [facility])    Imaging Review Dg Chest 2 View  05/26/2015  CLINICAL DATA:  Shortness of breath and cough today. History of COPD. Former smoker. EXAM: CHEST  2 VIEW COMPARISON:  04/20/2015; 02/12/2015; 06/25/2014; chest CT - 01/15/2015 FINDINGS: Grossly unchanged cardiac silhouette and mediastinal contours given persistently reduced lung volumes. Extensive bilateral now slightly coarsened reticular airspace opacities have progressed since the 03/2018 09/2015 examination. No definite pleural effusion. No pneumothorax. No acute osseous abnormalities. IMPRESSION: Worsening extensive bilateral coarse reticular opacities worrisome for progression of patient's underlying known interstitial lung disease, though note, superimposed infection (including atypical etiologies) is not excluded on the basis of this examination. Clinical correlation is  advised. Electronically Signed   By: Sandi Mariscal M.D.   On: 05/26/2015 14:39   I have personally reviewed and evaluated these images and lab results as part of my medical decision-making.   EKG Interpretation   Date/Time:  Saturday May 26 2015 15:33:24 EST Ventricular Rate:  87 PR Interval:  167 QRS Duration: 96 QT Interval:  374 QTC Calculation: 450 R Axis:   85 Text Interpretation:  Sinus rhythm Consider right ventricular hypertrophy  Borderline T abnormalities, anterior leads No significant change since  last tracing Confirmed by Casmer Yepiz MD, Quillian Quince [id number]) on 05/26/2015 3:35:41 PM      MDM   Final diagnoses:  IPF (idiopathic pulmonary fibrosis) (HCC)  Acute on chronic respiratory failure with hypoxia ([place])    82 y.o. male presents with Progressively increasing shortness of breath over the last 2 weeks. He is on Esbreit therapy to help slow progression of idiopathic pulmonary fibrosis and recently increased his dose 4 days ago to 3 tablets daily. Since that time he has had an acute decompensation of his chronic respiratory failure, has had increasing dyspnea, and has been saturating in the low 80s at home. On arrival here he is placed on facemask by nursing prior to my evaluation was saturating in the high 90s which is far above his baseline. He was titrated immediately down to 4 L at rest and was maintaining 88-92% which is his baseline.   He normally has 3 L at rest and 4 L with activity, when he stands up to urinate or performs minimal activity on 4L he desaturates to the high 70s and low 80s.  He has diffuse rales and worsening her social infiltration on his x-ray compared to prior. No fevers, no productive sputum, no evidence of atypical infection or pneumonia currently. I suspect this is progression of his idiopathic lung disease. Has been steroid responsive in the past, is currently on chronic prednisone therapy which may be the cause of his elevated white blood cell count.  Added 50 mg of prednisone to his home dose of 10 to help with primary function. Due to acute decompensation over the last week and will recommend admission for further management.  I discussed this case with pulmonology who he sees in clinic and they did not recommend decreasing Esbreit. Agree with increasing steroids for now and will see the patient after he is admitted in consultation. Hospitalist was consulted for admission and will  see the patient in the emergency department.   Hospitalist requesting atypical coverage and cultures which isvery reasonable given elevation in WBC from baseline. Started on levaquin. Admit to tele  Leo Grosser, MD 05/26/15 1745

## 2015-05-26 NOTE — Progress Notes (Signed)
At about 2036 Rapid response call was received regarding patient's increased work of breathing and shortness of breath.  On arrival to room 1438, patient was in bed, in obvious respiratory distress, he was receiving a breathing treatment. Lung auscultation revealed diminished breath sounds with what sound like friction rub. Pt was alert and oriented x 4, vital signs; BP 121/63, HR 85, RR 28, O2 stas 92% on 5L. [facility] was called to bedside, pt was given a dose of steroid and Lasix as ordered by the physician. Bedside nurse was asked to call rapid response nurse back if patient's condition does not get better. Time spent on this call was 20 minutes.

## 2015-05-26 NOTE — Progress Notes (Signed)
Patientt has increased work of breathing.Using pursed lips. crackles in the bases. PCP was notified.  Rapid Response was notified.

## 2015-05-27 DIAGNOSIS — E785 Hyperlipidemia, unspecified: Secondary | ICD-10-CM

## 2015-05-27 DIAGNOSIS — J84112 Idiopathic pulmonary fibrosis: Secondary | ICD-10-CM

## 2015-05-27 LAB — PROTIME-INR
INR: 1.41 (ref 0.00–1.49)
Prothrombin Time: 17.4 seconds — ABNORMAL HIGH (ref 11.6–15.2)

## 2015-05-27 LAB — MAGNESIUM: Magnesium: 2.4 mg/dL (ref 1.7–2.4)

## 2015-05-27 LAB — CBC WITH DIFFERENTIAL/PLATELET
Basophils Absolute: 0 10*3/uL (ref 0.0–0.1)
Basophils Relative: 0 %
EOS PCT: 0 %
Eosinophils Absolute: 0 10*3/uL (ref 0.0–0.7)
HCT: 45.3 % (ref 39.0–52.0)
Hemoglobin: 15 g/dL (ref 13.0–17.0)
LYMPHS ABS: 1.8 10*3/uL (ref 0.7–4.0)
LYMPHS PCT: 11 %
MCH: 29.9 pg (ref 26.0–34.0)
MCHC: 33.1 g/dL (ref 30.0–36.0)
MCV: 90.4 fL (ref 78.0–100.0)
MONO ABS: 0.4 10*3/uL (ref 0.1–1.0)
Monocytes Relative: 2 %
Neutro Abs: 13.9 10*3/uL — ABNORMAL HIGH (ref 1.7–7.7)
Neutrophils Relative %: 86 %
PLATELETS: 288 10*3/uL (ref 150–400)
RBC: 5.01 MIL/uL (ref 4.22–5.81)
RDW: 14.8 % (ref 11.5–15.5)
WBC: 16.1 10*3/uL — ABNORMAL HIGH (ref 4.0–10.5)

## 2015-05-27 LAB — PHOSPHORUS: PHOSPHORUS: 4.3 mg/dL (ref 2.5–4.6)

## 2015-05-27 LAB — APTT: APTT: 43 s — AB (ref 24–37)

## 2015-05-27 LAB — TSH: TSH: 0.509 u[IU]/mL (ref 0.350–4.500)

## 2015-05-27 MED ORDER — FUROSEMIDE 10 MG/ML IJ SOLN
40.0000 mg | Freq: Every day | INTRAMUSCULAR | Status: DC
  Administered 2015-05-27 – 2015-05-28 (×2): 40 mg via INTRAVENOUS
  Filled 2015-05-27 (×2): qty 4

## 2015-05-27 NOTE — Evaluation (Signed)
Physical Therapy Evaluation Patient Details Name: Joshua Bullock MRN: [id number] DOB: 07-14-1932 Today's Date: 05/27/2015   History of Present Illness  Joshua Bullock is a pleasant 82 y.o. male with severe Idiopathic pulmonary fibrosis(On [place] started 3 weeks ago) causing chronic hypoxic/hypercapneic respiratory failure on supplementary oxygen , who comes in with worsening shortness of breath, and not feeling well, associated with hypoxia , and he is found to have Acute exacerbation of Interstitial Lung disease causing acute on chronic hypercapnic, hypoxic respiratory failure likely related to superimposed pneumonia  Clinical Impression  The patient tolerated sitting on bed edge less than 1 minute with 4 liters O2, stas >95% but much  SOB. Noted extraneous movements of limbs and trunk. Patient  Will benefit from PT while in acute care to  Improve in  Tolerance to activity to DC to next venue vs, Home.    Follow Up Recommendations SNF;Supervision/Assistance - 24 hour;Home health PT (depends on progress and ability to ambulate.)    Equipment Recommendations  None recommended by PT    Recommendations for Other Services       Precautions / Restrictions Precautions Precaution Comments: very DOE, on 4 liters      Mobility  Bed Mobility Overal bed mobility: Needs Assistance Bed Mobility: Supine to Sit;Sit to Supine     Supine to sit: Min guard Sit to supine: Min guard   General bed mobility comments: cues for safety  Transfers                 General transfer comment: NT as patient with increased SOB/WOB in sitting. sat less than 1 minute  Ambulation/Gait                Stairs            Wheelchair Mobility    Modified Rankin (Stroke Patients Only)       Balance Overall balance assessment: Needs assistance Sitting-balance support: Feet supported;Bilateral upper extremity supported Sitting balance-Leahy Scale: Poor                                        Pertinent Vitals/Pain Pain Assessment: No/denies pain    Home Living Family/patient expects to be discharged to:: Private residence Living Arrangements: Spouse/significant other Available Help at Discharge: Family Type of Home: House Home Access: Stairs to enter   Technical brewer of Steps: 2 Home Layout: One level Home Equipment: Environmental consultant - 2 wheels      Prior Function Level of Independence: Needs assistance   Gait / Transfers Assistance Needed: with RW, oxygen           Hand Dominance        Extremity/Trunk Assessment   Upper Extremity Assessment: RUE deficits/detail;LUE deficits/detail RUE Deficits / Details: extraneous  arm movements, athetoid/writhing     LUE Deficits / Details: same as R   Lower Extremity Assessment: RLE deficits/detail;LLE deficits/detail RLE Deficits / Details: similar movements as UE's LLE Deficits / Details: same  Cervical / Trunk Assessment: Other exceptions  Communication      Cognition Arousal/Alertness: Awake/alert Behavior During Therapy: WFL for tasks assessed/performed Overall Cognitive Status: Within Functional Limits for tasks assessed                      General Comments      Exercises        Assessment/Plan    PT  Assessment Patient needs continued PT services  PT Diagnosis Difficulty walking   PT Problem List Decreased range of motion;Decreased activity tolerance;Decreased mobility;Cardiopulmonary status limiting activity  PT Treatment Interventions DME instruction;Gait training;Functional mobility training;Therapeutic activities;Therapeutic exercise;Wheelchair mobility training;Patient/family education   PT Goals (Current goals can be found in the Care Plan section) Acute Rehab PT Goals Patient Stated Goal: to get  up and walk PT Goal Formulation: With patient/family Time For Goal Achievement: 06/19/2015 Potential to Achieve Goals: Fair    Frequency Min 3X/week    Barriers to discharge        Co-evaluation               End of Session   Activity Tolerance: Treatment limited secondary to medical complications (Comment) Patient left: in bed;with call bell/phone within reach;with bed alarm set;with family/visitor present Nurse Communication: Mobility status         Time: [id number] PT Time Calculation (min) (ACUTE ONLY): 15 min   Charges:   PT Evaluation $PT Eval Low Complexity: 1 Procedure     PT G CodesClaretha Cooper 05/27/2015, 3:09 PM Tresa Endo PT [phone]

## 2015-05-27 NOTE — Plan of Care (Signed)
Problem: Education: Goal: Knowledge of [facility] Education information/materials will improve Outcome: Completed/Met Date Met:  05/27/15 Education provided to patient, spouse and son. Questions, concerns denied

## 2015-05-27 NOTE — Consult Note (Signed)
Pulmonary Consult Note  Requesting Provider: Nat Math, MD CC: Dyspnea  Hx: Mr. Gigante is a pt of Dr. Golden Pop with known ILD, UIP pattern, most consistent with IPF given otherwise negative w/u, who was recently started on pirfenidione about a month ago who presents to the ED with worsening dyspnea and hypoxia. He was admitted for an exacerbation of his IPF, and developed hypertension and acutely worsening hypoxia.  FamHx: Reviewed.  SocHx: Reviewed  Meds: No current facility-administered medications on file prior to encounter.   Current Outpatient Prescriptions on File Prior to Encounter  Medication Sig Dispense Refill  . amLODipine (NORVASC) 5 MG tablet Take 1 tablet (5 mg total) by mouth daily. 30 tablet 11  . apixaban (ELIQUIS) 5 MG TABS tablet Take 1 tablet (5 mg total) by mouth 2 (two) times daily. 60 tablet 11  . aspirin EC 81 MG tablet Take 81 mg by mouth daily.    Marland Kitchen azelastine (ASTELIN) 0.1 % nasal spray Place 2 sprays into both nostrils 2 (two) times daily. 30 mL 5  . diltiazem (CARDIZEM) 30 MG tablet Take 1 tablet every 4-6 hours AS NEEDED for fast heart rate. 30 tablet 2  . Fluticasone-Salmeterol (ADVAIR) 250-50 MCG/DOSE AEPB Inhale 1 puff into the lungs every 12 (twelve) hours. 60 each 11  . hydrocortisone cream 1 % Apply 1 application topically 2 (two) times daily as needed for itching.     . Multiple Vitamins-Minerals (PRESERVISION AREDS 2) CAPS Take 2 capsules by mouth 2 (two) times daily.    . pravastatin (PRAVACHOL) 40 MG tablet Take 1 tablet (40 mg total) by mouth daily. 90 tablet 0  . predniSONE (DELTASONE) 10 MG tablet 4 tabs for 2 days, then 3 tabs for 2 days, 2 tabs for 2 days, then 1 tab daily . (Patient taking differently: Take 10 mg by mouth daily with breakfast. ) 50 tablet 1  . Testosterone (ANDROGEL PUMP) 20.25 MG/ACT (1.62%) GEL USE 5 PUMPS ON THE SKIN EVERY DAY 225 g 5  . citalopram (CELEXA) 20 MG tablet Take 1 tablet (20 mg total) by mouth daily.  (Patient not taking: Reported on 05/26/2015) 30 tablet 5   Exam: Physical Exam  Constitutional: He is oriented to person, place, and time. He appears well-developed.  HENT:  Head: Normocephalic.  Eyes: Conjunctivae are normal.  Neck: Normal range of motion. No JVD present. No tracheal deviation present.  Cardiovascular: Normal heart sounds.   Pulmonary/Chest: He is in respiratory distress. He has no wheezes. He has rales.  Modestly increased WOB - Notable paradoxical movement of the chest wall - inward displacement of sternum on inhalation along with abdominal protrusion  Abdominal: Soft. Bowel sounds are normal. He exhibits no distension.  Musculoskeletal: He exhibits no edema.  Lymphadenopathy:    He has no cervical adenopathy.  Neurological: He is alert and oriented to person, place, and time.  Hyperactive movements, almost tic-like  Skin: Skin is warm and dry.  Psychiatric: He has a normal mood and affect. His behavior is normal.  Refer to tic-like restless movements   Imaging: Prior Hi-res CT reviewed - consistent with classic UIP pattern. CXR from today showed elevated diaphragm compared to prior, and what appears to be alveolar pattern in addition to prior reticular interstitial pattern of opacities.   A/P: 82 y/o man with IPF with probable pulmonary edema, may also represent exacerbation of IPF.  1) Pulm Edema: Agree with diuresis. Per bedside RN, this has helped signficaintly. 2) ILD, possible exacerbation. Unfortunately, no  real reliable tx options. Steroids as you are giving are often used, although their effectiveness is not proven. Agree with continuing at this point. 3) Possible diaphragmatic paralysis due to progression of ILD. Pleural inflammation can sometimes cause phrenic nerve damage in IPF. Consider sniff test, although this would not change management. Will monitor.  Luz Brazen, MD Pulmonary & Critical Care Medicine May 27, 2015, 3:08 AM

## 2015-05-27 NOTE — Progress Notes (Signed)
Rapid Response RN came to check on the patient. Patient is currently resting. WOB is slightly better than earlier this night. Patient stated that he felt "OK" and was not in any pain.

## 2015-05-27 NOTE — Progress Notes (Signed)
Utilization Review Completed.Joshua Bullock T3/07/2015  

## 2015-05-27 NOTE — Progress Notes (Signed)
Patient Demographics  Joshua Bullock, is a 82 y.o. male, DOB - 11/26/1932, [id number]  Admit date - 05/26/2015   Admitting Physician Nat Math, MD  Outpatient Primary MD for the patient is [name], MD  LOS - 1   Chief Complaint  Patient presents with  . Shortness of Breath         Subjective:   Joshua Bullock today has, No headache, No chest pain, No abdominal pain - Reports cough at baseline, dyspnea with no improvement . Assessment & Plan    Principal Problem:   Acute respiratory failure with hypoxia and hypercarbia (HCC) Active Problems:   Hyperlipidemia   IPF (idiopathic pulmonary fibrosis) (HCC)   Hyponatremia  Acute on chronic hypoxic respiratory failure  - Multifactorial, baseline IPF, with worsening respiratory failure secondary to IPF exacerbation, as well as evidence of pulmonary edema and volume overload and Possible diaphragmatic paralysis due to progression of ILDd  IPF exacerbation  - Pulmonary consult greatly appreciated, continue with IV steroids, continue with IV antibiotics, continue with nebs   Acute on chronic diastolic CHF  - continue with IV Lasix, monitor daily weights, strict ins and outs  Paroxysmal atrial fibrillation - Continue with Eliquis  Hypertension - Continue with amlodipine  Hyperlipidemia - Continue with statin  Code Status: Full  Family Communication: wife and son at bedside  Disposition Plan: SNF   Procedures  none   Consults PCCM   Medications  Scheduled Meds: . amLODipine  5 mg Oral Daily  . apixaban  5 mg Oral BID WC  . aspirin EC  81 mg Oral Daily  . azelastine  2 spray Each Nare BID  . guaiFENesin  1,200 mg Oral BID  . ipratropium-albuterol  3 mL Nebulization Q4H  . levofloxacin (LEVAQUIN) IV  750 mg Intravenous Q24H  . methylPREDNISolone (SOLU-MEDROL) injection  60 mg Intravenous Q6H  .  mometasone-formoterol  2 puff Inhalation BID  . multivitamin  2 tablet Oral BID  . pantoprazole  40 mg Oral Daily  . pravastatin  40 mg Oral Daily  . sodium chloride flush  3 mL Intravenous Q12H   Continuous Infusions: . sodium chloride 50 mL/hr at 05/26/15 1940   PRN Meds:.acetaminophen **OR** acetaminophen, albuterol, morphine injection, ondansetron **OR** ondansetron (ZOFRAN) IV, senna-docusate, zolpidem  DVT Prophylaxis  on Eliquis  Lab Results  Component Value Date   PLT 288 05/27/2015    Antibiotics   Anti-infectives    Start     Dose/Rate Route Frequency Ordered Stop   05/27/15 1800  levofloxacin (LEVAQUIN) IVPB 750 mg     750 mg 100 mL/hr over 90 Minutes Intravenous Every 24 hours 05/26/15 1915 06/03/15 1759   05/26/15 1745  levofloxacin (LEVAQUIN) IVPB 750 mg     750 mg 100 mL/hr over 90 Minutes Intravenous  Once 05/26/15 1744 05/26/15 1946          Objective:   Filed Vitals:   05/27/15 1012 05/27/15 1226 05/27/15 1232 05/27/15 1331  BP: 111/66   116/63  Pulse:    91  Temp:    98.3 F (36.8 C)  TempSrc:    Oral  Resp:    18  Height:      Weight:  SpO2:  95% 92% 90%    Wt Readings from Last 3 Encounters:  05/27/15 75.1 kg (165 lb 9.1 oz)  05/08/15 81.194 kg (179 lb)  04/20/15 81.647 kg (180 lb)     Intake/Output Summary (Last 24 hours) at 05/27/15 1526 Last data filed at 05/27/15 1500  Gross per 24 hour  Intake 1003.34 ml  Output   2675 ml  Net -1671.66 ml     Physical Exam  Awake Alert, Oriented X 3, chronically ill-appearing [place].AT,PERRAL Supple Neck,No JVD, No cervical lymphadenopathy appriciated.  Symmetrical Chest wall movement, Good air movement bilaterally, coarse respiratory sounds bilaterally RRR,No Gallops,Rubs or new Murmurs, No Parasternal Heave +ve B.Sounds, Abd Soft, No tenderness, No organomegaly appriciated, No rebound - guarding or rigidity. No Cyanosis, Clubbing or edema, No new Rash or bruise    Data Review    Micro Results No results found for this or any previous visit (from the past 240 hour(s)).  Radiology Reports Dg Chest 2 View  05/26/2015  CLINICAL DATA:  Shortness of breath and cough today. History of COPD. Former smoker. EXAM: CHEST  2 VIEW COMPARISON:  04/20/2015; 02/12/2015; 06/25/2014; chest CT - 01/15/2015 FINDINGS: Grossly unchanged cardiac silhouette and mediastinal contours given persistently reduced lung volumes. Extensive bilateral now slightly coarsened reticular airspace opacities have progressed since the 03/2018 09/2015 examination. No definite pleural effusion. No pneumothorax. No acute osseous abnormalities. IMPRESSION: Worsening extensive bilateral coarse reticular opacities worrisome for progression of patient's underlying known interstitial lung disease, though note, superimposed infection (including atypical etiologies) is not excluded on the basis of this examination. Clinical correlation is advised. Electronically Signed   By: Sandi Mariscal M.D.   On: 05/26/2015 14:39     CBC  Recent Labs Lab 05/26/15 1500 05/27/15 0541  WBC 17.8* 16.1*  HGB 15.8 15.0  HCT 45.6 45.3  PLT 301 288  MCV 89.4 90.4  MCH 31.0 29.9  MCHC 34.6 33.1  RDW 14.4 14.8  LYMPHSABS 1.6 1.8  MONOABS 0.8 0.4  EOSABS 0.1 0.0  BASOSABS 0.0 0.0    Chemistries   Recent Labs Lab 05/26/15 1500 05/27/15 0541  NA 129*  --   K 5.0  --   CL 92*  --   CO2 30  --   GLUCOSE 112*  --   BUN 13  --   CREATININE 0.98  --   CALCIUM 8.7*  --   MG  --  2.4   ------------------------------------------------------------------------------------------------------------------ estimated creatinine clearance is 63.1 mL/min (by C-G formula based on Cr of 0.98). ------------------------------------------------------------------------------------------------------------------ No results for input(s): HGBA1C in the last 72  hours. ------------------------------------------------------------------------------------------------------------------ No results for input(s): CHOL, HDL, LDLCALC, TRIG, CHOLHDL, LDLDIRECT in the last 72 hours. ------------------------------------------------------------------------------------------------------------------  Recent Labs  05/27/15 0541  TSH 0.509   ------------------------------------------------------------------------------------------------------------------ No results for input(s): VITAMINB12, FOLATE, FERRITIN, TIBC, IRON, RETICCTPCT in the last 72 hours.  Coagulation profile  Recent Labs Lab 05/27/15 0541  INR 1.41    No results for input(s): DDIMER in the last 72 hours.  Cardiac Enzymes No results for input(s): CKMB, TROPONINI, MYOGLOBIN in the last 168 hours.  Invalid input(s): CK ------------------------------------------------------------------------------------------------------------------ Invalid input(s): POCBNP     Time Spent in minutes   35 minutes   ELGERGAWY, DAWOOD M.D on 05/27/2015 at 3:26 PM  Between 7am to 7pm - Pager - [phone]  After 7pm go to [email or web address] - password [email or web address]  [facility]   Office  [phone]

## 2015-05-28 ENCOUNTER — Telehealth: Payer: Self-pay | Admitting: Internal Medicine

## 2015-05-28 ENCOUNTER — Telehealth (HOSPITAL_COMMUNITY): Payer: Self-pay | Admitting: *Deleted

## 2015-05-28 ENCOUNTER — Ambulatory Visit: Payer: Medicare Other | Admitting: Internal Medicine

## 2015-05-28 ENCOUNTER — Inpatient Hospital Stay (HOSPITAL_COMMUNITY): Payer: Medicare Other

## 2015-05-28 DIAGNOSIS — E871 Hypo-osmolality and hyponatremia: Secondary | ICD-10-CM

## 2015-05-28 DIAGNOSIS — J9621 Acute and chronic respiratory failure with hypoxia: Secondary | ICD-10-CM | POA: Insufficient documentation

## 2015-05-28 DIAGNOSIS — J9602 Acute respiratory failure with hypercapnia: Secondary | ICD-10-CM

## 2015-05-28 DIAGNOSIS — J9601 Acute respiratory failure with hypoxia: Secondary | ICD-10-CM

## 2015-05-28 DIAGNOSIS — J84112 Idiopathic pulmonary fibrosis: Secondary | ICD-10-CM

## 2015-05-28 DIAGNOSIS — R5383 Other fatigue: Secondary | ICD-10-CM

## 2015-05-28 LAB — BASIC METABOLIC PANEL
Anion gap: 8 (ref 5–15)
BUN: 21 mg/dL — AB (ref 6–20)
CHLORIDE: 97 mmol/L — AB (ref 101–111)
CO2: 28 mmol/L (ref 22–32)
CREATININE: 0.98 mg/dL (ref 0.61–1.24)
Calcium: 8.5 mg/dL — ABNORMAL LOW (ref 8.9–10.3)
GFR calc Af Amer: >60 mL/min (ref >60)
GFR calc non Af Amer: >60 mL/min (ref >60)
Glucose, Bld: 140 mg/dL — ABNORMAL HIGH (ref 65–99)
POTASSIUM: 4 mmol/L (ref 3.5–5.1)
Sodium: 133 mmol/L — ABNORMAL LOW (ref 135–145)

## 2015-05-28 LAB — URINE CULTURE

## 2015-05-28 LAB — HEPATIC FUNCTION PANEL
ALBUMIN: 3.4 g/dL — AB (ref 3.5–5.0)
ALK PHOS: 53 U/L (ref 38–126)
ALT: 23 U/L (ref 17–63)
AST: 40 U/L (ref 15–41)
BILIRUBIN DIRECT: 0.1 mg/dL (ref 0.1–0.5)
BILIRUBIN TOTAL: 0.2 mg/dL — AB (ref 0.3–1.2)
Indirect Bilirubin: 0.1 mg/dL — ABNORMAL LOW (ref 0.3–0.9)
Total Protein: 6.9 g/dL (ref 6.5–8.1)

## 2015-05-28 LAB — CBC
HEMATOCRIT: 45.9 % (ref 39.0–52.0)
Hemoglobin: 15.1 g/dL (ref 13.0–17.0)
MCH: 30 pg (ref 26.0–34.0)
MCHC: 32.9 g/dL (ref 30.0–36.0)
MCV: 91.1 fL (ref 78.0–100.0)
PLATELETS: 294 10*3/uL (ref 150–400)
RBC: 5.04 MIL/uL (ref 4.22–5.81)
RDW: 15 % (ref 11.5–15.5)
WBC: 25.2 10*3/uL — AB (ref 4.0–10.5)

## 2015-05-28 LAB — HEMOGLOBIN A1C
Hgb A1c MFr Bld: 6.1 % — ABNORMAL HIGH (ref 4.8–5.6)
Mean Plasma Glucose: 128 mg/dL

## 2015-05-28 LAB — BRAIN NATRIURETIC PEPTIDE: B Natriuretic Peptide: 54.6 pg/mL (ref 0.0–100.0)

## 2015-05-28 MED ORDER — ALBUTEROL SULFATE (2.5 MG/3ML) 0.083% IN NEBU: 2.5000 mg | INHALATION_SOLUTION | RESPIRATORY_TRACT | Status: DC | PRN

## 2015-05-28 MED ORDER — FUROSEMIDE 40 MG PO TABS
40.0000 mg | ORAL_TABLET | Freq: Every day | ORAL | Status: DC
  Administered 2015-05-29: 40 mg via ORAL
  Filled 2015-05-28: qty 1

## 2015-05-28 MED ORDER — IPRATROPIUM-ALBUTEROL 0.5-2.5 (3) MG/3ML IN SOLN
3.0000 mL | Freq: Four times a day (QID) | RESPIRATORY_TRACT | Status: DC
  Administered 2015-05-29 (×3): 3 mL via RESPIRATORY_TRACT
  Filled 2015-05-28 (×3): qty 3

## 2015-05-28 NOTE — Progress Notes (Addendum)
Patient Demographics  Joshua Bullock, is a 82 y.o. male, DOB - 1932/05/07, [id number]  Admit date - 05/26/2015   Admitting Physician Nat Math, MD  Outpatient Primary MD for the patient is [name], MD  LOS - 2   Chief Complaint  Patient presents with  . Shortness of Breath         Subjective:   Joshua Bullock today has, No headache, No chest pain, No abdominal pain - Reports cough and dyspnea at baseline, reports poor appetite. Assessment & Plan    Principal Problem:   Acute respiratory failure with hypoxia and hypercarbia (HCC) Active Problems:   Hyperlipidemia   IPF (idiopathic pulmonary fibrosis) (HCC)   Hyponatremia  Acute on chronic hypoxic respiratory failure  - Multifactorial, baseline IPF, with worsening respiratory failure secondary to IPF exacerbation, as well as evidence of pulmonary edema and volume overload and Possible diaphragmatic paralysis due to progression of ILD. - Improving, back to baseline  IPF exacerbation  - Pulmonary consult greatly appreciated, continue with IV steroids, continue with IV levofloxacin, continue with nebs  - Leukocytosis in the setting of IV steroids, a febrile  Acute on chronic diastolic CHF  -Improving with IV diuresis, volume status significantly improved, appears euvolemic, - 4,141 since admission, transition to by mouth Lasix  Dysphagia - SLP evaluation pending  Hyponatremia - Due  to volume overload, improving with diuresis  Paroxysmal atrial fibrillation - Continue with Eliquis  Hypertension - Continue with amlodipine  Hyperlipidemia - Continue with statin  Code Status: Full  Family Communication: wife  at bedside  Disposition Plan: SNF in 24 Hrs if continues to improve   Procedures  none   Consults PCCM   Medications  Scheduled Meds: . amLODipine  5 mg Oral Daily  . apixaban  5 mg Oral BID WC   . aspirin EC  81 mg Oral Daily  . azelastine  2 spray Each Nare BID  . furosemide  40 mg Intravenous Daily  . guaiFENesin  1,200 mg Oral BID  . ipratropium-albuterol  3 mL Nebulization Q4H  . levofloxacin (LEVAQUIN) IV  750 mg Intravenous Q24H  . methylPREDNISolone (SOLU-MEDROL) injection  60 mg Intravenous Q6H  . mometasone-formoterol  2 puff Inhalation BID  . multivitamin  2 tablet Oral BID  . pantoprazole  40 mg Oral Daily  . pravastatin  40 mg Oral Daily  . sodium chloride flush  3 mL Intravenous Q12H   Continuous Infusions: . sodium chloride 50 mL/hr at 05/28/15 0642   PRN Meds:.acetaminophen **OR** acetaminophen, albuterol, morphine injection, ondansetron **OR** ondansetron (ZOFRAN) IV, senna-docusate, zolpidem  DVT Prophylaxis  on Eliquis  Lab Results  Component Value Date   PLT 294 05/28/2015    Antibiotics   Anti-infectives    Start     Dose/Rate Route Frequency Ordered Stop   05/27/15 1800  levofloxacin (LEVAQUIN) IVPB 750 mg     750 mg 100 mL/hr over 90 Minutes Intravenous Every 24 hours 05/26/15 1915 06/03/15 1759   05/26/15 1745  levofloxacin (LEVAQUIN) IVPB 750 mg     750 mg 100 mL/hr over 90 Minutes Intravenous  Once 05/26/15 1744 05/26/15 1946          Objective:   Filed Vitals:   05/28/15  [id number] 05/28/15 0835 05/28/15 1236 05/28/15 1311  BP: 125/72   118/67  Pulse: 80   97  Temp: 97.5 F (36.4 C)   97.8 F (36.6 C)  TempSrc: Oral   Axillary  Resp: 20   21  Height:      Weight:      SpO2: 98% 92% 94% 93%    Wt Readings from Last 3 Encounters:  05/28/15 75.6 kg (166 lb 10.7 oz)  05/08/15 81.194 kg (179 lb)  04/20/15 81.647 kg (180 lb)     Intake/Output Summary (Last 24 hours) at 05/28/15 1333 Last data filed at 05/28/15 1311  Gross per 24 hour  Intake 1881.67 ml  Output   3951 ml  Net -2069.33 ml     Physical Exam  Awake Alert, Oriented X 3, chronically ill-appearing [place].AT,PERRAL Supple Neck,No JVD, No cervical  lymphadenopathy appriciated.  Symmetrical Chest wall movement, Good air movement bilaterally, coarse respiratory sounds bilaterally RRR,No Gallops,Rubs or new Murmurs, No Parasternal Heave +ve B.Sounds, Abd Soft, No tenderness, No organomegaly appriciated, No rebound - guarding or rigidity. No Cyanosis, Clubbing or edema, No new Rash or bruise    Data Review   Micro Results No results found for this or any previous visit (from the past 240 hour(s)).  Radiology Reports Dg Chest 2 View  05/28/2015  CLINICAL DATA:  Acute respiratory failure with hypoxemia. COPD. Former smoker. EXAM: CHEST  2 VIEW COMPARISON:  05/26/2015 FINDINGS: Low lung volumes again noted. Extensive bilateral irregular interstitial densities. These are stable when compared to the prior exam. No pleural effusion or pneumothorax. Cardiac silhouette is normal size. No mediastinal or hilar masses or convincing adenopathy. IMPRESSION: 1. No significant change from the prior study. 2. Extensive bilateral irregular interstitial densities consistent with interstitial fibrosis. As mentioned on the prior study, superimposed infection is possible. No convincing pulmonary edema. Electronically Signed   By: Lajean Manes M.D.   On: 05/28/2015 11:43   Dg Chest 2 View  05/26/2015  CLINICAL DATA:  Shortness of breath and cough today. History of COPD. Former smoker. EXAM: CHEST  2 VIEW COMPARISON:  04/20/2015; 02/12/2015; 06/25/2014; chest CT - 01/15/2015 FINDINGS: Grossly unchanged cardiac silhouette and mediastinal contours given persistently reduced lung volumes. Extensive bilateral now slightly coarsened reticular airspace opacities have progressed since the 03/2018 09/2015 examination. No definite pleural effusion. No pneumothorax. No acute osseous abnormalities. IMPRESSION: Worsening extensive bilateral coarse reticular opacities worrisome for progression of patient's underlying known interstitial lung disease, though note, superimposed  infection (including atypical etiologies) is not excluded on the basis of this examination. Clinical correlation is advised. Electronically Signed   By: Sandi Mariscal M.D.   On: 05/26/2015 14:39     CBC  Recent Labs Lab 05/26/15 1500 05/27/15 0541 05/28/15 0448  WBC 17.8* 16.1* 25.2*  HGB 15.8 15.0 15.1  HCT 45.6 45.3 45.9  PLT 301 288 294  MCV 89.4 90.4 91.1  MCH 31.0 29.9 30.0  MCHC 34.6 33.1 32.9  RDW 14.4 14.8 15.0  LYMPHSABS 1.6 1.8  --   MONOABS 0.8 0.4  --   EOSABS 0.1 0.0  --   BASOSABS 0.0 0.0  --     Chemistries   Recent Labs Lab 05/26/15 1500 05/27/15 0541 05/28/15 0448  NA 129*  --  133*  K 5.0  --  4.0  CL 92*  --  97*  CO2 30  --  28  GLUCOSE 112*  --  140*  BUN 13  --  21*  CREATININE 0.98  --  0.98  CALCIUM 8.7*  --  8.5*  MG  --  2.4  --   AST  --   --  40  ALT  --   --  23  ALKPHOS  --   --  53  BILITOT  --   --  0.2*   ------------------------------------------------------------------------------------------------------------------ estimated creatinine clearance is 63.1 mL/min (by C-G formula based on Cr of 0.98). ------------------------------------------------------------------------------------------------------------------  Recent Labs  05/27/15 0541  HGBA1C 6.1*   ------------------------------------------------------------------------------------------------------------------ No results for input(s): CHOL, HDL, LDLCALC, TRIG, CHOLHDL, LDLDIRECT in the last 72 hours. ------------------------------------------------------------------------------------------------------------------  Recent Labs  05/27/15 0541  TSH 0.509   ------------------------------------------------------------------------------------------------------------------ No results for input(s): VITAMINB12, FOLATE, FERRITIN, TIBC, IRON, RETICCTPCT in the last 72 hours.  Coagulation profile  Recent Labs Lab 05/27/15 0541  INR 1.41    No results for input(s):  DDIMER in the last 72 hours.  Cardiac Enzymes No results for input(s): CKMB, TROPONINI, MYOGLOBIN in the last 168 hours.  Invalid input(s): CK ------------------------------------------------------------------------------------------------------------------ Invalid input(s): POCBNP     Time Spent in minutes   25 minutes   Joshua Bullock M.D on 05/28/2015 at 1:33 PM  Between 7am to 7pm - Pager - [phone]  After 7pm go to [email or web address] - password [facility]  [facility]   Office  [phone]

## 2015-05-28 NOTE — Telephone Encounter (Signed)
Wells Guiles (spouse) called in to cancelled appt. And to also  inform PCP that pt is in the hospital. She is concerned that spouse will run out of medications considering that appt scheduled is for a FU .    Please advise.

## 2015-05-28 NOTE — Clinical Social Work Note (Signed)
Clinical Social Work Assessment  Patient Details  Name: Joshua Bullock MRN: [id number] Date of Birth: [birth year]  Date of referral:  05/28/15               Reason for consult:  Facility Placement                Permission sought to share information with:  Family Supports, Chartered certified accountant granted to share information::  Yes, Verbal Permission Granted  Name::     Joshua Bullock   Agency::  [facility] SNF search   Relationship::  Spouse  Contact Information:     Housing/Transportation Living arrangements for the past 2 months:  Single Family Home Source of Information:  Patient Patient Interpreter Needed:  None Criminal Activity/Legal Involvement Pertinent to Current Situation/Hospitalization:  No - Comment as needed Significant Relationships:  Spouse Lives with:  Spouse Do you feel safe going back to the place where you live?  Yes Need for family participation in patient care:  No (Coment)  Care giving concerns:  Pt admitted from home with wife. PT recommending short-term rehab at a SNF.   Social Worker assessment / plan:  Per MD, pt potentially ready for [employer].   CSW received recommendation for pt for SNF. BSW Intern met with pt and pt wife at bedside. BSW Intern introduced self and explained role. BSW Intern discussed recommendation from PT for short-term rehab at a SNF. Pt is agreeable to a [facility] search. Pt and pt wife state their first choices are [facility], [place], [place], or [place], as she has been doing research of facilities to be proactive. Pt wife stated she has connections at [facility] and [place]. BSW Intern explained bed offers are dependent on availability.   BSW Intern to complete FL2 and conduct a [facility] search.  CSW to follow-up with bed offers and continue to follow.  Employment status:  Retired Forensic scientist:  Medicare PT Recommendations:  [place] / Referral to community resources:  [place]  Patient/Family's Response to care:  Pt alert and oriented x4. Pt is pleasant and engaged in conversation. Pt wife is very involved in pt care and has been proactive in pt's care plans. Pt wife thanked Engineer, water for help.   Patient/Family's Understanding of and Emotional Response to Diagnosis, Current Treatment, and Prognosis:  Pt and pt wife are aware of pt's medical condition and treatment recommended. They have no further questions at this time.  Emotional Assessment Appearance:  Appears stated age Attitude/Demeanor/Rapport:  Other (Appropriate) Affect (typically observed):  Accepting, Pleasant Orientation:  Oriented to Self, Oriented to Place, Oriented to  Time, Oriented to Situation Alcohol / Substance use:  Not Applicable Psych involvement (Current and /or in the community):  No (Comment)  Discharge Needs  Concerns to be addressed:  No discharge needs identified Readmission within the last 30 days:  No Current discharge risk:  None Barriers to Discharge:  Continued Medical Work up   [employer], Student-SW 05/28/2015, 3:05 PM

## 2015-05-28 NOTE — Clinical Social Work Placement (Signed)
   CLINICAL SOCIAL WORK PLACEMENT  NOTE  Date:  05/28/2015  Patient Details  Name: Joshua Bullock MRN: [id number] Date of Birth: 1932/12/29  Clinical Social Work is seeking post-discharge placement for this patient at the [place] level of care (*CSW will initial, date and re-position this form in  chart as items are completed):  Yes   Patient/family provided with [place] Work Department's list of facilities offering this level of care within the geographic area requested by the patient (or if unable, by the patient's family).  Yes   Patient/family informed of their freedom to choose among providers that offer the needed level of care, that participate in Medicare, Medicaid or managed care program needed by the patient, have an available bed and are willing to accept the patient.  Yes   Patient/family informed of 's ownership interest in [facility] and [facility], as well as of the fact that they are under no obligation to receive care at these facilities.  PASRR submitted to EDS on 05/28/15     PASRR number received on 05/28/15     Existing PASRR number confirmed on       FL2 transmitted to all facilities in geographic area requested by pt/family on 05/28/15     FL2 transmitted to all facilities within larger geographic area on 05/28/15     Patient informed that his/her managed care company has contracts with or will negotiate with certain facilities, including the following:            Patient/family informed of bed offers received.  Patient chooses bed at       Physician recommends and patient chooses bed at      Patient to be transferred to   on  .  Patient to be transferred to facility by       Patient family notified on   of transfer.  Name of family member notified:        PHYSICIAN       Additional Comment:    _______________________________________________ Harlon Flor, Student-SW 05/28/2015,  4:31 PM

## 2015-05-28 NOTE — NC FL2 (Signed)
[place] LEVEL OF CARE SCREENING TOOL     IDENTIFICATION  Patient Name: Joshua Bullock Birthdate: January 04, 1933 Sex: male Admission Date (Current Location): 05/26/2015  [facility] and [place] Number:  Herbalist and Address:  [facility],  [place] [address], [place]      Provider Number: [id number]  Attending Physician Name and Address:  Albertine Patricia, MD  Relative Name and Phone Number:       Current Level of Care: Hospital Recommended Level of Care: [place] Prior Approval Number:    Date Approved/Denied:   PASRR Number: [id number]  Discharge Plan: SNF    Current Diagnoses: Patient Active Problem List   Diagnosis Date Noted  . Acute on chronic respiratory failure with hypoxia ([place])   . Acute on chronic respiratory failure ([place]) 05/26/2015  . Acute respiratory failure with hypoxia and hypercarbia ([place]) 05/26/2015  . Hyponatremia 05/26/2015  . Chronic respiratory failure ([place]) 04/20/2015  . IPF (idiopathic pulmonary fibrosis) ([place]) 03/09/2015  . Restrictive lung disease 01/08/2015  . Paroxysmal atrial fibrillation ([place]) 01/08/2015  . Follow-up ------------PCP NOTES 11/29/2014  . Gait disorder 08/30/2014  . Anxiety state 07/30/2014  . Pulmonary fibrosis ([place]) 06/25/2014  . Troponin level elevated 06/25/2014  . Renal insufficiency 06/25/2014  . Diastolic dysfunction-Grade 2 on echo 06/24/14 06/25/2014  . New onset a-fib ([place]) 06/23/2014  . Hypotension 06/23/2014  . Atrial fibrillation with RVR ([place])   . Hypogonadism male 09/01/2012  . Screening for prostate cancer 09/01/2012  . Pituitary insufficiency ([place]) 06/04/2012  . Early Macular degeneration 11/20/2011  . Annual physical exam 05/09/2011  . COPD - cough 05/09/2011  . DECREASED HEARING 11/05/2009  . ALLERGIC RHINITIS 05/17/2008  . Prediabetes 05/17/2008  . OSTEOPENIA 11/15/2007  . Hyperlipidemia 07/29/2006    Orientation RESPIRATION  BLADDER Height & Weight     Self, Time, Situation, Place  O2 (4 L/min) Continent Weight: 166 lb 10.7 oz (75.6 kg) Height:  5' 10.5" (179.1 cm)  BEHAVIORAL SYMPTOMS/MOOD NEUROLOGICAL BOWEL NUTRITION STATUS  Other (Comment) (None)  (N/a) Continent Diet (Heart Room service appropriate; Fluid consistency: Thin)  AMBULATORY STATUS COMMUNICATION OF NEEDS Skin   Limited Assist Verbally Normal                       Personal Care Assistance Level of Assistance  Bathing, Feeding, Dressing Bathing Assistance: Limited assistance Feeding assistance: Independent Dressing Assistance: Limited assistance     Functional Limitations Info  Sight, Hearing, Speech Sight Info: Impaired (Wears glasses) Hearing Info: Adequate Speech Info: Adequate    SPECIAL CARE FACTORS FREQUENCY  PT (By licensed PT), OT (By licensed OT)     PT Frequency: 5x week OT Frequency: 5 x week            Contractures Contractures Info: Not present    Additional Factors Info  Code Status, Allergies Code Status Info: FULL Allergies Info: Influenda Virus Vacc Split Pf, Tetanus Toxoid           Current Medications (05/28/2015):  This is the current hospital active medication list Current Facility-Administered Medications  Medication Dose Route Frequency Provider Last Rate Last Dose  . 0.9 %  sodium chloride infusion   Intravenous Continuous Simbiso Ranga, MD 50 mL/hr at 05/28/15 [id number]    . acetaminophen (TYLENOL) tablet 650 mg  650 mg Oral Q6H PRN Simbiso Ranga, MD       Or  . acetaminophen (TYLENOL) suppository 650 mg  650 mg Rectal Q6H PRN Simbiso Ranga, MD      . albuterol (PROVENTIL) (2.5 MG/3ML) 0.083% nebulizer solution 2.5 mg  2.5 mg Nebulization Q2H PRN Simbiso Ranga, MD   2.5 mg at 05/26/15 2029  . amLODipine (NORVASC) tablet 5 mg  5 mg Oral Daily Simbiso Ranga, MD   5 mg at 05/28/15 0900  . apixaban (ELIQUIS) tablet 5 mg  5 mg Oral BID WC Simbiso Ranga, MD   5 mg at 05/28/15 0859  . aspirin EC tablet  81 mg  81 mg Oral Daily Simbiso Ranga, MD   81 mg at 05/28/15 0900  . azelastine (ASTELIN) 0.1 % nasal spray 2 spray  2 spray Each Nare BID Nat Math, MD   2 spray at 05/28/15 0858  . [START ON 05/29/2015] furosemide (LASIX) tablet 40 mg  40 mg Oral Daily Albertine Patricia, MD      . guaiFENesin (MUCINEX) 12 hr tablet 1,200 mg  1,200 mg Oral BID Simbiso Ranga, MD   1,200 mg at 05/28/15 0900  . ipratropium-albuterol (DUONEB) 0.5-2.5 (3) MG/3ML nebulizer solution 3 mL  3 mL Nebulization Q4H Simbiso Ranga, MD   3 mL at 05/28/15 1559  . levofloxacin (LEVAQUIN) IVPB 750 mg  750 mg Intravenous Q24H Simbiso Ranga, MD   750 mg at 05/27/15 1734  . methylPREDNISolone sodium succinate (SOLU-MEDROL) 125 mg/2 mL injection 60 mg  60 mg Intravenous Q6H Simbiso Ranga, MD   60 mg at 05/28/15 1150  . mometasone-formoterol (DULERA) 200-5 MCG/ACT inhaler 2 puff  2 puff Inhalation BID Nat Math, MD   2 puff at 05/28/15 0837  . morphine 2 MG/ML injection 2 mg  2 mg Intravenous Q4H PRN Simbiso Ranga, MD      . multivitamin (PROSIGHT) tablet 2 tablet  2 tablet Oral BID Simbiso Ranga, MD   2 tablet at 05/28/15 0900  . ondansetron (ZOFRAN) tablet 4 mg  4 mg Oral Q6H PRN Simbiso Ranga, MD       Or  . ondansetron (ZOFRAN) injection 4 mg  4 mg Intravenous Q6H PRN Simbiso Ranga, MD      . pantoprazole (PROTONIX) EC tablet 40 mg  40 mg Oral Daily Simbiso Ranga, MD   40 mg at 05/28/15 0900  . pravastatin (PRAVACHOL) tablet 40 mg  40 mg Oral Daily Simbiso Ranga, MD   40 mg at 05/28/15 0859  . senna-docusate (Senokot-S) tablet 1 tablet  1 tablet Oral QHS PRN Simbiso Ranga, MD      . sodium chloride flush (NS) 0.9 % injection 3 mL  3 mL Intravenous Q12H Simbiso Ranga, MD   3 mL at 05/28/15 0859  . zolpidem (AMBIEN) tablet 5 mg  5 mg Oral QHS PRN Nat Math, MD         Discharge Medications: Please see discharge summary for a list of discharge medications.  Relevant Imaging Results:  Relevant Lab  Results:   Additional Information SSN: [id number]  Harlon Flor, Student-SW [phone]

## 2015-05-28 NOTE — Evaluation (Signed)
Occupational Therapy Evaluation Patient Details Name: Joshua Bullock MRN: [id number] DOB: 14-Nov-1932 Today's Date: 05/28/2015    History of Present Illness Joshua Bullock is a pleasant 82 y.o. male with severe Idiopathic pulmonary fibrosis(On Esberiet started 3 weeks ago) causing chronic hypoxic/hypercapneic respiratory failure on supplementary oxygen , who comes in with worsening shortness of breath, and not feeling well, associated with hypoxia(O2 sat dropped below 88% this morning) , and he is found to have Acute exacerbation of Interstitial Lung disease causing acute on chronic hypercapnic, hypoxic respiratory failure likely related to superimposed pneumonia   Clinical Impression   Pt admitted with SOB. Pt currently with functional limitations due to the deficits listed below (see OT Problem List).  Pt will benefit from skilled OT to increase their safety and independence with ADL and functional mobility for ADL to facilitate discharge to venue listed below.      Follow Up Recommendations  SNF    Equipment Recommendations  None recommended by OT       Precautions / Restrictions Precautions Precaution Comments: very DOE, on 4 liters      Mobility Bed Mobility Overal bed mobility: Needs Assistance Bed Mobility: Supine to Sit;Sit to Supine     Supine to sit: Min assist Sit to supine: Min assist   General bed mobility comments: cues for safety and to SLOW down  Transfers                 General transfer comment: did not perform.  Pt became more SOB and unable to take a deep breath    Balance                                            ADL Overall ADL's : Needs assistance/impaired     Grooming: Bed level;Minimal assistance                                 General ADL Comments: Pt sat EOB for approx 5 min.  Pt not able to take deep breaths and was basically panting.  Returned to supine and encouraged long deep breathes.  Introduced Chiropodist to pt and educated on use. Pt needed increased time and practice to perform                Pertinent Vitals/Pain Pain Assessment: No/denies pain     Hand Dominance     Extremity/Trunk Assessment Upper Extremity Assessment Upper Extremity Assessment: Generalized weakness RUE Deficits / Details: extraneous  arm movements           Communication Communication Communication: No difficulties   Cognition Arousal/Alertness: Awake/alert Behavior During Therapy: WFL for tasks assessed/performed Overall Cognitive Status: Within Functional Limits for tasks assessed                     General Comments   Much of session focused on deep breathing techniques as well as slowing down breathing rather than panting            Home Living Family/patient expects to be discharged to:: Skilled nursing facility Living Arrangements: Spouse/significant other Available Help at Discharge: Family Type of Home: House Home Access: Stairs to enter [employer] of Steps: 2   Home Layout: One level  Home Equipment: [place] - 2 wheels          Prior Functioning/Environment Level of Independence: Needs assistance  Gait / Transfers Assistance Needed: with RW, oxygen          OT Diagnosis: Generalized weakness   OT Problem List: Decreased activity tolerance;Impaired balance (sitting and/or standing);Decreased safety awareness;Cardiopulmonary status limiting activity   OT Treatment/Interventions: Self-care/ADL training;Energy conservation;DME and/or AE instruction;Patient/family education    OT Goals(Current goals can be found in the care plan section) Acute Rehab OT Goals Patient Stated Goal: to get  up and walk OT Goal Formulation: With patient Time For Goal Achievement: 06/11/15 Potential to Achieve Goals: Fair ADL Goals Pt Will Perform Grooming: with set-up;sitting Pt Will Transfer to Toilet: with min  assist;bedside commode Additional ADL Goal #1: Pt will perform deep breathing at mod I level to be able to perform increased ADL activiity  OT Frequency: Min 2X/week   Barriers to D/C:            Co-evaluation              End of Session Nurse Communication: Mobility status  Activity Tolerance: Patient limited by fatigue Patient left: in bed;with call bell/phone within reach   Time: 1454-1545 OT Time Calculation (min): 51 min Charges:  OT General Charges $OT Visit: 1 Procedure OT Evaluation $OT Eval Moderate Complexity: 1 Procedure OT Treatments $Self Care/Home Management : 23-37 mins G-Codes:    [name] 10-Jun-2015, 6:54 PM

## 2015-05-28 NOTE — Clinical Social Work Placement (Deleted)
   CLINICAL SOCIAL WORK PLACEMENT  NOTE  Date:  05/28/2015  Patient Details  Name: [name] MRN: [id number] Date of Birth: [birth year]  Clinical Social Work is seeking post-discharge placement for this patient at the [place] level of care (*CSW will initial, date and re-position this form in  chart as items are completed):  Yes   Patient/family provided with [place] Work Department's list of facilities offering this level of care within the geographic area requested by the patient (or if unable, by the patient's family).  Yes   Patient/family informed of their freedom to choose among providers that offer the needed level of care, that participate in Medicare, Medicaid or managed care program needed by the patient, have an available bed and are willing to accept the patient.  Yes   Patient/family informed of [place]'s ownership interest in [facility] and [facility], as well as of the fact that they are under no obligation to receive care at these facilities.  PASRR submitted to EDS on       PASRR number received on       Existing PASRR number confirmed on       FL2 transmitted to all facilities in geographic area requested by pt/family on 05/28/15     FL2 transmitted to all facilities within larger geographic area on 05/28/15     Patient informed that his/her managed care company has contracts with or will negotiate with certain facilities, including the following:            Patient/family informed of bed offers received.  Patient chooses bed at       Physician recommends and patient chooses bed at      Patient to be transferred to   on  .  Patient to be transferred to facility by       Patient family notified on   of transfer.  Name of family member notified:        PHYSICIAN       Additional Comment:    _______________________________________________ Harlon Flor, Student-SW 05/28/2015, 3:05 PM

## 2015-05-28 NOTE — Progress Notes (Signed)
[facility] PCCM  Brief: 82 y/o male with likely IPF followed by Dr. Lenna Gilford who was referred to Dr. Chase Caller for a second opinion was admitted on 3/5 with hypoxemia and dyspnea.  Started Esbriet three weeks prior to admission which was associated with lack of energy and poor appetite.  Notes mother with rheumatoid arthritis, no personal diagnosis.  Notes dysphagia at home.  Says symptoms started after completing steroids.  S: Feeling better No dysphagia here, but had some at home   O: Filed Vitals:   05/28/15 0435 05/28/15 0500 05/28/15 0608 05/28/15 0835  BP:   125/72   Pulse: 87  80   Temp:   97.5 F (36.4 C)   TempSrc:   Oral   Resp: 24  20   Height:      Weight:  75.6 kg (166 lb 10.7 oz)    SpO2: 94%  98% 92%   4L [place]  Gen: chronically ill appearing, mildly dyspneic HENT: NCAT, OP clear, neck supple without masses Eyes: PERRL, EOMi Lymph: no cervical lymphadenopathy PULM: Crackles bases, pectus excavatum, no accessory muscle use CV: RRR, no mgr, no JVD GI: BS+, soft, nontender, no hsm Derm: no rash or skin breakdown MSK: normal bulk and tone Neuro: A&Ox4, CN II-XII intact, strength 5/5 in all 4 extremities Psyche: normal mood and affect    CBC    Component Value Date/Time   WBC 25.2* 05/28/2015 0448   RBC 5.04 05/28/2015 0448   HGB 15.1 05/28/2015 0448   HCT 45.9 05/28/2015 0448   PLT 294 05/28/2015 0448   MCV 91.1 05/28/2015 0448   MCH 30.0 05/28/2015 0448   MCHC 32.9 05/28/2015 0448   RDW 15.0 05/28/2015 0448   LYMPHSABS 1.8 05/27/2015 0541   MONOABS 0.4 05/27/2015 0541   EOSABS 0.0 05/27/2015 0541   BASOSABS 0.0 05/27/2015 0541    BMET    Component Value Date/Time   NA 133* 05/28/2015 0448   K 4.0 05/28/2015 0448   CL 97* 05/28/2015 0448   CO2 28 05/28/2015 0448   GLUCOSE 140* 05/28/2015 0448   BUN 21* 05/28/2015 0448   CREATININE 0.98 05/28/2015 0448   CALCIUM 8.5* 05/28/2015 0448   GFRNONAA >60 05/28/2015 0448   GFRAA >60 05/28/2015 0448     12/2014 CT chest images personally reviewed with peripheral honey combing and fibrotic changes and a few peripheral cyst-like changes consistent with UIP  3/4 CXR Images personally reviewed: low lung volumes, diffuse bilateral airspace disease and fibrotic changes, no pleural effusion  From Dr. Jeannine Kitten office notes:  Baseline CXR 10/2009 showed norm heart size, mild bronchitic changes, NAD   Last CXR 06/23/14 showed borderline heart size, increased interstitial markings diffusely & atx in LLL  Myoview 06/28/14 showed low risk nuclear study w/ sm area of attenuation inferiorly, norm LVF w/ EF=57%, normal wall motion, etc...  2DEcho 06/2014 showed norm LV size & function w/ EF=60-65%, no regional wall motion abn, severe focal basal septal hypertrophyGr 2 DD, mildly thickened AoV & MV leaflets w/ mild MR; RV- poorly vis...  Full PFTs done 12/14/14 showed FVC=2.28 (54%), FEV1=1.75 (58%), %1sec=77, mid-flows sl reduced at 68% predicted; after the bronchodil the FEV1 was unchanged; TLC=4.48 (61%), RV=2.18 (79%), RV/TLC=49; DLCO=47% predicted; these PFTs show a moderate restricted ventilatory defect w/ superimposed small airways disease; Diffusion is significantly reduced...   Routine LABS 9-12/2014: Chems- wnl; CBC- wnl w/ Hg=13.8; Low-T noted...   Ambulatory O2sat test 01/08/15> O2sat=95% on RA at rest w/ pulse=61/min; pt ambulated 3  laps w/ lowest O2sat=89% w/ pulse=86/min  Additional LABS & collagen-vasc screen 10/16> Sed=17, CRP=0.6 (norm<20), ACE=29, RheumFactor<10, ANA=neg, ANCA=neg (MPO & PR-3)...  Hi-resolution CT Chest> Done 01/16/15: Norm heart size, atherosclerotic changes, mildly enlarged mediastinal LNs (1.2-1.5cm size), no pulm nodules or emphysema, +ILD w/ patchy subpleural reticulation bilat w/ assoc traction bronchiectassis & scatered regions of honeycobing (anterior upper lobes and posterior lower lobes) w/ air trapping on expiration, min areas of GG opac noted; this  is felt to be c/w UIP pathology; note: granulomatous calcif in liver & spleen, +gallstone, +DJD Tspine...   Impression: Acute on chronic respiratory failure with hypoxemia: presumably flare of underlying ILD, I question some steroid dependence, could this be an autoimmune process (mother with RA)? 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 Germain Osgood he is here  Roselie Awkward, MD [place] PCCM Pager: [phone] Cell: [phone] After 3pm or if no response, call [phone]

## 2015-05-28 NOTE — Telephone Encounter (Signed)
Patient has been in hospital since Saturday due to lack of oxygen.   Was seen by Dr. Lake Bells today. Wife says that he may be released to a rehab facility tomorrow, but not sure yet.  FYI to MR:  Patient is in hospital in room 1438 @ [place] # is [phone]

## 2015-05-29 ENCOUNTER — Encounter (HOSPITAL_COMMUNITY): Payer: Medicare Other

## 2015-05-29 DIAGNOSIS — J9621 Acute and chronic respiratory failure with hypoxia: Secondary | ICD-10-CM

## 2015-05-29 MED ORDER — GUAIFENESIN ER 600 MG PO TB12: 1200.0000 mg | ORAL_TABLET | Freq: Two times a day (BID) | ORAL | Status: AC

## 2015-05-29 MED ORDER — METHYLPREDNISOLONE SODIUM SUCC 40 MG IJ SOLR
40.0000 mg | Freq: Four times a day (QID) | INTRAMUSCULAR | Status: DC
  Administered 2015-05-29: 40 mg via INTRAVENOUS
  Filled 2015-05-29: qty 1

## 2015-05-29 MED ORDER — ACETAMINOPHEN 650 MG RE SUPP: 650.0000 mg | Freq: Four times a day (QID) | RECTAL | Status: AC | PRN

## 2015-05-29 MED ORDER — PREDNISONE 20 MG PO TABS: ORAL_TABLET | ORAL | Status: AC

## 2015-05-29 MED ORDER — PANTOPRAZOLE SODIUM 40 MG PO TBEC: 40.0000 mg | DELAYED_RELEASE_TABLET | Freq: Every day | ORAL | Status: AC

## 2015-05-29 MED ORDER — FUROSEMIDE 40 MG PO TABS: 40.0000 mg | ORAL_TABLET | Freq: Every day | ORAL | Status: AC

## 2015-05-29 MED ORDER — IPRATROPIUM-ALBUTEROL 0.5-2.5 (3) MG/3ML IN SOLN: 3.0000 mL | Freq: Four times a day (QID) | RESPIRATORY_TRACT | Status: AC

## 2015-05-29 MED ORDER — FLUCONAZOLE 100 MG PO TABS: 100.0000 mg | ORAL_TABLET | Freq: Every day | ORAL | Status: AC

## 2015-05-29 MED ORDER — MAGIC MOUTHWASH
5.0000 mL | Freq: Four times a day (QID) | ORAL | Status: DC
  Administered 2015-05-29: 5 mL via ORAL
  Filled 2015-05-29 (×3): qty 5

## 2015-05-29 MED ORDER — ONDANSETRON HCL 4 MG PO TABS: 4.0000 mg | ORAL_TABLET | Freq: Four times a day (QID) | ORAL | Status: AC | PRN

## 2015-05-29 MED ORDER — SENNOSIDES-DOCUSATE SODIUM 8.6-50 MG PO TABS: 1.0000 | ORAL_TABLET | Freq: Every evening | ORAL | Status: AC | PRN

## 2015-05-29 MED ORDER — ALBUTEROL SULFATE (2.5 MG/3ML) 0.083% IN NEBU: 2.5000 mg | INHALATION_SOLUTION | RESPIRATORY_TRACT | Status: AC | PRN

## 2015-05-29 MED ORDER — MAGIC MOUTHWASH: 5.0000 mL | Freq: Four times a day (QID) | ORAL | Status: AC

## 2015-05-29 MED ORDER — FLUCONAZOLE 100 MG PO TABS
100.0000 mg | ORAL_TABLET | Freq: Every day | ORAL | Status: DC
  Administered 2015-05-29: 100 mg via ORAL
  Filled 2015-05-29: qty 1

## 2015-05-29 NOTE — Discharge Summary (Signed)
Joshua Bullock, is a 82 y.o. male  DOB June 21, 1932  MRN [id number].  Admission date:  05/26/2015  Admitting Physician  Nat Math, MD  Discharge Date:  05/29/2015   Primary MD  [name], MD  Recommendations for primary care physician for things to follow:  - Patient is on 4 L nasal cannula at baseline, up to 6 L on ambulation - [facility] to follow with pulmonary as an outpatient, there are 2 appointments already set up on discharge   Admission Diagnosis  IPF (idiopathic pulmonary fibrosis) ([place]) [J84.112] Acute on chronic respiratory failure with hypoxia ([place]) [J96.21]   Discharge Diagnosis  IPF (idiopathic pulmonary fibrosis) ([place]) [J84.112] Acute on chronic respiratory failure with hypoxia ([place]) [J96.21]    Principal Problem:   Acute respiratory failure with hypoxia and hypercarbia (HCC) Active Problems:   Hyperlipidemia   IPF (idiopathic pulmonary fibrosis) (HCC)   Hyponatremia   Acute on chronic respiratory failure with hypoxia [facility])      Past Medical History  Diagnosis Date  . Hyperlipidemia   . Arthritis   . COPD (chronic obstructive pulmonary disease) ([place]) 05/09/2011    Former smoker, fine crackles at bases on exam. PFTs 2012 documented mild-moderate dz    . Hypogonadism male 09/01/2012  . DECREASED HEARING 11/05/2009    Qualifier: Diagnosis of  By: Marijean Niemann CMA, Danielle    . OSTEOPENIA 11/15/2007    Per DEXA 2008    . HYPERGLYCEMIA 05/17/2008    Qualifier: Diagnosis of  By: Larose Kells MD, Alda Berthold Sleep apnea     Past Surgical History  Procedure Laterality Date  . Tonsillectomy  1955  . Eye surgery Bilateral 2016    Cataract  . Rotator cuff repair      right       History of present illness and  Hospital Course:     Kindly see H&P for history of present illness and admission details, please review complete Labs, Consult reports and Test reports for all details in  brief  HPI  from the history and physical done on the day of admission 05/26/2015 Joshua Bullock is a pleasant 82 y.o. male with severe Idiopathic pulmonary fibrosis(On Esberiet started 3 weeks ago) causing chronic hypoxic/hypercapneic respiratory failure on supplementary oxygen , who comes in with worsening shortness of breath, and not feeling well, associated with hypoxia(O2 sat dropped below 88% this morning) , and he is found to have Acute exacerbation of Interstitial Lung disease causing acute on chronic hypercapnic, hypoxic respiratory failure likely related to superimposed pneumonia. Joshua Bullock was noted to have low grade fever of 98F and a white count of 17.800(on low dose prednisone but wbc higher than baseline) in the ED. VBG showed a normal ph. Sodium low at 129. His CXR showed "Worsening extensive bilateral coarse reticular opacities worrisome for progression of patient's underlying known interstitial lung disease, though note, superimposed infection (including atypical etiologies) is not excluded on the basis of this examination. Clinical correlation is advised". Patient suggests symptoms started this morning  when his wife noticed that he was not looking good hence calling 911. He was given prednisone 50mg , oxygen, Levaquin in the ED and referred for admission. He feels better. Pccm was consulted by the ED and they will follow in consult(primary pulmonologist Dr Chase Caller). Joshua Bullock will be admitted for further management including systemic steroids/bronchodilators/oxygen supplementation, and empiric antibiotics. He is full code.   Hospital Course   Acute on chronic hypoxic respiratory failure  - Multifactorial, baseline IPF, with worsening respiratory failure secondary to IPF exacerbation, as well as evidence of pulmonary edema and volume overload and Possible diaphragmatic paralysis due to progression of ILD. - Improving, back to baseline  IPF exacerbation  - Pulmonary consult  greatly appreciated, or pulmonary this is unclear if this is recurrent IPF flare, or if it is steroid responsive ILD which has been called IPF, initially with IV steroids, IV antibiotics, nebulizer treatment, can be discharged today, on prolonged steroid taper, and to continue with 20 mg of oral prednisone after taper until seen and followed by pulmonary regarding further recommendation. - Patient to follow with Dr. Chase Caller, ointment has been set up with his PA on the 24th and with him one the 31st - Continue to hold Esbriet per  pulmonary recommendation side effects given the thickened fatigue and weight loss, as well as questionable diagnosis of IPF.  Acute on chronic diastolic CHF  -Improving with IV diuresis, volume status significantly improved, appears euvolemic, - 6,236  since admission, and to by mouth Lasix at time of discharge   Dysphagia - Seen by SLP, mild risk of aspiration,  Oral thrush - Will discharge on Diflucan and Magic mouthwash  Hyponatremia - Due to volume overload, improving with diuresis  Paroxysmal atrial fibrillation - Continue with Eliquis  Hypertension - Continue with amlodipine  Hyperlipidemia - Continue with statin      Discharge Condition:  stable   Follow UP  Follow-up Information    Follow up with Rexene Edison, NP On 06/15/2015.   Specialty:  Pulmonary Disease   Why:  Appt at 10:45 AM    Contact information:   [address] [place] [address] [phone]       Follow up with [facility], MD On 06/22/2015.   Specialty:  Pulmonary Disease   Why:  Appt at 3:00 PM - follow up for possible ILD   Contact information:   [place] [address] [phone]       Follow up with [name], MD.   Specialty:  Internal Medicine   Why:  after  discharge from SNF   Contact information:   [place] [address] [phone]         Discharge Instructions  and  Discharge Medications      Discharge Instructions    Discharge instructions    Complete by:  As directed   Follow with Primary MD [name], MD after  discharge from SNF  Get CBC, CMP, 2 view Chest X ray checked  by Primary MD next visit.    Activity: As tolerated with Full fall precautions use walker/cane & assistance as needed   Disposition SNF   Diet: Heart Healthy  , with feeding assistance and aspiration precautions.  For Heart failure patients - Check your Weight same time everyday, if you gain over 2 pounds, or you develop in leg swelling, experience more shortness of breath or chest pain, call your Primary MD immediately. Follow Cardiac Low Salt  Diet and 1.5 lit/day fluid restriction.   On your next visit with your primary care physician please Get Medicines reviewed and adjusted.   Please request your Prim.MD to go over all Hospital Tests and Procedure/Radiological results at the follow up, please get all Hospital records sent to your Prim MD by signing hospital release before you go home.   If you experience worsening of your admission symptoms, develop shortness of breath, life threatening emergency, suicidal or homicidal thoughts you must seek medical attention immediately by calling 911 or calling your MD immediately  if symptoms less severe.  You Must read complete instructions/literature along with all the possible adverse reactions/side effects for all the Medicines you take and that have been prescribed to you. Take any new Medicines after you have completely understood and accpet all the possible adverse reactions/side effects.   Do not drive, operating heavy machinery, perform activities at heights, swimming or participation in water activities or provide baby sitting services if your were admitted for syncope or siezures until you have seen by Primary MD or a Neurologist and advised to do so again.  Do not drive when taking Pain medications.    Do not take more than prescribed Pain,  Sleep and Anxiety Medications  Special Instructions: If you have smoked or chewed Tobacco  in the last 2 yrs please stop smoking, stop any regular Alcohol  and or any Recreational drug use.  Wear Seat belts while driving.   Please note  You were cared for by a hospitalist during your hospital stay. If you have any questions about your discharge medications or the care you received while you were in the hospital after you are discharged, you can call the unit and asked to speak with the hospitalist on call if the hospitalist that took care of you is not available. Once you are discharged, your primary care physician will handle any further medical issues. Please note that NO REFILLS for any discharge medications will be authorized once you are discharged, as it is imperative that you return to your primary care physician (or establish a relationship with a primary care physician if you do not have one) for your aftercare needs so that they can reassess your need for medications and monitor your lab values.     Increase activity slowly    Complete by:  As directed             Medication List    STOP taking these medications        diltiazem 30 MG tablet  Commonly known as:  CARDIZEM     ESBRIET 267 MG Caps  Generic drug:  Pirfenidone     hydrocortisone cream 1 %     OXYGEN      TAKE these medications        acetaminophen 650 MG suppository  Commonly known as:  TYLENOL  Place 1 suppository (650 mg total) rectally every 6 (six) hours as needed for mild pain (or Fever >/= 101).     albuterol (2.5 MG/3ML) 0.083% nebulizer solution  Commonly known as:  PROVENTIL  Take 3 mLs (2.5 mg total) by nebulization every 4 (four) hours as needed for wheezing.     amLODipine 5 MG tablet  Commonly known as:  NORVASC  Take 1 tablet (5 mg total) by mouth daily.     apixaban 5 MG Tabs tablet  Commonly known as:  ELIQUIS  Take 1 tablet (5 mg total) by mouth 2 (two) times daily.  aspirin  EC 81 MG tablet  Take 81 mg by mouth daily.     azelastine 0.1 % nasal spray  Commonly known as:  ASTELIN  Place 2 sprays into both nostrils 2 (two) times daily.     citalopram 20 MG tablet  Commonly known as:  CELEXA  Take 1 tablet (20 mg total) by mouth daily.     fluconazole 100 MG tablet  Commonly known as:  DIFLUCAN  Take 1 tablet (100 mg total) by mouth daily. Take for 7 days     Fluticasone-Salmeterol 250-50 MCG/DOSE Aepb  Commonly known as:  ADVAIR  Inhale 1 puff into the lungs every 12 (twelve) hours.     furosemide 40 MG tablet  Commonly known as:  LASIX  Take 1 tablet (40 mg total) by mouth daily.     guaiFENesin 600 MG 12 hr tablet  Commonly known as:  MUCINEX  Take 2 tablets (1,200 mg total) by mouth 2 (two) times daily.     ipratropium-albuterol 0.5-2.5 (3) MG/3ML Soln  Commonly known as:  DUONEB  Take 3 mLs by nebulization every 6 (six) hours.     magic mouthwash Soln  Take 5 mLs by mouth 4 (four) times daily. take for 7 days     ondansetron 4 MG tablet  Commonly known as:  ZOFRAN  Take 1 tablet (4 mg total) by mouth every 6 (six) hours as needed for nausea.     pantoprazole 40 MG tablet  Commonly known as:  PROTONIX  Take 1 tablet (40 mg total) by mouth daily.     pravastatin 40 MG tablet  Commonly known as:  PRAVACHOL  Take 1 tablet (40 mg total) by mouth daily.     predniSONE 20 MG tablet  Commonly known as:  DELTASONE  Please take 40 mg oral twice daily for 5 days, then 40 mg oral daily for 1 week, then 30 mg oral daily for 1 week, then 20 mg oral daily thereafter until he seen and followed by pulmonary .     PRESERVISION AREDS 2 Caps  Take 2 capsules by mouth 2 (two) times daily.     senna-docusate 8.6-50 MG tablet  Commonly known as:  Senokot-S  Take 1 tablet by mouth at bedtime as needed for mild constipation.     Testosterone 20.25 MG/ACT (1.62%) Gel  Commonly known as:  ANDROGEL PUMP  USE 5 PUMPS ON THE SKIN EVERY DAY           Diet and Activity recommendation: See Discharge Instructions above   Consults obtained -  PCCM   Major procedures and Radiology Reports - PLEASE review detailed and final reports for all details, in brief -      Dg Chest 2 View  05/28/2015  CLINICAL DATA:  Acute respiratory failure with hypoxemia. COPD. Former smoker. EXAM: CHEST  2 VIEW COMPARISON:  05/26/2015 FINDINGS: Low lung volumes again noted. Extensive bilateral irregular interstitial densities. These are stable when compared to the prior exam. No pleural effusion or pneumothorax. Cardiac silhouette is normal size. No mediastinal or hilar masses or convincing adenopathy. IMPRESSION: 1. No significant change from the prior study. 2. Extensive bilateral irregular interstitial densities consistent with interstitial fibrosis. As mentioned on the prior study, superimposed infection is possible. No convincing pulmonary edema. Electronically Signed   By: Lajean Manes M.D.   On: 05/28/2015 11:43   Dg Chest 2 View  05/26/2015  CLINICAL DATA:  Shortness of breath and cough today. History of  COPD. Former smoker. EXAM: CHEST  2 VIEW COMPARISON:  04/20/2015; 02/12/2015; 06/25/2014; chest CT - 01/15/2015 FINDINGS: Grossly unchanged cardiac silhouette and mediastinal contours given persistently reduced lung volumes. Extensive bilateral now slightly coarsened reticular airspace opacities have progressed since the 03/2018 09/2015 examination. No definite pleural effusion. No pneumothorax. No acute osseous abnormalities. IMPRESSION: Worsening extensive bilateral coarse reticular opacities worrisome for progression of patient's underlying known interstitial lung disease, though note, superimposed infection (including atypical etiologies) is not excluded on the basis of this examination. Clinical correlation is advised. Electronically Signed   By: Sandi Mariscal M.D.   On: 05/26/2015 14:39    Micro Results     Recent Results (from the past 240  hour(s))  Blood culture (routine x 2)     Status: None (Preliminary result)   Collection Time: 05/26/15  2:23 PM  Result Value Ref Range Status   Specimen Description BLOOD RIGHT ARM  Final   Special Requests BOTTLES DRAWN AEROBIC AND ANAEROBIC 5CC  Final   Culture   Final    NO GROWTH 2 DAYS Performed at [facility]    Report Status PENDING  Incomplete  Blood culture (routine x 2)     Status: None (Preliminary result)   Collection Time: 05/26/15  5:53 PM  Result Value Ref Range Status   Specimen Description BLOOD RIGHT HAND  Final   Special Requests BOTTLES DRAWN AEROBIC ONLY 5CC  Final   Culture   Final    NO GROWTH 2 DAYS Performed at [facility]    Report Status PENDING  Incomplete  Urine culture     Status: None   Collection Time: 05/26/15  6:03 PM  Result Value Ref Range Status   Specimen Description URINE, CLEAN CATCH  Final   Special Requests NONE  Final   Culture   Final    20,000 COLONIES/mL DIPHTHEROIDS(CORYNEBACTERIUM SPECIES) Standardized susceptibility testing for this organism is not available. Performed at [facility]    Report Status 05/28/2015 FINAL  Final       Today   Subjective:   Joshua Bullock today has no headache,no chest or  abdominal pain, feels much better  Today, dyspnea back to baseline, no cough.  Objective:   Blood pressure 126/65, pulse 85, temperature 97.4 F (36.3 C), temperature source Oral, resp. rate 21, height 5' 10.5" (1.791 m), weight 78.6 kg (173 lb 4.5 oz), SpO2 91 %.   Intake/Output Summary (Last 24 hours) at 05/29/15 1319 Last data filed at 05/29/15 1235  Gross per 24 hour  Intake   1180 ml  Output   3275 ml  Net  -2095 ml    Exam  Awake Alert, Oriented X 3, chronically ill-appearing [place].AT,PERRAL Supple Neck,No JVD, No cervical lymphadenopathy appriciated.  Symmetrical Chest wall movement, Good air movement bilaterally, coarse respiratory sounds bilaterally RRR,No Gallops,Rubs or  new Murmurs, No Parasternal Heave +ve B.Sounds, Abd Soft, No tenderness, No organomegaly appriciated, No rebound - guarding or rigidity. No Cyanosis, Clubbing or edema, No new Rash or bruise  Data Review   CBC w Diff: Lab Results  Component Value Date   WBC 25.2* 05/28/2015   HGB 15.1 05/28/2015   HCT 45.9 05/28/2015   PLT 294 05/28/2015   LYMPHOPCT 11 05/27/2015   MONOPCT 2 05/27/2015   EOSPCT 0 05/27/2015   BASOPCT 0 05/27/2015    CMP: Lab Results  Component Value Date   NA 133* 05/28/2015   K 4.0 05/28/2015   CL 97* 05/28/2015  CO2 28 05/28/2015   BUN 21* 05/28/2015   CREATININE 0.98 05/28/2015   PROT 6.9 05/28/2015   ALBUMIN 3.4* 05/28/2015   BILITOT 0.2* 05/28/2015   ALKPHOS 53 05/28/2015   AST 40 05/28/2015   ALT 23 05/28/2015  .   Total Time in preparing paper work, data evaluation and todays exam - 35 minutes  ELGERGAWY, DAWOOD M.D on 05/29/2015 at 1:19 PM  [facility]   Office  [phone]

## 2015-05-29 NOTE — Progress Notes (Signed)
[facility] PCCM  Brief: 82 y/o male with likely IPF followed by Dr. Lenna Gilford who was referred to Dr. Chase Caller for a second opinion was admitted on 3/5 with hypoxemia and dyspnea.  Started Esbriet three weeks prior to admission which was associated with lack of energy and poor appetite.  Notes mother with rheumatoid arthritis, no personal diagnosis.  Notes dysphagia at home.  Says symptoms started after completing steroids.  S:  Net neg 2500 in last 24 hours.  Pt reports breathing improved at rest.  Has not attempted to get up and move around much (reports last time he was able to walk on baseline 4L was "weeks ago").  Appetite improved off Esbriet.  Questions if returning to med at lower dose will help with side effects    O: Filed Vitals:   05/28/15 1559 05/28/15 2016 05/28/15 2051 05/29/15 0546  BP:   118/66 128/66  Pulse:   69 72  Temp:   97.5 F (36.4 C) 97.7 F (36.5 C)  TempSrc:   Oral Oral  Resp:   20 18  Height:      Weight:    173 lb 4.5 oz (78.6 kg)  SpO2: 93% 93% 93% 93%   4L [place]  Gen: chronically ill appearing, mildly dyspneic HENT: NCAT, OP clear, neck supple without masses Eyes: PERRL, EOMi Lymph: no cervical lymphadenopathy PULM: Crackles bases, pectus excavatum, no accessory muscle use CV: RRR, no mgr, no JVD GI: BS+, soft, nontender, no hsm Derm: no rash or skin breakdown MSK: normal bulk and tone Neuro: A&Ox4, CN II-XII intact, strength 5/5 in all 4 extremities Psyche: normal mood and affect    CBC    Component Value Date/Time   WBC 25.2* 05/28/2015 0448   RBC 5.04 05/28/2015 0448   HGB 15.1 05/28/2015 0448   HCT 45.9 05/28/2015 0448   PLT 294 05/28/2015 0448   MCV 91.1 05/28/2015 0448   MCH 30.0 05/28/2015 0448   MCHC 32.9 05/28/2015 0448   RDW 15.0 05/28/2015 0448   LYMPHSABS 1.8 05/27/2015 0541   MONOABS 0.4 05/27/2015 0541   EOSABS 0.0 05/27/2015 0541   BASOSABS 0.0 05/27/2015 0541    BMET    Component Value Date/Time   NA 133* 05/28/2015 0448   K 4.0 05/28/2015 0448   CL 97* 05/28/2015 0448   CO2 28 05/28/2015 0448   GLUCOSE 140* 05/28/2015 0448   BUN 21* 05/28/2015 0448   CREATININE 0.98 05/28/2015 0448   CALCIUM 8.5* 05/28/2015 0448   GFRNONAA >60 05/28/2015 0448   GFRAA >60 05/28/2015 0448    12/2014 CT chest images personally reviewed with peripheral honey combing and fibrotic changes and a few peripheral cyst-like changes consistent with UIP  3/4 CXR Images personally reviewed: low lung volumes, diffuse bilateral airspace disease and fibrotic changes, no pleural effusion 3/6 CXR images personally reviewed:  Underlying fibrotic changes diffusely, no effusion, ? Small improvement in bilateral densities    From Dr. Jeannine Kitten office notes:  Baseline CXR 10/2009 showed norm heart size, mild bronchitic changes, NAD   Last CXR 06/23/14 showed borderline heart size, increased interstitial markings diffusely & atx in LLL  Myoview 06/28/14 showed low risk nuclear study w/ sm area of attenuation inferiorly, norm LVF w/ EF=57%, normal wall motion, etc...  2DEcho 06/2014 showed norm LV size & function w/ EF=60-65%, no regional wall motion abn, severe focal basal septal hypertrophyGr 2 DD, mildly thickened AoV & MV leaflets w/ mild MR; RV- poorly vis...  Full PFTs done 12/14/14  showed FVC=2.28 (54%), FEV1=1.75 (58%), %1sec=77, mid-flows sl reduced at 68% predicted; after the bronchodil the FEV1 was unchanged; TLC=4.48 (61%), RV=2.18 (79%), RV/TLC=49; DLCO=47% predicted; these PFTs show a moderate restricted ventilatory defect w/ superimposed small airways disease; Diffusion is significantly reduced...   Routine LABS 9-12/2014: [place]- wnl; CBC- wnl w/ Hg=13.8; Low-T noted...   Ambulatory O2sat test 01/08/15> O2sat=95% on RA at rest w/ pulse=61/min; pt ambulated 3 laps w/ lowest O2sat=89% w/ pulse=86/min  Additional LABS & collagen-vasc screen 10/16 > Sed=17, CRP=0.6 (norm<20), ACE=29, RheumFactor<10, ANA=neg, ANCA=neg (MPO &  PR-3)...  Hi-resolution CT Chest> Done 01/16/15: Norm heart size, atherosclerotic changes, mildly enlarged mediastinal LNs (1.2-1.5cm size), no pulm nodules or emphysema, +ILD w/ patchy subpleural reticulation bilat w/ assoc traction bronchiectassis & scatered regions of honeycobing (anterior upper lobes and posterior lower lobes) w/ air trapping on expiration, min areas of GG opac noted; this is felt to be c/w UIP pathology; note: granulomatous calcif in liver & spleen, +gallstone, +DJD Tspine...   Impression: Acute on chronic respiratory failure with hypoxemia: presumably flare of underlying ILD, question some steroid dependence, could this be an autoimmune process (mother with RA)?, proBNP negative but diuresing well with lasix with subjective improvement  Diffuse parenchymal lung disease: likely UIP/IPF Dysphagia  Fatigue/anorexia on Esbriet, likely side effect, LFT negative   Plan: Change steroids (3/7) to 40 mg PO BID with taper >> 40 mg QD x1wk, 30 x 1wk, then stay on 20 mg until seen in Pulmonary office Intermittent CXR Continue bronchodilator/inhaled steroid regimen as per office/outpatient regimen Await SLP evaluation Assess Ambulatory O2 needs to determine flow rate on O2 Will need to follow up with Dr. Chase Caller post discharge (arranged).  Already has pending appt with Tammy Parrett on 3/24     Noe Gens, NP-C [facility] Pgr: [phone] or if no answer [phone] 05/29/2015, 9:16 AM

## 2015-05-29 NOTE — Discharge Instructions (Signed)
Follow with Primary MD [name], MD after  discharge from SNF  Get CBC, CMP, 2 view Chest X ray checked  by Primary MD next visit.    Activity: As tolerated with Full fall precautions use walker/cane & assistance as needed   Disposition Home    Diet: Heart Healthy  , with feeding assistance and aspiration precautions.  For Heart failure patients - Check your Weight same time everyday, if you gain over 2 pounds, or you develop in leg swelling, experience more shortness of breath or chest pain, call your Primary MD immediately. Follow Cardiac Low Salt Diet and 1.5 lit/day fluid restriction.   On your next visit with your primary care physician please Get Medicines reviewed and adjusted.   Please request your Prim.MD to go over all Hospital Tests and Procedure/Radiological results at the follow up, please get all Hospital records sent to your Prim MD by signing hospital release before you go home.   If you experience worsening of your admission symptoms, develop shortness of breath, life threatening emergency, suicidal or homicidal thoughts you must seek medical attention immediately by calling 911 or calling your MD immediately  if symptoms less severe.  You Must read complete instructions/literature along with all the possible adverse reactions/side effects for all the Medicines you take and that have been prescribed to you. Take any new Medicines after you have completely understood and accpet all the possible adverse reactions/side effects.   Do not drive, operating heavy machinery, perform activities at heights, swimming or participation in water activities or provide baby sitting services if your were admitted for syncope or siezures until you have seen by Primary MD or a Neurologist and advised to do so again.  Do not drive when taking Pain medications.    Do not take more than prescribed Pain, Sleep and Anxiety Medications  Special Instructions: If you have smoked or chewed  Tobacco  in the last 2 yrs please stop smoking, stop any regular Alcohol  and or any Recreational drug use.  Wear Seat belts while driving.   Please note  You were cared for by a hospitalist during your hospital stay. If you have any questions about your discharge medications or the care you received while you were in the hospital after you are discharged, you can call the unit and asked to speak with the hospitalist on call if the hospitalist that took care of you is not available. Once you are discharged, your primary care physician will handle any further medical issues. Please note that NO REFILLS for any discharge medications will be authorized once you are discharged, as it is imperative that you return to your primary care physician (or establish a relationship with a primary care physician if you do not have one) for your aftercare needs so that they can reassess your need for medications and monitor your lab values.

## 2015-05-29 NOTE — Clinical Social Work Placement (Signed)
   CLINICAL SOCIAL WORK PLACEMENT  NOTE  Date:  05/29/2015  Patient Details  Name: Joshua Bullock MRN: [id number] Date of Birth: 1932-03-30  Clinical Social Work is seeking post-discharge placement for this patient at the [place] level of care (*CSW will initial, date and re-position this form in  chart as items are completed):  Yes   Patient/family provided with [place] Work Department's list of facilities offering this level of care within the geographic area requested by the patient (or if unable, by the patient's family).  Yes   Patient/family informed of their freedom to choose among providers that offer the needed level of care, that participate in Medicare, Medicaid or managed care program needed by the patient, have an available bed and are willing to accept the patient.  Yes   Patient/family informed of [place]'s ownership interest in [facility] and [facility], as well as of the fact that they are under no obligation to receive care at these facilities.  PASRR submitted to EDS on 05/28/15     PASRR number received on 05/28/15     Existing PASRR number confirmed on       FL2 transmitted to all facilities in geographic area requested by pt/family on 05/28/15     FL2 transmitted to all facilities within larger geographic area on 05/28/15     Patient informed that his/her managed care company has contracts with or will negotiate with certain facilities, including the following:        Yes   Patient/family informed of bed offers received.  Patient chooses bed at [facility]     Physician recommends and patient chooses bed at      Patient to be transferred to [facility] on 05/29/15.  Patient to be transferred to facility by [facility]     Patient family notified on 05/29/15 of transfer.  Name of family member notified:  Delman Kitten     PHYSICIAN       Additional Comment:     _______________________________________________ Harlon Flor, Student-SW 05/29/2015, 2:31 PM

## 2015-05-29 NOTE — Progress Notes (Signed)
Patient is ready for dc today to [facility] SN[facility].  CSW made [facility] aware and faxed FL2 and dc summary via Epic hub.  BSW Intern met with pt and pt wife at bedside to confirm pt needs nonemergency ambulance transport.  BSW Intern called [facility] for transport. BSW Intern made RN, Raquel Sarna aware and provided number to call Report.   No further SW needs identified at this time.  BSW Intern signing off, Harlon Flor, [place] Work Department  [phone]

## 2015-05-29 NOTE — Progress Notes (Signed)
BSE completed, full report to follow.  Pt with excessive body movement - which he states has been present for many years.  No indications of aspiration or dysphagia noted with 4 crackers and water.  Choking on water x2 reported prior to admission, pt admits he was short of breath with attempting intake.    Pt denies reflux symptoms - wife reports some premorbid belching that is not worse than prior starting medication.    Pt does have whitish coating posterior palate = ? Consistent with oral candidiasis?  Advised MD to findings.    Educated pt follow aspiration precautions given respiratory issues, Heimlich maneuver and tips for xerostomia.  No follow up indicated at this time.   Luanna Salk, [facility] SLP [phone]

## 2015-05-29 NOTE — Telephone Encounter (Signed)
Pt will need hospital f/u once he is discharged, however, I can send Rx's so he doesn't run out, I just need to know which medications Pt is going to need.

## 2015-05-29 NOTE — Progress Notes (Signed)
Per MD, pt medically ready for dc today.  BSW Intern met with pt and pt wife at bedside to provide bed offers. Pt chooses bed at [facility]. [facility] notified and bed accepted.   BSW Intern to provide disposition needs when ready.  CSW continuing to follow.   Harlon Flor, [place] Intern Clinical Social Work Department  [phone]

## 2015-05-29 NOTE — Evaluation (Signed)
Clinical/Bedside Swallow Evaluation Patient Details  Name: Joshua Bullock MRN: [id number] Date of Birth: 1932/06/04  Today's Date: 05/29/2015 Time: SLP Start Time (ACUTE ONLY): 20 SLP Stop Time (ACUTE ONLY): 1158 SLP Time Calculation (min) (ACUTE ONLY): 28 min  Past Medical History:  Past Medical History  Diagnosis Date  . Hyperlipidemia   . Arthritis   . COPD (chronic obstructive pulmonary disease) ([place]) 05/09/2011    Former smoker, fine crackles at bases on exam. PFTs 2012 documented mild-moderate dz    . Hypogonadism male 09/01/2012  . DECREASED HEARING 11/05/2009    Qualifier: Diagnosis of  By: Marijean Niemann CMA, Danielle    . OSTEOPENIA 11/15/2007    Per DEXA 2008    . HYPERGLYCEMIA 05/17/2008    Qualifier: Diagnosis of  By: Larose Kells MD, Alda Berthold Sleep apnea    Past Surgical History:  Past Surgical History  Procedure Laterality Date  . Tonsillectomy  1955  . Eye surgery Bilateral 2016    Cataract  . Rotator cuff repair      right   HPI:  82 yo male adm to [facility] with acute respiratory deficits with hypoxia/hypercarbia.  PMH + for pulmonary fibrosis, gait problems, cardiac issues.  Pt admits to choking on water x2 prior to admission - stating this was isolated occurence and happened when he was dypsneic.  He denies any diffiuculties in the hospital  and states his intake has been great.  Pt also denies any reflux symptoms.  Swallow evaluation ordered by Md.     Assessment / Plan / Recommendation Clinical Impression  Pt with functional oropharyngeal swallow ability based on clinical swallow evaluation.  CN exam unremarkable - pt appears with ? extrapyramidal motor movement which he states is anxiety related and has been ongoing for years.   Appearance of whitish coating on posterior anterior faucial pillars, pt denies odynophagia = rinsing and expectoration nor po intake cleared coating. = ? If this is consistent with oral candidiasis.  Informed MD after evaluation.   Observed pt  consuming water and 4 graham crackers without difficulties.   Timely swallow observed with no complaint of dysphagia nor indication of airway compromise.  Wife reports pt consumed multiple pills at one time with liquids.    Pt attributed poor intake prior to admit due to recent medication change but states this has resolved.    Pt states incidents with choking on water x prior to admit occurred when he was dyspenic and he denies all further episodes.     All education completed to mitigate episodic aspiration risk and compensate for xerostomia.   If MD desires to rule out overt silent aspiration, MBS can be performed - otherwise no follow up.  Thanks for this consult.      Aspiration Risk  Mild aspiration risk    Diet Recommendation Regular;Thin liquid   Liquid Administration via: Cup;Straw Medication Administration: Whole meds with liquid Supervision: Patient able to self feed Compensations: Slow rate;Small sips/bites (start meals with liquids due to xerostomia) Postural Changes: Seated upright at 90 degrees;Remain upright for at least 30 minutes after po intake    Other  Recommendations Oral Care Recommendations: Oral care BID   Follow up Recommendations  None    Frequency and Duration            Prognosis        Swallow Study   General Date of Onset: 05/29/15 HPI: 82 yo male adm to [facility] with acute respiratory deficits with hypoxia/hypercarbia.  PMH + for pulmonary fibrosis, gait problems, cardiac issues.  Pt admits to choking on water x2 prior to admission - stating this was isolated occurence and happened when he was dypsneic.  He denies any diffiuculties in the hospital  and states his intake has been great.  Pt also denies any reflux symptoms.  Swallow evaluation ordered by Md.   Type of Study: Bedside Swallow Evaluation Diet Prior to this Study: Regular;Thin liquids Temperature Spikes Noted: No Respiratory Status: Nasal cannula History of Recent Intubation:  No Behavior/Cognition: Alert;Cooperative;Pleasant mood Oral Cavity Assessment: Other (comment) (appearance of whitish coating - posterior anterior faucial pillars, pt denies odynophagia = rinsing and expectoration nor intake cleared coating) Oral Care Completed by SLP: Other (Comment) (assisted to have pt rinse and expectorate with water to aid clearance) Oral Cavity - Dentition: Dentures, top;Dentures, bottom Vision: Functional for self-feeding Self-Feeding Abilities: Able to feed self Patient Positioning: Upright in bed Baseline Vocal Quality: Normal Volitional Cough: Strong (nonproductive cough) Volitional Swallow: Able to elicit    Oral/Motor/Sensory Function Overall Oral Motor/Sensory Function: Within functional limits   Ice Chips Ice chips: Not tested   Thin Liquid Thin Liquid: Within functional limits Presentation: Straw;Cup    Nectar Thick Nectar Thick Liquid: Not tested   Honey Thick Honey Thick Liquid: Not tested   Puree Puree: Not tested   Solid   GO   Solid: Within functional limits Presentation: Self Fed Other Comments: pt consumed 4 crackers with no indications of residuals nor airway compromise        Claudie Fisherman, [place] [facility] SLP [phone]

## 2015-05-29 NOTE — Care Management Important Message (Signed)
Important Message  Patient Details  Name: Joshua Bullock MRN: [id number] Date of Birth: 1932/05/23   Medicare Important Message Given:  Yes    Camillo Flaming 05/29/2015, 2:29 [place] Message  Patient Details  Name: Joshua Bullock MRN: [id number] Date of Birth: [birth year]   Medicare Important Message Given:  Yes    Camillo Flaming 05/29/2015, 2:29 PM

## 2015-05-30 ENCOUNTER — Telehealth: Payer: Self-pay | Admitting: Internal Medicine

## 2015-05-30 NOTE — Telephone Encounter (Signed)
Spoke with Mrs. Joshua Bullock) she says that since we last spoke pt has been admitted to [facility] facility. She says that she isn't able to bring pt to appt's because pt isn't mobile and she can barely assist him. She says that she need in home care for pt.   Please advise further.   CB: [phone]

## 2015-05-30 NOTE — Telephone Encounter (Signed)
Called and spoke with Cecille Rubin at [facility]. She states that she spoke with the pt's wife and discussed future refills. The pt's wife request that the patient's number be placed out of Lori's call list. I informed Cecille Rubin that I would discuss this with the pt and return her call.   Called and spoke with the p's wifet. Pt's wife states that the patient is currently at rehab since discharge from the hospital on 05/29/15. She states that on his discharge paperwork from the hospital it stated that he is to hold the esbriet for now due to loss of appetite and limited mobility and that once the pt sees MR on 06/22/15 they will decide whether to restart Esbriet. She also states that she called and LM with a nurse to have MR call her to discuss the pt's current situation but never received a call back.She states that she wants MR to know that she is not pleased that she did not receive the call back. I assured her that I would send the message to MR and make him aware of the current situation.She voiced understanding and had no further questions.   Called and spoke with Cecille Rubin. Explained the above and that a message with be sent to MR. She voiced understanding and had no further questions.   FYI to MR

## 2015-05-30 NOTE — Telephone Encounter (Signed)
Please advise 

## 2015-05-31 ENCOUNTER — Encounter (HOSPITAL_COMMUNITY): Payer: Medicare Other

## 2015-05-31 NOTE — Telephone Encounter (Signed)
D/w wife - >   1) reports that within 3 weeks of starting esbriet -> severe weight loss -> they do not want to do esbriet anymore. She has canceled it but they keep calling. Pls call pharmacy and cancel it  2) physical deconditioning -> in bed in SNF. Says patient unrecognizable. Focus now quality of life.   She is upset that I did not call to discuss above issues on 05/28/15  3) Also, apparently PCP [name], MD wants to know if there is fever. HE had called but wife unable to get back to Dr Larose Kells. She wanted me to relay to Dr Larose Kells our impression that fibrosis has advanced. I will copy him on this message.   4) She will keep appt end of month if she can have him in good shape for appt. She resolved she will still keep me as primary pulmonary and accepted apology  Dr. Brand Males, M.D., [facility].C.P Pulmonary and Critical Care Medicine Staff Physician [place] [facility] Pager: [phone], If no answer or between  15:00h - 7:00h: call [phone]  05/31/2015 1:38 PM

## 2015-05-31 NOTE — Telephone Encounter (Signed)
MR requested what rehab facility pt is in. Called and spoke to pt's wife, Mrs. Eichmann. Mrs. Brenneke states the pt is at [facility] facility on [address].   Will send to MR as FYI.

## 2015-05-31 NOTE — Telephone Encounter (Signed)
Esbriet not in pt's medication list- per previous phone notes it looks like pt was receiving esbriet through [facility].  Called [facility] and was forwarded to the specialty pharmacy [facility].  Per pharmacist, the esbriet rx has been cancelled.  Nothing further needed.

## 2015-05-31 NOTE — Telephone Encounter (Signed)
BCX reviewed, G+ rods, s/p levaquin x 3 days , called pt's wife, asked for a call back

## 2015-05-31 NOTE — Telephone Encounter (Addendum)
Advise pt's wife. Before he is d/c to home , we need to know what his needs will be (nebulizers, RXs, HH, etc) so we can help set that up

## 2015-06-01 ENCOUNTER — Telehealth: Payer: Self-pay | Admitting: Internal Medicine

## 2015-06-01 ENCOUNTER — Ambulatory Visit: Payer: Medicare Other | Admitting: Internal Medicine

## 2015-06-01 LAB — CULTURE, BLOOD (ROUTINE X 2): Culture: NO GROWTH

## 2015-06-01 NOTE — Telephone Encounter (Signed)
Called and spoke to [place] at [facility]. Advised her of the recs per MR. Blain Pais states she will inform the MD at rehab and get the pt the prednisone and they will consult the MD at rehab about the Morphine dosage. Pt is also scheduled to see MR on Tuesday 3.14.17 AM. Joshua Bullock verbalized understanding and denied any further questions or concerns at this time.   Will send to MR as FYI.

## 2015-06-01 NOTE — Telephone Encounter (Signed)
Kenney Houseman from [facility] calling to see if she can get earlier appointment for patient to see MR.  Kenney Houseman says that patient is fine when he is in the bed resting, but if he gets up at all his oxygen will drop to low 80's. Daniel, pt's rehab nurse, states that patient gets very anxious when oxygen decreases and will start to hyperventilate.  He says that he tried to transfer patient to wheelchair this morning and he immediately dropped from 93% on 5L to 81%.  Quillian Quince states that patient can start talking while lying in bed and his O2 will drop if he talks for a long period of time. I advised them that patient needs to go to ER.  Tonya from [place] states that she wants to know what Dr. Golden Pop recommendations are because they do not want to send patient to ER.  She says that patient is not in distress when he is lying in bed, however, Daniel, pt's nurse, states that when patient is talking, his oxygen drops and he gets anxious because his oxygen drops. She scheduled appointment for patient to come see Dr. Guadalupe Dawn on Tuesday, however, I advised her that I would speak with Dr. Chase Caller and see if he agrees that patient needs to go to ER.   Dr. Chase Caller, please advise.

## 2015-06-01 NOTE — Telephone Encounter (Signed)
Patient likely has severe flare up in IPF and sometimes this can be an event with high mortality. I know they have him on steroids. One option is to increase prednisone for 60mg  per day for next 5days and reasess.   THey should also consider morphine for dyspnea relief.   They can accept pulse of 80% if he is asymptmatic  Can you get him in next week to see me? How is my schedule. Work with [employer]

## 2015-06-01 NOTE — Telephone Encounter (Signed)
Spoke with pulmonary DOD ref  +blood Cx, is  likely a contaminant. I spoke with the patient's wife, he does not have fever but is doing poorly from the pulmonary standpoint, no improvement since he left the hospital. This has already been addressed by Dr. Chase Caller. Advise ER should condition deteriorate.

## 2015-06-04 ENCOUNTER — Encounter (HOSPITAL_COMMUNITY): Payer: Self-pay

## 2015-06-04 ENCOUNTER — Emergency Department (HOSPITAL_COMMUNITY): Payer: Medicare Other

## 2015-06-04 ENCOUNTER — Inpatient Hospital Stay (HOSPITAL_COMMUNITY)
Admission: EM | Admit: 2015-06-04 | Discharge: 2015-06-09 | DRG: 308 | Disposition: A | Discharge status: Hospice | Payer: Medicare Other | Attending: Internal Medicine | Admitting: Internal Medicine

## 2015-06-04 DIAGNOSIS — E785 Hyperlipidemia, unspecified: Secondary | ICD-10-CM | POA: Diagnosis present

## 2015-06-04 DIAGNOSIS — J449 Chronic obstructive pulmonary disease, unspecified: Secondary | ICD-10-CM | POA: Diagnosis present

## 2015-06-04 DIAGNOSIS — Z7901 Long term (current) use of anticoagulants: Secondary | ICD-10-CM

## 2015-06-04 DIAGNOSIS — Z87891 Personal history of nicotine dependence: Secondary | ICD-10-CM

## 2015-06-04 DIAGNOSIS — Z8249 Family history of ischemic heart disease and other diseases of the circulatory system: Secondary | ICD-10-CM | POA: Diagnosis not present

## 2015-06-04 DIAGNOSIS — Z7401 Bed confinement status: Secondary | ICD-10-CM

## 2015-06-04 DIAGNOSIS — G4733 Obstructive sleep apnea (adult) (pediatric): Secondary | ICD-10-CM | POA: Diagnosis present

## 2015-06-04 DIAGNOSIS — Z9981 Dependence on supplemental oxygen: Secondary | ICD-10-CM | POA: Diagnosis not present

## 2015-06-04 DIAGNOSIS — Z888 Allergy status to other drugs, medicaments and biological substances status: Secondary | ICD-10-CM

## 2015-06-04 DIAGNOSIS — Z66 Do not resuscitate: Secondary | ICD-10-CM | POA: Diagnosis present

## 2015-06-04 DIAGNOSIS — K5901 Slow transit constipation: Secondary | ICD-10-CM | POA: Insufficient documentation

## 2015-06-04 DIAGNOSIS — J84112 Idiopathic pulmonary fibrosis: Secondary | ICD-10-CM | POA: Diagnosis present

## 2015-06-04 DIAGNOSIS — F411 Generalized anxiety disorder: Secondary | ICD-10-CM | POA: Diagnosis present

## 2015-06-04 DIAGNOSIS — R0902 Hypoxemia: Secondary | ICD-10-CM | POA: Insufficient documentation

## 2015-06-04 DIAGNOSIS — I480 Paroxysmal atrial fibrillation: Secondary | ICD-10-CM | POA: Diagnosis present

## 2015-06-04 DIAGNOSIS — Z7982 Long term (current) use of aspirin: Secondary | ICD-10-CM | POA: Diagnosis not present

## 2015-06-04 DIAGNOSIS — R0600 Dyspnea, unspecified: Secondary | ICD-10-CM | POA: Insufficient documentation

## 2015-06-04 DIAGNOSIS — Z7189 Other specified counseling: Secondary | ICD-10-CM | POA: Diagnosis not present

## 2015-06-04 DIAGNOSIS — J9621 Acute and chronic respiratory failure with hypoxia: Secondary | ICD-10-CM | POA: Insufficient documentation

## 2015-06-04 DIAGNOSIS — J9601 Acute respiratory failure with hypoxia: Secondary | ICD-10-CM | POA: Diagnosis not present

## 2015-06-04 DIAGNOSIS — Z515 Encounter for palliative care: Secondary | ICD-10-CM | POA: Insufficient documentation

## 2015-06-04 DIAGNOSIS — J8410 Pulmonary fibrosis, unspecified: Secondary | ICD-10-CM | POA: Diagnosis not present

## 2015-06-04 DIAGNOSIS — I739 Peripheral vascular disease, unspecified: Secondary | ICD-10-CM | POA: Diagnosis present

## 2015-06-04 DIAGNOSIS — E873 Alkalosis: Secondary | ICD-10-CM | POA: Diagnosis not present

## 2015-06-04 DIAGNOSIS — Z887 Allergy status to serum and vaccine status: Secondary | ICD-10-CM

## 2015-06-04 DIAGNOSIS — Z79899 Other long term (current) drug therapy: Secondary | ICD-10-CM

## 2015-06-04 DIAGNOSIS — I4892 Unspecified atrial flutter: Principal | ICD-10-CM

## 2015-06-04 DIAGNOSIS — J849 Interstitial pulmonary disease, unspecified: Secondary | ICD-10-CM | POA: Diagnosis not present

## 2015-06-04 DIAGNOSIS — R001 Bradycardia, unspecified: Secondary | ICD-10-CM | POA: Diagnosis not present

## 2015-06-04 HISTORY — DX: Unspecified atrial fibrillation: I48.91

## 2015-06-04 HISTORY — DX: Pulmonary fibrosis, unspecified: J84.10

## 2015-06-04 LAB — CBC WITH DIFFERENTIAL/PLATELET
BASOS PCT: 0 %
Basophils Absolute: 0 10*3/uL (ref 0.0–0.1)
Eosinophils Absolute: 0 10*3/uL (ref 0.0–0.7)
Eosinophils Relative: 0 %
HEMATOCRIT: 48.3 % (ref 39.0–52.0)
HEMOGLOBIN: 16.3 g/dL (ref 13.0–17.0)
Lymphocytes Relative: 8 %
Lymphs Abs: 2 10*3/uL (ref 0.7–4.0)
MCH: 30 pg (ref 26.0–34.0)
MCHC: 33.7 g/dL (ref 30.0–36.0)
MCV: 89 fL (ref 78.0–100.0)
MONOS PCT: 4 %
Monocytes Absolute: 0.9 10*3/uL (ref 0.1–1.0)
NEUTROS ABS: 21.1 10*3/uL — AB (ref 1.7–7.7)
NEUTROS PCT: 88 %
Platelets: 264 10*3/uL (ref 150–400)
RBC: 5.43 MIL/uL (ref 4.22–5.81)
RDW: 15.4 % (ref 11.5–15.5)
WBC: 24.1 10*3/uL — AB (ref 4.0–10.5)

## 2015-06-04 LAB — I-STAT CHEM 8, ED
BUN: 38 mg/dL — AB (ref 6–20)
CHLORIDE: 93 mmol/L — AB (ref 101–111)
Calcium, Ion: 1.02 mmol/L — ABNORMAL LOW (ref 1.13–1.30)
Creatinine, Ser: 1.1 mg/dL (ref 0.61–1.24)
Glucose, Bld: 124 mg/dL — ABNORMAL HIGH (ref 65–99)
HEMATOCRIT: 56 % — AB (ref 39.0–52.0)
Hemoglobin: 19 g/dL — ABNORMAL HIGH (ref 13.0–17.0)
Potassium: 5.3 mmol/L — ABNORMAL HIGH (ref 3.5–5.1)
SODIUM: 133 mmol/L — AB (ref 135–145)
TCO2: 31 mmol/L (ref 0–100)

## 2015-06-04 LAB — I-STAT TROPONIN, ED: TROPONIN I, POC: 0.05 ng/mL (ref 0.00–0.08)

## 2015-06-04 MED ORDER — MOMETASONE FURO-FORMOTEROL FUM 200-5 MCG/ACT IN AERO
2.0000 | INHALATION_SPRAY | Freq: Two times a day (BID) | RESPIRATORY_TRACT | Status: DC
  Filled 2015-06-04: qty 8.8

## 2015-06-04 MED ORDER — PREDNISONE 10 MG (21) PO TBPK: 10.0000 mg | ORAL_TABLET | Freq: Every day | ORAL | Status: DC

## 2015-06-04 MED ORDER — AZELASTINE HCL 0.1 % NA SOLN
2.0000 | Freq: Two times a day (BID) | NASAL | Status: DC
Start: 2015-06-04 — End: 2015-06-09
  Administered 2015-06-06 – 2015-06-09 (×7): 2 via NASAL
  Filled 2015-06-04: qty 30

## 2015-06-04 MED ORDER — FLECAINIDE ACETATE 100 MG PO TABS
300.0000 mg | ORAL_TABLET | Freq: Once | ORAL | Status: AC
  Administered 2015-06-04: 300 mg via ORAL
  Filled 2015-06-04: qty 3

## 2015-06-04 MED ORDER — DILTIAZEM LOAD VIA INFUSION
10.0000 mg | Freq: Once | INTRAVENOUS | Status: AC
  Administered 2015-06-04: 10 mg via INTRAVENOUS

## 2015-06-04 MED ORDER — DEXTROSE 5 % IV SOLN
5.0000 mg/h | Freq: Once | INTRAVENOUS | Status: AC
  Administered 2015-06-04: 12.5 mg/h via INTRAVENOUS
  Filled 2015-06-04: qty 100

## 2015-06-04 MED ORDER — CITALOPRAM HYDROBROMIDE 20 MG PO TABS
20.0000 mg | ORAL_TABLET | Freq: Every day | ORAL | Status: DC
  Administered 2015-06-05 – 2015-06-09 (×5): 20 mg via ORAL
  Filled 2015-06-04 (×3): qty 1
  Filled 2015-06-04: qty 2
  Filled 2015-06-04: qty 1

## 2015-06-04 MED ORDER — PRESERVISION AREDS 2 PO CAPS: 2.0000 | ORAL_CAPSULE | Freq: Two times a day (BID) | ORAL | Status: DC

## 2015-06-04 MED ORDER — PANTOPRAZOLE SODIUM 40 MG PO TBEC
40.0000 mg | DELAYED_RELEASE_TABLET | Freq: Every day | ORAL | Status: DC
  Administered 2015-06-05 – 2015-06-09 (×5): 40 mg via ORAL
  Filled 2015-06-04 (×5): qty 1

## 2015-06-04 MED ORDER — FLUTICASONE PROPIONATE 50 MCG/ACT NA SUSP
1.0000 | Freq: Every day | NASAL | Status: DC
  Administered 2015-06-06 – 2015-06-09 (×4): 1 via NASAL
  Filled 2015-06-04: qty 16

## 2015-06-04 MED ORDER — SODIUM POLYSTYRENE SULFONATE 15 GM/60ML PO SUSP
15.0000 g | Freq: Once | ORAL | Status: AC
  Administered 2015-06-04: 15 g via ORAL
  Filled 2015-06-04: qty 60

## 2015-06-04 MED ORDER — ADULT MULTIVITAMIN W/MINERALS CH
1.0000 | ORAL_TABLET | Freq: Every day | ORAL | Status: DC
  Administered 2015-06-05 – 2015-06-09 (×5): 1 via ORAL
  Filled 2015-06-04 (×5): qty 1

## 2015-06-04 MED ORDER — PRAVASTATIN SODIUM 40 MG PO TABS
40.0000 mg | ORAL_TABLET | Freq: Every day | ORAL | Status: DC
  Administered 2015-06-05 – 2015-06-08 (×4): 40 mg via ORAL
  Filled 2015-06-04 (×4): qty 1

## 2015-06-04 MED ORDER — APIXABAN 5 MG PO TABS
5.0000 mg | ORAL_TABLET | Freq: Two times a day (BID) | ORAL | Status: DC
  Administered 2015-06-04 – 2015-06-08 (×8): 5 mg via ORAL
  Filled 2015-06-04 (×9): qty 1

## 2015-06-04 MED ORDER — DILTIAZEM HCL 100 MG IV SOLR
5.0000 mg/h | Freq: Once | INTRAVENOUS | Status: AC
  Administered 2015-06-04: 5 mg/h via INTRAVENOUS
  Filled 2015-06-04: qty 100

## 2015-06-04 MED ORDER — DILTIAZEM HCL 100 MG IV SOLR
5.0000 mg/h | Freq: Once | INTRAVENOUS | Status: DC
  Filled 2015-06-04: qty 100

## 2015-06-04 MED ORDER — DILTIAZEM HCL 25 MG/5ML IV SOLN
10.0000 mg | Freq: Once | INTRAVENOUS | Status: AC
  Administered 2015-06-04: 10 mg via INTRAVENOUS
  Filled 2015-06-04: qty 5

## 2015-06-04 MED ORDER — ASPIRIN EC 81 MG PO TBEC
81.0000 mg | DELAYED_RELEASE_TABLET | Freq: Every day | ORAL | Status: DC
  Administered 2015-06-05: 81 mg via ORAL
  Filled 2015-06-04: qty 1

## 2015-06-04 NOTE — ED Notes (Signed)
Pt arrives EMS with shob and Tachy. Pt given Adenosine 6 MG then 12 MG without rhythm change. Pt  Given cardizem 20 mg with HR decreased to 80 in route. HR 120 on arrival.

## 2015-06-04 NOTE — ED Notes (Signed)
Patient given food tray moving to sit up and eat heart rate increased. Cardiology at bedside.

## 2015-06-04 NOTE — ED Notes (Signed)
Pt sats at 88% on 5 L O2; Respiratory and Md notified

## 2015-06-04 NOTE — ED Notes (Signed)
Md ok'd pt to have sips of water for PO meds due to NPO order entered

## 2015-06-04 NOTE — H&P (Addendum)
Note: The patient has 2 medical record charts which are marked for merger. The majority of his past medical history/cardiac records is on file under the medical record number [id number]   Patient ID: Joshua Bullock MRN: [id number],  DOB/AGE: [birth year]   Admit date: 06/04/2015   Primary Physician: No primary care provider on file. Primary Cardiologist: Dr. Debara Pickett  Pt. Profile:  82 year old male with known history of paroxysmal atrial fibrillation on chronic anticoagulation therapy with Eliquis, as well as a history of COPD/idiopathic pulmonary fibrosis, followed by Dr. Chase Caller, who presents to the [facility] ED with worsening dyspnea in the setting of rapid atrial flutter.  Problem List  Past Medical History  Diagnosis Date  . Atrial fibrillation ([place])   . Pulmonary fibrosis [facility])     Past Surgical History  Procedure Laterality Date  . Tonsillectomy       Allergies  Allergies  Allergen Reactions  . Influenza Vaccines Anaphylaxis  . Tetanus Toxoids Anaphylaxis    HPI  82 y/o male with a documented PMH of atrial fibrillation and pulmonary fibrosis, presenting to the [facility] ED with complaint of dyspnea and rapid HR, found to be in atrial flutter w/ RVR.   His atrial fibrillation was first diagnosed 06/23/2014. He initially presented to his PCP office then with complaint of weakness and fatigue. At PCP office, he was found to be in AF with RVR. Sent to ER. In ER, ECG with AF with v-rate of 145. No ST-T changes. Troponin 0.08. Denied CP or HF symptoms. Started on diltiazem gtt and dropped rate down to about 100. Then given 50 mg of po lopressor and HR dropped into 40s transiently and SBP into 70s. Diltiazem gtt stopped. Pt admitted to step down. CHA2DS2VASc =2 and he was started on Eliquis. He was given one dose of Flecainide in the ED. He was hypotensive and required Neosynephrine for a few hours. He converted to sinus bradycardia. Echo done 06/24/14 revealed an EF of 60-65% with  grade 2 diastolic dysfunction.  On follow-up, he was seen in the A. fib clinic by Roderic Palau, NP, and his aspirin was stopped. He underwent a nuclear stress test which was negative for ischemia. He was seen by Dr. Debara Pickett 12/2014 and he added back ASA and he felt that he likely had underlying small vessel disease (see office note for explanation).  His pulmonary fibrosis was also recently diagnosed in December 2016. This is felt to be idiopathic. This is followed by Dr. Chase Caller. He did undergo a sleep study that was negative for OSA.   He was recently admitted to [facility] March 4 through March 7 after presenting with worsening shortness of breath, and not feeling well, associated with hypoxia, and he is found to have acute exacerbation of Interstitial Lung disease causing acute on chronic hypercapnic, hypoxic respiratory failure likely related to superimposed pneumonia. He was admitted by internal medicine and treated with antibiotics, steroids and bronchodilators. EKGs that admission showed that he was in NSR.  He was discharged to a SNF for temporary rehabilitation. He and his wife both report that since discharge from the hospital last week he has remained in bed as he has continued to feel very weak. He continued to have chronic dyspnea, remaining on 4 L nasal cannula. However earlier today around noon, he developed increased dyspnea associated with a rapid heart rate. EMS was called to his rehabilitation facility where he was found to be in rapid atrial flutter.  Per records, EMS gave  patient Adenosine 6 MG then 12 MG without rhythm change. Pt Given cardizem 20 mg with HR decreased to 80 in route. HR in the ED increased again into the 120s-150s. He was given additional IV Cardizem boluses, 10 mg 2 and is now on IV Cardizem drip. His heart rate has improved some into the 90s to low 100s. He remains in atrial flutter. His symptoms are somewhat improved. He denies associated chest pain or  pressure. No syncope/ near syncope. He denies fever, chills, nausea, vomiting or diarrhea. Labs are notable for mild hyperkalemia with K of 5.3. WBC is elevated at 24.1.  CXR shows chronic changes of pulmonary fibrosis. No obvious acute overlying infiltrate or edema. No effusions.     Home Medications  Prior to Admission medications   Not on File    Family History  Family History  Problem Relation Age of Onset  . Hypertension Father     Social History  Social History   Social History  . Marital Status: Unknown    Spouse Name: N/A  . Number of Children: N/A  . Years of Education: N/A   Occupational History  . Not on file.   Social History Main Topics  . Smoking status: Former Research scientist (life sciences)  . Smokeless tobacco: Not on file  . Alcohol Use: No  . Drug Use: No  . Sexual Activity: Not on file   Other Topics Concern  . Not on file   Social History Narrative  . No narrative on file     Review of Systems General:  No chills, fever, night sweats or weight changes.  Cardiovascular:  No chest pain, + dyspnea on exertion, no edema, orthopnea, + palpitations, no paroxysmal nocturnal dyspnea. Dermatological: No rash, lesions/masses Respiratory: No cough, + dyspnea Urologic: No hematuria, dysuria Abdominal:   No nausea, vomiting, diarrhea, bright red blood per rectum, melena, or hematemesis Neurologic:  No visual changes, wkns, changes in mental status. All other systems reviewed and are otherwise negative except as noted above.  Physical Exam  Blood pressure 110/66, pulse 62, resp. rate 22, height 5\' 10"  (1.778 m), weight 179 lb (81.194 kg), SpO2 100 %.  General: Pleasant, NAD Psych: Normal affect. Neuro: Alert and oriented X 3. Moves all extremities spontaneously. HEENT: Normal  Neck: Supple without bruits or JVD. Lungs:  Diffuse crackles bilaterally consistent with pulmonary fibrosis Heart: Irregular rhythm, tachycardia rate no s3, s4, or murmurs. Abdomen: Soft,  non-tender, non-distended, BS + x 4.  Extremities: No clubbing, cyanosis or edema. DP/PT/Radials 2+ and equal bilaterally.  Labs  Troponin [facility] of Care Test)  Recent Labs  06/04/15 1519  TROPIPOC 0.05   No results for input(s): CKTOTAL, CKMB, TROPONINI in the last 72 hours. Lab Results  Component Value Date   WBC 24.1* 06/04/2015   HGB 19.0* 06/04/2015   HCT 56.0* 06/04/2015   MCV 89.0 06/04/2015   PLT 264 06/04/2015     Recent Labs Lab 06/04/15 1521  NA 133*  K 5.3*  CL 93*  BUN 38*  CREATININE 1.10  GLUCOSE 124*   No results found for: CHOL, HDL, LDLCALC, TRIG No results found for: DDIMER   Radiology/Studies  Dg Chest Port 1 View  06/04/2015  CLINICAL DATA:  Shortness of breath and tachycardia. History of pulmonary fibrosis. EXAM: PORTABLE CHEST 1 VIEW COMPARISON:  Multiple prior chest x-rays. FINDINGS: The heart is within normal limits in size given the AP projection of the film. The mediastinal and hilar contours are grossly within normal limits.  Changes of chronic pulmonary fibrosis with low lung volumes. No obvious acute overlying infiltrate or edema. No large pleural effusion. No pneumothorax. The bony thorax is intact. IMPRESSION: Chronic changes of pulmonary fibrosis. Low lung volumes with vascular crowding and atelectasis. Electronically Signed   By: Marijo Sanes M.D.   On: 06/04/2015 15:50    ECG  Atrial fibrillation with rapid ventricular response, rate in the 150s    ASSESSMENT AND PLAN  Principal Problem:   Atrial flutter with rapid ventricular response (HCC) Active Problems:   Pulmonary fibrosis (HCC)   Interstitial lung disease (HCC)   PAF (paroxysmal atrial fibrillation) (HCC)   Chronic anticoagulation - Eliquis   1. Atrial flutter with RVR: Patient with known history of paroxysmal atrial fibrillation, on chronic anticoagulation with Eliquis. Recently hospitalized one week ago for hypoxic respiratory failure in the setting of worsening  interstitial lung disease. Per records and review of EKGs he was in normal sinus rhythm at that time. He developed increased dyspnea earlier this morning associated with tachypalpitations. He remains in atrial flutter however his rate has improved some with IV Cardizem. Labs show elevated white count of 24 however he is on prednisone. CXR is without infiltrate and edema. K is 5.3.  Initial troponin is negative.   Recommend rhythm control as atrial flutter is very difficult to rate control. Given his ILD, cannot treat with amiodarone. Per records, he responded well to flecainide in the past. Recommend giving one-time dose of 300 mg of flecainide and monitoring in the ED for 4 hours for pro-arrhythmias. If he successfully converts and has no complications, he could be discharged back to skilled nursing facility for rehabilitation with outpatient follow-up in the A. fib clinic.  Continue Eliquis for a/c.   Signed, Lyda Jester, PA-C 06/04/2015, 4:53 PM  I have seen and examined the patient along with SIMMONS, BRITTAINY, PA-C.  I have reviewed the chart, notes and new data.  I agree with PA's note.  Key new complaints: remains weak, dyspneic Key examination changes: now in atrial flutter with variable AV block, rate approx. 100, readily increases to 130s with movement. Widespread bilateral Velcro like lung crackles. Key new findings / data: K 5.2, Hgb >19  PLAN: He will be very difficult to manage with rate control. Prefer early cardioversion /rhythm control. Not a great candidate for electrical cardioversion: high risk for respiratory complications due to advanced lung disease. Last year had successful chemical cardioversion with flecainide. Will try a similar approach today. In fact, he has a "pill in the pocket" prescription for flecainide at home (but he came here from SNF). Flecainide 300 mg PO and observe for 4 hours. Continue diltiazem after flecainide is given, since AV conduction and  ventricular rate may actually increase as flutter cycle length slows. If he converts to sinus rhythm, stop diltiazem. If he does not, admit to the hospital and consider DCCV with anesthesiology support. Prognosis is poor due to advanced lung disease. I asked him about code status. Both the patient and his wife stated he does NOT want to be intubated.  Sanda Klein, MD, [place] [phone] 06/04/2015, 5:43 PM  ADDENDUM Pt is an 82 yo M w/ a PMH significant for afib/flutter and IPF p/w acute on chronic fatigue/DOE found to be in aflutter. Seen by cards consult who recommended pharmacologic cardioversion with flecainide and continuing dilt gtt. Pt has not converted to sinus. Will need admission. Difficult to rate control and will likely require rhythm control strategy as highly  symptomatic. However, pulmonary status will complicate any potential procedure. Plan: 1.) Admit to tele 2.) Continue dilt gtt as increased cycle length 2/2 flecainide can paradoxically increase AV conduction and ventricular rate 3.) Continue NOAC for primary stroke prevention 4.) Would discuss DCCV vs. CTI ablation per above  Jay Schlichter, MD

## 2015-06-04 NOTE — ED Notes (Signed)
Patient finished eating.

## 2015-06-04 NOTE — ED Provider Notes (Signed)
Care received from Dr. Cathleen Fears. Patient reexamined on multiple occasions. Continues to have atrial flutter with variable rate, and variable block. Continues on Cardizem. Hemolytically stable and well oxygenated. Discussed with Dr. Candyce Churn. After greater than 4 hours patient has had no conversion remains symptomatic. Require admission for consideration for possible DC cardioversion.  Tanna Furry, MD 06/04/15 2232

## 2015-06-04 NOTE — ED Notes (Signed)
Heart rate increased to 153

## 2015-06-04 NOTE — ED Notes (Signed)
Cardiology at bedside.

## 2015-06-04 NOTE — ED Notes (Signed)
Joshua Bullock Brother of patient cell phone [phone].

## 2015-06-04 NOTE — ED Provider Notes (Addendum)
CSN: [id number]     Arrival date & time 06/04/15  1431 History   First MD Initiated Contact with Patient 06/04/15 1442     Chief Complaint  Patient presents with  . Shortness of Breath  . Tachycardia   Level V caveat unstable vital signs  (Consider location/radiation/quality/duration/timing/severity/associated sxs/prior Treatment) HPI [place] of shortness of breath chronic becoming worse today approximately 12 noon a [place] by racing heartbeat. He was noted by EMS to be in atrial flutter. EMS treated patient with adenosine and with Cardizem, without relief. He denies chest pain denies other associated symptoms Past Medical History  Diagnosis Date  . Atrial fibrillation ([place])   . Pulmonary fibrosis [facility])    Past Surgical History  Procedure Laterality Date  . Tonsillectomy     No family history on file. Social History  Substance Use Topics  . Smoking status: Former Research scientist (life sciences)  . Smokeless tobacco: Not on file  . Alcohol Use: No    Review of Systems  Unable to perform ROS: Unstable vital signs  Constitutional: Negative.   HENT: Negative.   Respiratory: Positive for shortness of breath.   Cardiovascular: Positive for palpitations.  Gastrointestinal: Negative.   Musculoskeletal: Negative.   Skin: Negative.   Neurological: Negative.       Allergies  Influenza vaccines and Tetanus toxoids  Home Medications   Prior to Admission medications   Not on File   BP 111/80 mmHg  Pulse 155  Resp 40  Ht 5\' 10"  (1.778 m)  Wt 179 lb (81.194 kg)  BMI 25.68 kg/m2  SpO2 97% Physical Exam  Constitutional:  Chronically ill-appearing in moderate respiratory distress  HENT:  Head: Normocephalic and atraumatic.  Eyes: Conjunctivae are normal. Pupils are equal, round, and reactive to light.  Neck: Neck supple. No tracheal deviation present. No thyromegaly present.  Cardiovascular: Regular rhythm.   No murmur heard. Tachycardic  Pulmonary/Chest: He is in respiratory distress.   Moderate respiratory distress. Positive retractions diffuse coarse rhonchi  Abdominal: Soft. Bowel sounds are normal. He exhibits no distension. There is no tenderness.  Musculoskeletal: Normal range of motion. He exhibits no edema or tenderness.  Neurological: He is alert. Coordination normal.  Skin: Skin is warm and dry. No rash noted.  Psychiatric: He has a normal mood and affect.  Nursing note and vitals reviewed.   ED Course  Procedures (including critical care time) Labs Review Labs Reviewed  CBC WITH DIFFERENTIAL/PLATELET  I-STAT CHEM 8, ED  I-STAT TROPOININ, ED    Imaging Review No results found. I have personally reviewed and evaluated these images and lab results as part of my medical decision-making.   EKG Interpretation   Date/Time:  Monday June 04 2015 14:41:39 EDT Ventricular Rate:  155 PR Interval:  98 QRS Duration: 140 QT Interval:  312 QTC Calculation: 501 R Axis:   -106 Text Interpretation:  Extreme tachycardia with wide complex, no further  rhythm analysis attempted No old tracing to compare Confirmed by  Winfred Leeds  MD, Shakari Qazi [phone]) on 06/04/2015 2:50:30 PM     chest x-ray viewed by me Results for orders placed or performed during the hospital encounter of 06/04/15  CBC with Differential/Platelet  Result Value Ref Range   WBC 24.1 (H) 4.0 - 10.5 K/uL   RBC 5.43 4.22 - 5.81 MIL/uL   Hemoglobin 16.3 13.0 - 17.0 g/dL   HCT 48.3 39.0 - 52.0 %   MCV 89.0 78.0 - 100.0 fL   MCH 30.0 26.0 - 34.0 pg  MCHC 33.7 30.0 - 36.0 g/dL   RDW 15.4 11.5 - 15.5 %   Platelets 264 150 - 400 K/uL   Neutrophils Relative % 88 %   Neutro Abs 21.1 (H) 1.7 - 7.7 K/uL   Lymphocytes Relative 8 %   Lymphs Abs 2.0 0.7 - 4.0 K/uL   Monocytes Relative 4 %   Monocytes Absolute 0.9 0.1 - 1.0 K/uL   Eosinophils Relative 0 %   Eosinophils Absolute 0.0 0.0 - 0.7 K/uL   Basophils Relative 0 %   Basophils Absolute 0.0 0.0 - 0.1 K/uL  I-stat chem 8, ed  Result Value Ref Range    Sodium 133 (L) 135 - 145 mmol/L   Potassium 5.3 (H) 3.5 - 5.1 mmol/L   Chloride 93 (L) 101 - 111 mmol/L   BUN 38 (H) 6 - 20 mg/dL   Creatinine, Ser 1.10 0.61 - 1.24 mg/dL   Glucose, Bld 124 (H) 65 - 99 mg/dL   Calcium, Ion 1.02 (L) 1.13 - 1.30 mmol/L   TCO2 31 0 - 100 mmol/L   Hemoglobin 19.0 (H) 13.0 - 17.0 g/dL   HCT 56.0 (H) 39.0 - 52.0 %  I-stat troponin, ED  Result Value Ref Range   Troponin i, poc 0.05 0.00 - 0.08 ng/mL   Comment 3           Dg Chest Port 1 View  06/04/2015  CLINICAL DATA:  Shortness of breath and tachycardia. History of pulmonary fibrosis. EXAM: PORTABLE CHEST 1 VIEW COMPARISON:  Multiple prior chest x-rays. FINDINGS: The heart is within normal limits in size given the AP projection of the film. The mediastinal and hilar contours are grossly within normal limits. Changes of chronic pulmonary fibrosis with low lung volumes. No obvious acute overlying infiltrate or edema. No large pleural effusion. No pneumothorax. The bony thorax is intact. IMPRESSION: Chronic changes of pulmonary fibrosis. Low lung volumes with vascular crowding and atelectasis. Electronically Signed   By: Marijo Sanes M.D.   On: 06/04/2015 15:50    3:15 PM he feels improved after treatment with intravenous Cardizem. Heart rate decreased and 95 bpm, atrial flutter. Cardizem intravenous drip ordered  3:50 PM heart rate increased to 150. He received second dose of Cardizem 10 mg IV and intravenous Cardizem drip increased to 10 mg per hour.  4:30 PM patient resting comfortably in Cardizem drip venous drip and hemodynamically stable MDM  Cardiology service consulted and will see patient in the ED Final diagnoses:  None  . Cardiology consult reviewed.Flecanide ordered request of Dr.Croituru if patient converts to sinus rhythm within 4 hours he can be discussed charge back to skilled nursing facility. Cardizem drip should be maintained while awaiting flecainide to cardiovert patient. Once patient  converts to sinus rhythm Cardizem drip can be discontinued Pt signed out to Dr. Jeneen Rinks at 530 pm Diagnosis atrial fibrillation with rapid ventricular response CRITICAL CARE Performed by: Orlie Dakin Total critical care time: 40 minutes Critical care time was exclusive of separately billable procedures and treating other patients. Critical care was necessary to treat or prevent imminent or life-threatening deterioration. Critical care was time spent personally by me on the following activities: development of treatment plan with patient and/or surrogate as well as nursing, discussions with consultants, evaluation of patient's response to treatment, examination of patient, obtaining history from patient or surrogate, ordering and performing treatments and interventions, ordering and review of laboratory studies, ordering and review of radiographic studies, pulse oximetry and re-evaluation of patient's condition.  Orlie Dakin, MD 06/04/15 [place], MD 06/04/15 1731

## 2015-06-05 ENCOUNTER — Inpatient Hospital Stay (HOSPITAL_COMMUNITY): Payer: Medicare Other | Admitting: Anesthesiology

## 2015-06-05 ENCOUNTER — Encounter (HOSPITAL_COMMUNITY)
Admission: EM | Disposition: A | Discharge status: Hospice | Payer: Self-pay | Source: Home / Self Care | Attending: Internal Medicine

## 2015-06-05 ENCOUNTER — Inpatient Hospital Stay (HOSPITAL_COMMUNITY): Payer: Medicare Other

## 2015-06-05 ENCOUNTER — Encounter (HOSPITAL_COMMUNITY): Payer: Medicare Other

## 2015-06-05 ENCOUNTER — Ambulatory Visit: Payer: Medicare Other | Admitting: Internal Medicine

## 2015-06-05 DIAGNOSIS — J9601 Acute respiratory failure with hypoxia: Secondary | ICD-10-CM

## 2015-06-05 DIAGNOSIS — J8410 Pulmonary fibrosis, unspecified: Secondary | ICD-10-CM

## 2015-06-05 HISTORY — PX: CARDIOVERSION: SHX1299

## 2015-06-05 LAB — BASIC METABOLIC PANEL
ANION GAP: 11 (ref 5–15)
BUN: 27 mg/dL — ABNORMAL HIGH (ref 6–20)
CHLORIDE: 97 mmol/L — AB (ref 101–111)
CO2: 26 mmol/L (ref 22–32)
Calcium: 8.1 mg/dL — ABNORMAL LOW (ref 8.9–10.3)
Creatinine, Ser: 0.84 mg/dL (ref 0.61–1.24)
Glucose, Bld: 98 mg/dL (ref 65–99)
POTASSIUM: 4.5 mmol/L (ref 3.5–5.1)
SODIUM: 134 mmol/L — AB (ref 135–145)

## 2015-06-05 LAB — CBC WITH DIFFERENTIAL/PLATELET
BASOS ABS: 0 10*3/uL (ref 0.0–0.1)
BASOS PCT: 0 %
EOS ABS: 0.2 10*3/uL (ref 0.0–0.7)
Eosinophils Relative: 1 %
HEMATOCRIT: 46.4 % (ref 39.0–52.0)
HEMOGLOBIN: 15.3 g/dL (ref 13.0–17.0)
Lymphocytes Relative: 22 %
Lymphs Abs: 4.6 10*3/uL — ABNORMAL HIGH (ref 0.7–4.0)
MCH: 29.1 pg (ref 26.0–34.0)
MCHC: 33 g/dL (ref 30.0–36.0)
MCV: 88.2 fL (ref 78.0–100.0)
MONOS PCT: 6 %
Monocytes Absolute: 1.4 10*3/uL — ABNORMAL HIGH (ref 0.1–1.0)
NEUTROS ABS: 15.3 10*3/uL — AB (ref 1.7–7.7)
NEUTROS PCT: 71 %
Platelets: 253 10*3/uL (ref 150–400)
RBC: 5.26 MIL/uL (ref 4.22–5.81)
RDW: 15.2 % (ref 11.5–15.5)
WBC: 21.5 10*3/uL — AB (ref 4.0–10.5)

## 2015-06-05 LAB — PROTIME-INR
INR: 1.21 (ref 0.00–1.49)
PROTHROMBIN TIME: 15.4 s — AB (ref 11.6–15.2)

## 2015-06-05 LAB — T4, FREE: FREE T4: 0.7 ng/dL (ref 0.61–1.12)

## 2015-06-05 LAB — TSH: TSH: 0.67 u[IU]/mL (ref 0.350–4.500)

## 2015-06-05 SURGERY — CARDIOVERSION
Anesthesia: Monitor Anesthesia Care

## 2015-06-05 MED ORDER — SODIUM CHLORIDE 0.9% FLUSH
3.0000 mL | Freq: Two times a day (BID) | INTRAVENOUS | Status: DC
  Administered 2015-06-05 – 2015-06-09 (×6): 3 mL via INTRAVENOUS

## 2015-06-05 MED ORDER — LIDOCAINE HCL (CARDIAC) 20 MG/ML IV SOLN
INTRAVENOUS | Status: DC | PRN
  Administered 2015-06-05: 60 mg via INTRAVENOUS

## 2015-06-05 MED ORDER — DILTIAZEM HCL 100 MG IV SOLR
5.0000 mg/h | Freq: Once | INTRAVENOUS | Status: AC
  Administered 2015-06-05: 15 mg/h via INTRAVENOUS
  Filled 2015-06-05: qty 100

## 2015-06-05 MED ORDER — FENTANYL CITRATE (PF) 100 MCG/2ML IJ SOLN: 25.0000 ug | INTRAMUSCULAR | Status: DC | PRN

## 2015-06-05 MED ORDER — DILTIAZEM HCL ER COATED BEADS 180 MG PO CP24
180.0000 mg | ORAL_CAPSULE | Freq: Every day | ORAL | Status: DC
  Administered 2015-06-05 – 2015-06-09 (×5): 180 mg via ORAL
  Filled 2015-06-05 (×5): qty 1

## 2015-06-05 MED ORDER — SODIUM CHLORIDE 0.9% FLUSH
3.0000 mL | INTRAVENOUS | Status: DC | PRN
  Administered 2015-06-08: 3 mL via INTRAVENOUS
  Filled 2015-06-05: qty 3

## 2015-06-05 MED ORDER — FUROSEMIDE 10 MG/ML IJ SOLN
40.0000 mg | Freq: Once | INTRAMUSCULAR | Status: AC
  Administered 2015-06-05: 40 mg via INTRAVENOUS
  Filled 2015-06-05: qty 4

## 2015-06-05 MED ORDER — SODIUM CHLORIDE 0.9 % IV SOLN
INTRAVENOUS | Status: DC | PRN
  Administered 2015-06-05: 17:00:00 via INTRAVENOUS

## 2015-06-05 MED ORDER — IPRATROPIUM-ALBUTEROL 0.5-2.5 (3) MG/3ML IN SOLN
3.0000 mL | Freq: Four times a day (QID) | RESPIRATORY_TRACT | Status: DC
  Administered 2015-06-06 – 2015-06-09 (×13): 3 mL via RESPIRATORY_TRACT
  Filled 2015-06-05 (×15): qty 3

## 2015-06-05 MED ORDER — FLECAINIDE ACETATE 50 MG PO TABS
50.0000 mg | ORAL_TABLET | Freq: Two times a day (BID) | ORAL | Status: DC
  Administered 2015-06-05 – 2015-06-09 (×8): 50 mg via ORAL
  Filled 2015-06-05 (×12): qty 1

## 2015-06-05 MED ORDER — PROPOFOL 10 MG/ML IV BOLUS
INTRAVENOUS | Status: AC | PRN
  Administered 2015-06-05: 50 ug via INTRAVENOUS

## 2015-06-05 MED ORDER — SODIUM CHLORIDE 0.9 % IV SOLN: 250.0000 mL | INTRAVENOUS | Status: DC

## 2015-06-05 MED ORDER — LIDOCAINE HCL (CARDIAC) 20 MG/ML IV SOLN
INTRAVENOUS | Status: AC | PRN
  Administered 2015-06-05: 60 mg via INTRAVENOUS

## 2015-06-05 MED ORDER — PROMETHAZINE HCL 25 MG/ML IJ SOLN: 6.2500 mg | INTRAMUSCULAR | Status: DC | PRN

## 2015-06-05 MED ORDER — PROPOFOL 10 MG/ML IV BOLUS
INTRAVENOUS | Status: DC | PRN
  Administered 2015-06-05: 50 mg via INTRAVENOUS

## 2015-06-05 MED ORDER — METHYLPREDNISOLONE SODIUM SUCC 125 MG IJ SOLR
125.0000 mg | Freq: Four times a day (QID) | INTRAMUSCULAR | Status: DC
  Administered 2015-06-05 – 2015-06-09 (×15): 125 mg via INTRAVENOUS
  Filled 2015-06-05 (×15): qty 2

## 2015-06-05 NOTE — Progress Notes (Signed)
Patient Profile: 82 y/o male with a documented PMH of atrial fibrillation and pulmonary fibrosis, presenting to the [facility] ED with complaint of dyspnea and rapid HR, found to be in atrial flutter w/ RVR.   Subjective: No new complaints. Continues to feel weak.   Objective: Vital signs in last 24 hours: Pulse Rate:  [42-156] 139 (03/14 0930) Resp:  [11-46] 23 (03/14 0930) BP: (92-125)/(47-88) 105/76 mmHg (03/14 0930) SpO2:  [81 %-100 %] 81 % (03/14 0930) Weight:  [179 lb (81.194 kg)] 179 lb (81.194 kg) (03/13 1436)    Intake/Output from previous day: 03/13 0701 - 03/14 0700 In: 100 [I.V.:100] Out: -  Intake/Output this shift: Total I/O In: -  Out: 300 [Urine:300]  Medications Current Facility-Administered Medications  Medication Dose Route Frequency Provider Last Rate Last Dose  . apixaban (ELIQUIS) tablet 5 mg  5 mg Oral BID Jay Schlichter, MD   5 mg at 06/05/15 0926  . aspirin EC tablet 81 mg  81 mg Oral Daily Jay Schlichter, MD   81 mg at 06/05/15 0925  . azelastine (ASTELIN) 0.1 % nasal spray 2 spray  2 spray Each Nare BID Jay Schlichter, MD      . citalopram (CELEXA) tablet 20 mg  20 mg Oral Daily Jay Schlichter, MD   20 mg at 06/05/15 0925  . diltiazem (CARDIZEM) 100 mg in dextrose 5 % 100 mL (1 mg/mL) infusion  5-15 mg/hr Intravenous Once Jay Schlichter, MD 15 mL/hr at 06/05/15 0125 15 mg/hr at 06/05/15 0125  . fluticasone (FLONASE) 50 MCG/ACT nasal spray 1 spray  1 spray Each Nare Daily Jay Schlichter, MD      . mometasone-formoterol [facility]) 200-5 MCG/ACT inhaler 2 puff  2 puff Inhalation BID Jay Schlichter, MD      . multivitamin with minerals tablet 1 tablet  1 tablet Oral Daily Jay Schlichter, MD   1 tablet at 06/05/15 0925  . pantoprazole (PROTONIX) EC tablet 40 mg  40 mg Oral Daily Jay Schlichter, MD   40 mg at 06/05/15 0925  . pravastatin (PRAVACHOL) tablet 40 mg  40 mg Oral Daily Jay Schlichter, MD   40 mg at 06/05/15 [id number]   Current Outpatient Prescriptions    Medication Sig Dispense Refill  . apixaban (ELIQUIS) 5 MG TABS tablet Take 5 mg by mouth 2 (two) times daily.    Marland Kitchen aspirin 81 MG tablet Take 81 mg by mouth daily.    Marland Kitchen azelastine (ASTELIN) 0.1 % nasal spray Place 2 sprays into both nostrils 2 (two) times daily. Use in each nostril as directed    . CALCIUM-VITAMIN D PO Take 1 tablet by mouth daily.    . citalopram (CELEXA) 20 MG tablet Take 20 mg by mouth daily.    . fluticasone (FLONASE) 50 MCG/ACT nasal spray Place 1 spray into both nostrils daily.    . Fluticasone-Salmeterol (ADVAIR) 250-50 MCG/DOSE AEPB Inhale 1 puff into the lungs every 12 (twelve) hours.    . hydrocortisone cream 1 % Apply 1 application topically 2 (two) times daily as needed for itching.    . Multiple Vitamin (MULTIVITAMIN WITH MINERALS) TABS tablet Take 1 tablet by mouth daily.    . Multiple Vitamins-Minerals (PRESERVISION AREDS 2) CAPS Take 2 capsules by mouth 2 (two) times daily.    Marland Kitchen omeprazole (PRILOSEC) 20 MG capsule Take 20 mg by mouth daily.    . pravastatin (PRAVACHOL) 40 MG tablet Take 40 mg by mouth daily.    . predniSONE (STERAPRED UNI-PAK  21 TAB) 10 MG (21) TBPK tablet Take 10 mg by mouth daily. 4 tablets for 2 days, then 3 tabs for 2 days, 2 tabs for 2 days, then 1 tab daily    . Testosterone 20.25 MG/ACT (1.62%) GEL Place 5 application onto the skin daily. Dose: 5 pumps on the skin every day      PE: General appearance: alert, cooperative and no distress Neck: no carotid bruit and no JVD Lungs: bilateral diffuse crackels c/w pulmonary fibrosis Heart: irregular rthythm, regular rate Extremities: no LEE Pulses: 2+ and symmetric Skin: warm and dry Neurologic: Grossly normal  Lab Results:   Recent Labs  06/04/15 1500 06/04/15 1521 06/05/15 0449  WBC 24.1*  --  21.5*  HGB 16.3 19.0* 15.3  HCT 48.3 56.0* 46.4  PLT 264  --  253   BMET  Recent Labs  06/04/15 1521 06/05/15 0449  NA 133* 134*  K 5.3* 4.5  CL 93* 97*  CO2  --  26  GLUCOSE  124* 98  BUN 38* 27*  CREATININE 1.10 0.84  CALCIUM  --  8.1*   PT/INR  Recent Labs  06/05/15 0449  LABPROT 15.4*  INR 1.21      Assessment/Plan    Principal Problem:   Atrial flutter with rapid ventricular response (HCC) Active Problems:   Pulmonary fibrosis (HCC)   Interstitial lung disease (HCC)   PAF (paroxysmal atrial fibrillation) (HCC)   Chronic anticoagulation - Eliquis   Atrial flutter (HCC)   1. Atrial Flutter: Rate has improved but patient remains in atrial flutter and is symptomatic w/ increased dyspnea and fatigue. He has been on chronic anticoagulation with Eliquis. He had an unsuccessful attempt with chemical cardioversion in the ED with Flecainide yesterday. We will try to arrange DCCV with the assistance of anesthesiology today. I have spoken with a staff member with anesthesiology regarding this. Scheduling is in process. They will notify us of a time. Patient did eat earlier this am. I have changed diet order to NPO. K is WNL.   2. Pulmonary Fibrosis: on chronic supplemental O2. 4L/min [place]. Followed by Dr. Chase Caller.      LOS: 1 day    Brittainy M. Rosita Fire, PA-C 06/05/2015 10:02 AM  I have seen and examined the patient along with Brittainy M. Rosita Fire, PA-C.  I have reviewed the chart, notes and new data.  I agree with PA's note.  Key new complaints: remains severely dyspneic with minimal movement in bed Key examination changes: atrial flutter, very poor rate control   PLAN: Best option is now DCCV, notwithstanding the risks associated with sedation. Will need Anesthesiology support.  Sanda Klein, MD, [place] [phone] 06/05/2015, 10:49 AM

## 2015-06-05 NOTE — ED Notes (Signed)
Family at bedside. 

## 2015-06-05 NOTE — ED Notes (Signed)
Myself and Santiago Glad, RN readjusted patient on stretcher; patient resting at this time

## 2015-06-05 NOTE — ED Notes (Signed)
Patient physically moved from [place] to [facility]; patient placed on continuous pulse oximetry, blood pressure cuff and oxygen [place] (4L); visitors at bedside

## 2015-06-05 NOTE — ED Notes (Signed)
Assisted in moving pt up in bed-- O2 sats dropped to 83%- 86%. -- O2 on at 4l/m/

## 2015-06-05 NOTE — Significant Event (Signed)
Called by patient's nurse when he was noted to be increasingly tachypneic and hypoxic after returning from DCCV this evening.  Briefly, patient is a 82yo M with h/o a fib on anticoagulation and interstitial pulmonary fibrosis who presented with dyspnea and found to be in rapid atrial flutter. He underwent successful DCCV this evening.  Current vitals: BP 124/73, HR 93, RR 40, satting 80% on 35% Venti mask.  Exam:  General: patient is in acute respiratory distress Neck: JVP difficult to appreciate given accessory muscle use CV: NRRR Pulm: diffuse fine crackles and wheezes throughout, tachypneic with accessory muscle use Ext: no peripheral edema, feet cool to touch   A/P: Hypoxic respiratory failure, likely acutely exacerbated by pulmonary edema in setting of atrial arrhythmia and diastolic dysfunction superimposed on severe chronic respiratory failure.  -- CXR -- ABG -- Lasix 40 mg IV STAT ordered -- BiPAP -- Pulm consulted for further guidance in treatment of respiratory failure  Patient confirmed this evening that he is full code.

## 2015-06-05 NOTE — Progress Notes (Signed)
RT decreased patient from 40% to 35% to keep stas above 80% per MD orders.

## 2015-06-05 NOTE — ED Notes (Signed)
Pt eating breakfast and tolerating well.

## 2015-06-05 NOTE — Procedures (Signed)
Procedure: Electrical Cardioversion Indications:  Atrial Flutter  Procedure Details:  Consent: Risks of procedure as well as the alternatives and risks of each were explained to the (patient/caregiver).  Consent for procedure obtained.  Time Out: Verified patient identification, verified procedure, site/side was marked, verified correct patient position, special equipment/implants available, medications/allergies/relevent history reviewed, required imaging and test results available.  Performed  Patient placed on cardiac monitor, pulse oximetry, supplemental oxygen as necessary.  Sedation given: IV propofol, Dr. Kalman Shan Pacer pads placed anterior and posterior chest.  Cardioverted 1 time(s).  Cardioversion with synchronized biphasic 120J shock.  Evaluation: Findings: Post procedure EKG shows: NSR Complications: None Patient did tolerate procedure well.  Time Spent Directly with the Patient:  30 minutes   Joshua Bullock 06/05/2015, 5:58 PM

## 2015-06-05 NOTE — Consult Note (Addendum)
ELECTROPHYSIOLOGY CONSULT NOTE    Patient ID: Joshua Bullock MRN: [id number], DOB/AGE: 1932/04/23 82 y.o.  Admit date: 06/04/2015 Date of Consult: 06/05/2015  Primary Physician: No primary care provider on file. Primary Cardiologist: [place]  Reason for Consultation: atrial flutter  **Of note, the patient has 2 active charts - all other records are under [id number]  HPI:  Joshua Bullock is a 82 y.o. male with a past medical history significant for IPF, atrial flutter, COPD, hyperlipidemia and OSA.  He was recently admitted to [facility] earlier this month with worsening shortness of breath and hypoxia.  He was discharged to SNF and has had worsening shortness of breath since then. Phone notes with pulmonary recommended MSO4 for symptomatic relief and increased dose of steroids.  He presented to the ER last night with palpitations and progressive shortness of breath. He was found to be in atrial flutter.  He was given Cardizem which improved rates.  Flecainide 300mg  failed to restore SR. DCCV is trying to be arranged with anesthesia by Dr C. EP has been asked to evaluate for treatment options.  Echo 06/2014 demonstrated EF [id number], grade 2 diastolic dysfunction, mild concentric and severe focal basal septal hypertrophy, mild MR, LA 29.  He is currently short of breath at rest, however, his wife states that his breathing is actually improved. He denies chest pain, palpitations, LE edema, recent fevers or chills.   Past Medical History  Diagnosis Date  . Atrial fibrillation ([place])   . Pulmonary fibrosis ([place])   . Pulmonary fibrosis ([place]) 06/04/2015     Surgical History:  Past Surgical History  Procedure Laterality Date  . Tonsillectomy        (Not in a hospital admission)  Inpatient Medications:  . apixaban  5 mg Oral BID  . aspirin EC  81 mg Oral Daily  . azelastine  2 spray Each Nare BID  . citalopram  20 mg Oral Daily  . fluticasone  1 spray Each Nare Daily  .  mometasone-formoterol  2 puff Inhalation BID  . multivitamin with minerals  1 tablet Oral Daily  . pantoprazole  40 mg Oral Daily  . pravastatin  40 mg Oral Daily    Allergies:  Allergies  Allergen Reactions  . Influenza Vaccines Anaphylaxis  . Tetanus Toxoids Anaphylaxis    Social History   Social History  . Marital Status: Married    Spouse Name: N/A  . Number of Children: N/A  . Years of Education: N/A   Occupational History  . Not on file.   Social History Main Topics  . Smoking status: Former Research scientist (life sciences)  . Smokeless tobacco: Not on file  . Alcohol Use: No  . Drug Use: No  . Sexual Activity: Not on file   Other Topics Concern  . Not on file   Social History Narrative  . No narrative on file     Family History  Problem Relation Age of Onset  . Hypertension Father      Review of Systems: All other systems reviewed and are otherwise negative except as noted above.  Physical Exam: Filed Vitals:   06/05/15 1230 06/05/15 1245 06/05/15 1247 06/05/15 1300  BP: 97/69  100/64 106/59  Pulse: 140 69 78 141  Resp: 27 21 26 27   Height:      Weight:      SpO2: 91% 91% 92% 91%    GEN- The patient is elderly, chronically ill appearing, alert and oriented x 3 today, in mild respiratory distress, +  pursed lip breathing   HEENT: normocephalic, atraumatic; sclera clear, conjunctiva pink; hearing intact; oropharynx clear; neck supple  Lungs- +tachypnea, diffuse crackles Heart- tachycardic, irregular rate and rhythm  GI- soft, non-tender, non-distended, bowel sounds present  Extremities- no clubbing, cyanosis, or edema; DP/PT/radial pulses 2+ bilaterally MS- no significant deformity or atrophy Skin- warm and dry, no rash or lesion Psych- euthymic mood, full affect Neuro- strength and sensation are intact  Labs:   Lab Results  Component Value Date   WBC 21.5* 06/05/2015   HGB 15.3 06/05/2015   HCT 46.4 06/05/2015   MCV 88.2 06/05/2015   PLT 253 06/05/2015      Recent Labs Lab 06/05/15 0449  NA 134*  K 4.5  CL 97*  CO2 26  BUN 27*  CREATININE 0.84  CALCIUM 8.1*  GLUCOSE 98      Radiology/Studies: Dg Chest Port 1 View 06/04/2015  CLINICAL DATA:  Shortness of breath and tachycardia. History of pulmonary fibrosis. EXAM: PORTABLE CHEST 1 VIEW COMPARISON:  Multiple prior chest x-rays. FINDINGS: The heart is within normal limits in size given the AP projection of the film. The mediastinal and hilar contours are grossly within normal limits. Changes of chronic pulmonary fibrosis with low lung volumes. No obvious acute overlying infiltrate or edema. No large pleural effusion. No pneumothorax. The bony thorax is intact. IMPRESSION: Chronic changes of pulmonary fibrosis. Low lung volumes with vascular crowding and atelectasis. Electronically Signed   By: Marijo Sanes M.D.   On: 06/04/2015 15:50    EKG: atrial flutter, ventricular rate 155  TELEMETRY: atrial flutter with RVR   Assessment/Plan: 1. Atrial flutter Recurrent atrial flutter in the setting of IPF. He has had worsening pulmonary function over the last several months and is short of breath with any activity prior to development of recurrent atrial flutter. Flecainide failed to convert to SR.  Can not use amio with IPF. DCCV successful this afternoon  Would try Flecainide 50mg  twice daily for rhythm control. Continue CCB Continue Eliquis for CHADS2VASC of 2  2. IPF Continue chronic O2 Consider pulmonary consult with recent admission   Dr Rayann Heman to see later today  His long term prognosis appears to be poor.  Would consider hospice/palliative care consult.   Signed, Chanetta Marshall, NP 06/05/2015 2:05 PM  I have seen, examined the patient, and reviewed the above assessment and plan. On exam, he is terminally ill with pulmonary fibrosis and tachypneic.  Changes to above are made where necessary.  Would use flecainide 50mg  BID and cardizem for afib management.  Though he has focal  hypertrophy, this is a palliative endeavor and probably his best option presently. The patient and his wife are both interested in palliative care.  I will therefore place palliative care consult to assist with goals of care and to help with home options. Hope to discharge soon (his current hypoxia appears to be near baseline)--> we should avoid overreaction to this.  He needs to be DNI.  Electrophysiology team to see as needed while here. Please call with questions.   Co Sign: Thompson Grayer, MD 06/05/2015

## 2015-06-05 NOTE — Anesthesia Preprocedure Evaluation (Addendum)
Anesthesia Evaluation  Patient identified by MRN, date of birth, ID band Patient awake    Reviewed: Allergy & Precautions, NPO status , Patient's Chart, lab work & pertinent test results  Airway Mallampati: II  TM Distance: >3 FB Neck ROM: Full    Dental no notable dental hx. (+) Edentulous Upper, Edentulous Lower, Dental Advisory Given   Pulmonary former smoker,  Pulmonary fibrosis ([place]) 06/04/2015     Pulmonary exam normal breath sounds clear to auscultation       Cardiovascular Normal cardiovascular exam+ dysrhythmias Atrial Fibrillation  Rhythm:Irregular Rate:Normal     Neuro/Psych negative neurological ROS  negative psych ROS   GI/Hepatic negative GI ROS, Neg liver ROS,   Endo/Other  negative endocrine ROS  Renal/GU negative Renal ROS  negative genitourinary   Musculoskeletal negative musculoskeletal ROS (+)   Abdominal   Peds negative pediatric ROS (+)  Hematology negative hematology ROS (+)   Anesthesia Other Findings   Reproductive/Obstetrics negative OB ROS                            Anesthesia Physical Anesthesia Plan  ASA: III  Anesthesia Plan: MAC   Post-op Pain Management:    Induction: Intravenous  Airway Management Planned: Mask  Additional Equipment:   Intra-op Plan:   Post-operative Plan:   Informed Consent: I have reviewed the patients History and Physical, chart, labs and discussed the procedure including the risks, benefits and alternatives for the proposed anesthesia with the patient or authorized representative who has indicated his/her understanding and acceptance.   Dental advisory given  Plan Discussed with: CRNA and Surgeon  Anesthesia Plan Comments:         Anesthesia Quick Evaluation

## 2015-06-05 NOTE — Consult Note (Signed)
Name: Joshua Bullock MRN: [id number] DOB: Dec 31, 1932    ADMISSION DATE:  06/04/2015 CONSULTATION DATE:  06/05/2015  REFERRING MD :  Cardiology  CHIEF COMPLAINT:  Dyspnea  HISTORY OF PRESENT ILLNESS:  82 year old male with PMH as below, which includes Atrial fibrillation (diagnosed 06/2014, on eliquis), Pulmonary fibrosis (on 4L at home 6L exertion. followed by Dr. Lenna Gilford, and Dr. Chase Caller. Hi-res CT is c/w UIP process and collagen vasc screen is essentially neg). He was recently admitted to [facility] 3/4 to 3/7 for dyspena. He was evaluated by Dr. Lake Bells at that time who felt the patient had "Diffuse parenchymal lung disease: UIP on imaging; not clear if this represents a recurrent "flare" of IPF or if this is a steroid responsive ILD that we have been calling IPF. I favor the latter. Will plan for slow taper and will have him f/u with Dr. Chase Caller for second opinion as previously planned. Would favor proceeding with anti-inflammatory treatment and would consider adding Cellcept as steroid sparing agent, but will defer to [facility]. We are holding Esbriet for now due to side effects and questionable diagnosis of IPF. Needs CCP antibody sent but no sense in doing that while on steroids as he could have a false negative later misinterpreted." His course improved with steroids and diuresis and he was discharged to SNF. He reports feeling very week since time of discharge.  3/13 he presented to [facility] ED with complaints of worsening dyspnea. He was again noted to be in AF RVR. He was initially rate controlled on diltiazem and he was started on flecainide, however, this did not restore sinus rhythm. He underwent DCCV 3/14 at [facility]. Later that evening he had worsening SOB and hypoxemia requiring BiPAP. PCCM called for further evaluation.  SIGNIFICANT EVENTS  3/14 admit  STUDIES:   2DEcho 06/2014 showed norm LV size & function w/ EF=60-65%, no regional wall motion abn, severe focal basal septal  hypertrophyGr 2 DD, mildly thickened AoV & MV leaflets w/ mild MR; RV- poorly vis...  Full PFTs done 12/14/14 showed FVC=2.28 (54%), FEV1=1.75 (58%), %1sec=77, mid-flows sl reduced at 68% predicted; after the bronchodil the FEV1 was unchanged; TLC=4.48 (61%), RV=2.18 (79%), RV/TLC=49; DLCO=47% predicted; these PFTs show a moderate restricted ventilatory defect w/ superimposed small airways disease; Diffusion is significantly reduced...   Additional LABS & collagen-vasc screen 10/16 > Sed=17, CRP=0.6 (norm<20), ACE=29, RheumFactor<10, ANA=neg, ANCA=neg (MPO & PR-3)...  Hi-resolution CT Chest> Done 01/16/15: Norm heart size, atherosclerotic changes, mildly enlarged mediastinal LNs (1.2-1.5cm size), no pulm nodules or emphysema, +ILD w/ patchy subpleural reticulation bilat w/ assoc traction bronchiectassis & scatered regions of honeycobing (anterior upper lobes and posterior lower lobes) w/ air trapping on expiration, min areas of GG opac noted; this is felt to be c/w UIP pathology; note: granulomatous calcif in liver & spleen, +gallstone, +DJD Tspine...    PAST MEDICAL HISTORY :   has a past medical history of Atrial fibrillation ([place]); Pulmonary fibrosis ([place]); and Pulmonary fibrosis ([place]) (06/04/2015).  has past surgical history that includes Tonsillectomy. Prior to Admission medications   Medication Sig Start Date End Date Taking? Authorizing Provider  apixaban (ELIQUIS) 5 MG TABS tablet Take 5 mg by mouth 2 (two) times daily.   Yes Historical Provider, MD  aspirin 81 MG tablet Take 81 mg by mouth daily.   Yes Historical Provider, MD  azelastine (ASTELIN) 0.1 % nasal spray Place 2 sprays into both nostrils 2 (two) times daily. Use in each nostril as directed   Yes  Historical Provider, MD  CALCIUM-VITAMIN D PO Take 1 tablet by mouth daily.   Yes Historical Provider, MD  citalopram (CELEXA) 20 MG tablet Take 20 mg by mouth daily.   Yes Historical Provider, MD  fluticasone (FLONASE) 50  MCG/ACT nasal spray Place 1 spray into both nostrils daily.   Yes Historical Provider, MD  Fluticasone-Salmeterol (ADVAIR) 250-50 MCG/DOSE AEPB Inhale 1 puff into the lungs every 12 (twelve) hours.   Yes Historical Provider, MD  hydrocortisone cream 1 % Apply 1 application topically 2 (two) times daily as needed for itching.   Yes Historical Provider, MD  Multiple Vitamin (MULTIVITAMIN WITH MINERALS) TABS tablet Take 1 tablet by mouth daily.   Yes Historical Provider, MD  Multiple Vitamins-Minerals (PRESERVISION AREDS 2) CAPS Take 2 capsules by mouth 2 (two) times daily.   Yes Historical Provider, MD  omeprazole (PRILOSEC) 20 MG capsule Take 20 mg by mouth daily.   Yes Historical Provider, MD  pravastatin (PRAVACHOL) 40 MG tablet Take 40 mg by mouth daily.   Yes Historical Provider, MD  predniSONE (STERAPRED UNI-PAK 21 TAB) 10 MG (21) TBPK tablet Take 10 mg by mouth daily. 4 tablets for 2 days, then 3 tabs for 2 days, 2 tabs for 2 days, then 1 tab daily   Yes Historical Provider, MD  Testosterone 20.25 MG/ACT (1.62%) GEL Place 5 application onto the skin daily. Dose: 5 pumps on the skin every day   Yes Historical Provider, MD   Allergies  Allergen Reactions  . Influenza Vaccines Anaphylaxis  . Tetanus Toxoids Anaphylaxis    FAMILY HISTORY:  family history includes Hypertension in his father. SOCIAL HISTORY:  reports that he has quit smoking. He does not have any smokeless tobacco history on file. He reports that he does not drink alcohol or use illicit drugs.  REVIEW OF SYSTEMS:   Review of Systems:   Bolds are positive  Constitutional: weight loss, gain, night sweats, Fevers, chills, fatigue .  HEENT: headaches, Sore throat, sneezing, nasal congestion, post nasal drip, Difficulty swallowing, Tooth/dental problems, visual complaints visual changes, ear ache CV:  chest pain, radiates: ,Orthopnea, PND, swelling in lower extremities, dizziness, palpitations, syncope.  GI  heartburn,  indigestion, abdominal pain, nausea, vomiting, diarrhea, change in bowel habits, loss of appetite, bloody stools.  Resp: cough, productive: , hemoptysis, dyspnea, chest pain, pleuritic.  Skin: rash or itching or icterus GU: dysuria, change in color of urine, urgency or frequency. flank pain, hematuria  MS: joint pain or swelling. decreased range of motion  Psych: change in mood or affect. depression or anxiety.  Neuro: difficulty with speech, weakness, numbness, ataxia   SUBJECTIVE:   VITAL SIGNS: Temp:  [97.5 F (36.4 C)-98.4 F (36.9 C)] 98.4 F (36.9 C) (03/14 2030) Pulse Rate:  [42-150] 90 (03/14 2127) Resp:  [11-43] 32 (03/14 2127) BP: (94-122)/(58-93) 122/79 mmHg (03/14 1844) SpO2:  [78 %-100 %] 88 % (03/14 2127) FiO2 (%):  [30 %-40 %] 35 % (03/14 2044) Weight:  [73.211 kg (161 lb 6.4 oz)] 73.211 kg (161 lb 6.4 oz) (03/14 1839)  PHYSICAL EXAMINATION:  General:  Chronically ill appearing male in respiratory distress on BiPAP Neuro:  Alert, oriented, non-focal HEENT:  [place]/AT, PERRL, no JVD noted Cardiovascular: S1S2, no MRG Lungs:  Coarse, obvious distress on BiPAP Abdomen:  Soft, non-tender, non-distended Musculoskeletal:  No acute deformity or ROM limiation Skin:  Grossly intact   Recent Labs Lab 06/04/15 1521 06/05/15 0449  NA 133* 134*  K 5.3* 4.5  CL 93*  97*  CO2  --  26  BUN 38* 27*  CREATININE 1.10 0.84  GLUCOSE 124* 98    Recent Labs Lab 06/04/15 1500 06/04/15 1521 06/05/15 0449  HGB 16.3 19.0* 15.3  HCT 48.3 56.0* 46.4  WBC 24.1*  --  21.5*  PLT 264  --  253   Dg Chest Port 1 View  06/05/2015  CLINICAL DATA:  Acute onset of respiratory distress. Initial encounter. EXAM: PORTABLE CHEST 1 VIEW COMPARISON:  Chest radiograph from 06/04/2015 FINDINGS: The lungs are hypoexpanded. Diffuse bilateral airspace opacification reflects the patient's known pulmonary fibrosis. Superimposed infection cannot be excluded. No definite pleural effusion or pneumothorax  is identified. The cardiomediastinal silhouette is borderline normal in size. No acute osseous abnormalities are seen. IMPRESSION: Lungs hypoexpanded. Diffuse bilateral airspace opacification reflects patient's known pulmonary fibrosis. Superimposed infection cannot be excluded. Electronically Signed   By: Garald Balding M.D.   On: 06/05/2015 21:19   Dg Chest Port 1 View  06/04/2015  CLINICAL DATA:  Shortness of breath and tachycardia. History of pulmonary fibrosis. EXAM: PORTABLE CHEST 1 VIEW COMPARISON:  Multiple prior chest x-rays. FINDINGS: The heart is within normal limits in size given the AP projection of the film. The mediastinal and hilar contours are grossly within normal limits. Changes of chronic pulmonary fibrosis with low lung volumes. No obvious acute overlying infiltrate or edema. No large pleural effusion. No pneumothorax. The bony thorax is intact. IMPRESSION: Chronic changes of pulmonary fibrosis. Low lung volumes with vascular crowding and atelectasis. Electronically Signed   By: Marijo Sanes M.D.   On: 06/04/2015 15:50    ASSESSMENT / PLAN:  Acute on chronic hypoxemic respiratory failure - Likely in setting of IPF flare, but need to rule out pulmonary edema and infection. Patient has elevated WBC. This could be attributed to steroids, he has been afebrile. Cardiology is actively [place].  Pulmonary fibrosis   Plan: -Continuous BiPAP, settings reviewed and adjusted -Wean FiO2 to keep SpO2 greater than 88% -Patient adamant that he would not want intubation/CPR if this worsens -Will add steroids 125mg  Solumedrol q 6 hours for now -D/c home advair and replace with Scheduled duoneb -If work of breathing continues to worsen then will add PRN morphine -Suspect prognosis is very poor  Atrial flutter with rapid ventricular response s/p DCCV 3/14 -Per cardiology   Georgann Housekeeper, AGACNP-BC [place] [facility]/Critical [facility] Pager [phone] or [phone]  06/05/2015 10:50  PM

## 2015-06-05 NOTE — Transfer of Care (Signed)
Immediate Anesthesia Transfer of Care Note  Patient: Joshua Bullock  Procedure(s) Performed: Procedure(s): CARDIOVERSION (N/A)  Patient Location: Emergency Department   Anesthesia Type:MAC  Level of Consciousness: awake, alert  and oriented  Airway & Oxygen Therapy: Patient Spontanous Breathing and Patient connected to face mask oxygen  Post-op Assessment: Report given to RN and Post -op Vital signs reviewed and stable  Post vital signs: Reviewed and stable  Last Vitals:  Filed Vitals:   06/05/15 1745 06/05/15 1749  BP: 99/67   Pulse: 132 72  Resp: 33 24    Complications: No apparent anesthesia complications

## 2015-06-05 NOTE — ED Notes (Signed)
Meal tray ordered 

## 2015-06-05 NOTE — ED Notes (Signed)
Patient given a pitcher of ice water with a cup, lid and straw; patient already drinking some water; visitors at bedside

## 2015-06-06 ENCOUNTER — Encounter (HOSPITAL_COMMUNITY): Payer: Self-pay | Admitting: Internal Medicine

## 2015-06-06 DIAGNOSIS — Z515 Encounter for palliative care: Secondary | ICD-10-CM

## 2015-06-06 DIAGNOSIS — R0902 Hypoxemia: Secondary | ICD-10-CM

## 2015-06-06 DIAGNOSIS — J9601 Acute respiratory failure with hypoxia: Secondary | ICD-10-CM

## 2015-06-06 DIAGNOSIS — I4892 Unspecified atrial flutter: Principal | ICD-10-CM

## 2015-06-06 DIAGNOSIS — Z7189 Other specified counseling: Secondary | ICD-10-CM | POA: Insufficient documentation

## 2015-06-06 DIAGNOSIS — R0600 Dyspnea, unspecified: Secondary | ICD-10-CM | POA: Insufficient documentation

## 2015-06-06 DIAGNOSIS — F411 Generalized anxiety disorder: Secondary | ICD-10-CM | POA: Insufficient documentation

## 2015-06-06 DIAGNOSIS — J9621 Acute and chronic respiratory failure with hypoxia: Secondary | ICD-10-CM | POA: Insufficient documentation

## 2015-06-06 DIAGNOSIS — J849 Interstitial pulmonary disease, unspecified: Secondary | ICD-10-CM

## 2015-06-06 LAB — CBC WITH DIFFERENTIAL/PLATELET
BASOS PCT: 0 %
Basophils Absolute: 0 10*3/uL (ref 0.0–0.1)
EOS ABS: 0 10*3/uL (ref 0.0–0.7)
Eosinophils Relative: 0 %
HCT: 51.9 % (ref 39.0–52.0)
Hemoglobin: 17.2 g/dL — ABNORMAL HIGH (ref 13.0–17.0)
Lymphocytes Relative: 6 %
Lymphs Abs: 1.5 10*3/uL (ref 0.7–4.0)
MCH: 29.2 pg (ref 26.0–34.0)
MCHC: 33.1 g/dL (ref 30.0–36.0)
MCV: 88.1 fL (ref 78.0–100.0)
MONO ABS: 0.5 10*3/uL (ref 0.1–1.0)
MONOS PCT: 2 %
NEUTROS ABS: 25.1 10*3/uL — AB (ref 1.7–7.7)
NEUTROS PCT: 93 %
Platelets: 267 10*3/uL (ref 150–400)
RBC: 5.89 MIL/uL — ABNORMAL HIGH (ref 4.22–5.81)
RDW: 15.1 % (ref 11.5–15.5)
WBC: 27.1 10*3/uL — ABNORMAL HIGH (ref 4.0–10.5)

## 2015-06-06 LAB — BASIC METABOLIC PANEL
ANION GAP: 13 (ref 5–15)
BUN: 25 mg/dL — ABNORMAL HIGH (ref 6–20)
CALCIUM: 8.6 mg/dL — AB (ref 8.9–10.3)
CO2: 30 mmol/L (ref 22–32)
Chloride: 92 mmol/L — ABNORMAL LOW (ref 101–111)
Creatinine, Ser: 0.99 mg/dL (ref 0.61–1.24)
Glucose, Bld: 118 mg/dL — ABNORMAL HIGH (ref 65–99)
Potassium: 4.2 mmol/L (ref 3.5–5.1)
Sodium: 135 mmol/L (ref 135–145)

## 2015-06-06 LAB — BLOOD GAS, ARTERIAL
Acid-Base Excess: 6.5 mmol/L — ABNORMAL HIGH (ref 0.0–2.0)
BICARBONATE: 30 meq/L — AB (ref 20.0–24.0)
Drawn by: 244861
FIO2: 0.35
O2 Saturation: 77.3 %
PH ART: 7.502 — AB (ref 7.350–7.450)
PO2 ART: 41.9 mmHg — AB (ref 80.0–100.0)
Patient temperature: 98.4
TCO2: 31.2 mmol/L (ref 0–100)
pCO2 arterial: 38.5 mmHg (ref 35.0–45.0)

## 2015-06-06 LAB — PROTIME-INR
INR: 1.46 (ref 0.00–1.49)
PROTHROMBIN TIME: 17.9 s — AB (ref 11.6–15.2)

## 2015-06-06 MED ORDER — MORPHINE SULFATE (PF) 2 MG/ML IV SOLN: 1.0000 mg | INTRAVENOUS | Status: DC | PRN

## 2015-06-06 NOTE — Progress Notes (Signed)
Name: Joshua Bullock MRN: [id number] DOB: 14-Jun-1932    ADMISSION DATE:  06/04/2015 CONSULTATION DATE:  06/05/2015  REFERRING MD :  Cardiology  CHIEF COMPLAINT:  Dyspnea  HISTORY OF PRESENT ILLNESS:  82 year old male with PMH as below, which includes Atrial fibrillation (diagnosed 06/2014, on eliquis), Pulmonary fibrosis (on 4L at home 6L exertion. followed by Dr. Lenna Gilford, and Dr. Chase Caller. Hi-res CT is c/w UIP process and collagen vasc screen is essentially neg). He was recently admitted to [facility] 3/4 to 3/7 for dyspena. He was evaluated by Dr. Lake Bells at that time who felt the patient had "Diffuse parenchymal lung disease: UIP on imaging; not clear if this represents a recurrent "flare" of IPF or if this is a steroid responsive ILD that we have been calling IPF. I favor the latter. Will plan for slow taper and will have him f/u with Dr. Chase Caller for second opinion as previously planned. Would favor proceeding with anti-inflammatory treatment and would consider adding Cellcept as steroid sparing agent, but will defer to [facility]. We are holding Esbriet for now due to side effects and questionable diagnosis of IPF. Needs CCP antibody sent but no sense in doing that while on steroids as he could have a false negative later misinterpreted." His course improved with steroids and diuresis and he was discharged to SNF. He reports feeling very week since time of discharge. 3/13 he presented to [facility] ED with complaints of worsening dyspnea. He was again noted to be in AF RVR. He was initially rate controlled on diltiazem and he was started on flecainide, however, this did not restore sinus rhythm. He underwent DCCV 3/14 at [facility]. Later that evening he had worsening SOB and hypoxemia requiring BiPAP. PCCM called for further evaluation.  SIGNIFICANT EVENTS  3/14 admit  STUDIES:   2DEcho 06/2014 showed norm LV size & function w/ EF=60-65%, no regional wall motion abn, severe focal basal septal  hypertrophyGr 2 DD, mildly thickened AoV & MV leaflets w/ mild MR; RV- poorly vis...  Full PFTs done 12/14/14 showed FVC=2.28 (54%), FEV1=1.75 (58%), %1sec=77, mid-flows sl reduced at 68% predicted; after the bronchodil the FEV1 was unchanged; TLC=4.48 (61%), RV=2.18 (79%), RV/TLC=49; DLCO=47% predicted; these PFTs show a moderate restricted ventilatory defect w/ superimposed small airways disease; Diffusion is significantly reduced...   Additional LABS & collagen-vasc screen 10/16 > Sed=17, CRP=0.6 (norm<20), ACE=29, RheumFactor<10, ANA=neg, ANCA=neg (MPO & PR-3)...  Hi-resolution CT Chest> Done 01/16/15: Norm heart size, atherosclerotic changes, mildly enlarged mediastinal LNs (1.2-1.5cm size), no pulm nodules or emphysema, +ILD w/ patchy subpleural reticulation bilat w/ assoc traction bronchiectassis & scatered regions of honeycobing (anterior upper lobes and posterior lower lobes) w/ air trapping on expiration, min areas of GG opac noted; this is felt to be c/w UIP pathology; note: granulomatous calcif in liver & spleen, +gallstone, +DJD Tspine...   SUBJECTIVE:   VITAL SIGNS: Temp:  [97.5 F (36.4 C)-98.7 F (37.1 C)] 98.2 F (36.8 C) (03/15 0735) Pulse Rate:  [51-150] 89 (03/15 0328) Resp:  [18-50] 26 (03/15 0328) BP: (94-129)/(59-93) 129/90 mmHg (03/15 0037) SpO2:  [75 %-100 %] 99 % (03/15 0759) FiO2 (%):  [30 %-80 %] 55 % (03/15 0759) Weight:  [156 lb 12.8 oz (71.124 kg)-161 lb 6.4 oz (73.211 kg)] 156 lb 12.8 oz (71.124 kg) (03/15 0432)  PHYSICAL EXAMINATION:  General:  Chronically ill appearing male now more comfortable on mask 55% Neuro:  Alert, oriented, non-focal HEENT:  [place]/AT, PERRL, no JVD noted Cardiovascular: S1S2, no MRG Lungs: posterior  rales, mild accessory use  Abdomen:  Soft, non-tender, non-distended Musculoskeletal:  No acute deformity or ROM limiation Skin:  Grossly intact   Recent Labs Lab 06/04/15 1521 06/05/15 0449 06/06/15 0420  NA 133* 134*  135  K 5.3* 4.5 4.2  CL 93* 97* 92*  CO2  --  26 30  BUN 38* 27* 25*  CREATININE 1.10 0.84 0.99  GLUCOSE 124* 98 118*    Recent Labs Lab 06/04/15 1500 06/04/15 1521 06/05/15 0449 06/06/15 0420  HGB 16.3 19.0* 15.3 17.2*  HCT 48.3 56.0* 46.4 51.9  WBC 24.1*  --  21.5* 27.1*  PLT 264  --  253 267   Dg Chest Port 1 View  06/05/2015  CLINICAL DATA:  Acute onset of respiratory distress. Initial encounter. EXAM: PORTABLE CHEST 1 VIEW COMPARISON:  Chest radiograph from 06/04/2015 FINDINGS: The lungs are hypoexpanded. Diffuse bilateral airspace opacification reflects the patient's known pulmonary fibrosis. Superimposed infection cannot be excluded. No definite pleural effusion or pneumothorax is identified. The cardiomediastinal silhouette is borderline normal in size. No acute osseous abnormalities are seen. IMPRESSION: Lungs hypoexpanded. Diffuse bilateral airspace opacification reflects patient's known pulmonary fibrosis. Superimposed infection cannot be excluded. Electronically Signed   By: Garald Balding M.D.   On: 06/05/2015 21:19   Dg Chest Port 1 View  06/04/2015  CLINICAL DATA:  Shortness of breath and tachycardia. History of pulmonary fibrosis. EXAM: PORTABLE CHEST 1 VIEW COMPARISON:  Multiple prior chest x-rays. FINDINGS: The heart is within normal limits in size given the AP projection of the film. The mediastinal and hilar contours are grossly within normal limits. Changes of chronic pulmonary fibrosis with low lung volumes. No obvious acute overlying infiltrate or edema. No large pleural effusion. No pneumothorax. The bony thorax is intact. IMPRESSION: Chronic changes of pulmonary fibrosis. Low lung volumes with vascular crowding and atelectasis. Electronically Signed   By: Marijo Sanes M.D.   On: 06/04/2015 15:50    ASSESSMENT / PLAN:  Acute on chronic hypoxemic respiratory failure - Likely in setting of IPF flare (after recent reduction of steroids), but need to rule out  pulmonary edema and infection. Patient has elevated WBC. This could be attributed to steroids, he has been afebrile. Cardiology is actively [place].  Pulmonary fibrosis   Plan: -change to high-flow heated O2 -Wean FiO2 to keep SpO2 greater than 88% -Patient adamant that he would not want intubation/CPR if this worsens -Cont steroids 125mg  Solumedrol q 6 hours for now -D/c home advair and replace with Scheduled duoneb -If work of breathing continues to worsen then will add PRN morphine -Suspect prognosis is very poor  Atrial flutter with rapid ventricular response s/p DCCV 3/14 -Per cardiology  Erick Colace ACNP-BC [place] Pager # [phone] OR # [phone] if no answer  06/06/2015 9:43 AM  Attending Note:  82 year old male with steroid responsive IPF who presents to the hospital with a-fib with RVR who underwent a cardioversion with stabilization of rhythm.  Patient was tapering his steroid dose then developed a flare of IPF resulting a-fib with RVR.  On exam, he is lethargic but easily arousable with diffuse crackles diffusely.  I reviewed CXR myself, opacities noted diffusely.  Discussed with PCCM-NP.  Respiratory failure: Due to IPF.  - Steroids as ordered.  - DNR.  IPF: steroid responsive.  - Steroids to IVF.  - Hold off taper for now.  - Fluticasone.  - Duonebs.  A-fib with RVR: caused by IPF likely.  - Cardioversion.  -  Eliquis.  - Dilt for rate control.  Hypoxemia: due to IPF.  - Titrate O2 for sat of 88-92%.  - Continue high flow O2.  Patient seen and examined, agree with above note.  I dictated the care and orders written for this patient under my direction.  Rush Farmer, MD [phone]

## 2015-06-06 NOTE — Progress Notes (Signed)
Note: The patient has 2 medical record charts which are marked for merger. The majority of his past medical history/cardiac records is on file under the medical record number [id number]  Patient Name: Joshua Bullock Date of Encounter: 06/06/2015  Hospital Problem List     Principal Problem:   Atrial flutter with rapid ventricular response (HCC) Active Problems:   Pulmonary fibrosis (HCC)   Interstitial lung disease ([place])   PAF (paroxysmal atrial fibrillation) (HCC)   Chronic anticoagulation - Eliquis   Acute respiratory failure with hypoxia ([place])    Subjective   DCCV 3/14 under anesthesia with one synchronized biphasic 120j shock with conversion to NSR, rate 60's around 1730.  Approximately 3 hours later patient developed acute hypoxic respiratory distress requiring BiPap, lasix 40mg  IV once. CCM consulted.  As of 0800, he is off Bipap on venti mask.  He denies any chest pain, palpations, or SOB; complains of not sleeping well and feeling extremely tired.  Maintaining sinus.  Inpatient Medications    . apixaban  5 mg Oral BID  . azelastine  2 spray Each Nare BID  . citalopram  20 mg Oral Daily  . diltiazem  180 mg Oral Daily  . flecainide  50 mg Oral Q12H  . fluticasone  1 spray Each Nare Daily  . ipratropium-albuterol  3 mL Nebulization Q6H  . methylPREDNISolone (SOLU-MEDROL) injection  125 mg Intravenous Q6H  . multivitamin with minerals  1 tablet Oral Daily  . pantoprazole  40 mg Oral Daily  . pravastatin  40 mg Oral Daily  . sodium chloride flush  3 mL Intravenous Q12H    Vital Signs    Filed Vitals:   06/06/15 0432 06/06/15 0735 06/06/15 0754 06/06/15 0759  BP:      Pulse:      Temp: 98.3 F (36.8 C) 98.2 F (36.8 C)    TempSrc: Axillary Axillary    Resp:      Height:      Weight: 156 lb 12.8 oz (71.124 kg)     SpO2:   100% 99%    Intake/Output Summary (Last 24 hours) at 06/06/15 0828 Last data filed at 06/05/15 2358  Gross per 24 hour  Intake     413 ml  Output   1700 ml  Net  -1287 ml   Filed Weights   06/04/15 1436 06/05/15 1839 06/06/15 0432  Weight: 179 lb (81.194 kg) 161 lb 6.4 oz (73.211 kg) 156 lb 12.8 oz (71.124 kg)    Physical Exam    General: Chronically ill and weak appearing elderly male Neuro: Alert and oriented, sleepy. Moves all extremities spontaneously.  Generalized weakness.  Psych: Normal affect. HEENT:  Normal  Neck: Supple without bruits or JVD. Lungs:  Diffuse crackles, mild labored breathing with some accessory muscle use on venti mask Heart: RRR no s3, s4, or murmurs. No LEE.  Skin warm and dry, 2+ radial and pedal pulses Abdomen: Soft, non-tender, non-distended, BS + x 4.  Extremities: No clubbing, cyanosis or edema. DP/PT/Radials 2+ and equal bilaterally.  Labs    CBC  Recent Labs  06/05/15 0449 06/06/15 0420  WBC 21.5* 27.1*  NEUTROABS 15.3* 25.1*  HGB 15.3 17.2*  HCT 46.4 51.9  MCV 88.2 88.1  PLT 253 [id number]   Basic Metabolic Panel  Recent Labs  06/05/15 0449 06/06/15 0420  NA 134* 135  K 4.5 4.2  CL 97* 92*  CO2 26 30  GLUCOSE 98 118*  BUN 27* 25*  CREATININE 0.84 0.99  CALCIUM 8.1* 8.6*   Thyroid Function Tests  Recent Labs  06/05/15 0450  TSH 0.670    Telemetry    NSR, rate 80's.  4 episodes of short self resolving runs of svt/a tach since DCCV yesterday @ 1947, 2221, 0120, 0229  ECG    3/14- SR 63, RAD, biatrial enlargement, prob rvh.  Radiology    Dg Chest Port 1 View  06/05/2015  CLINICAL DATA:  Acute onset of respiratory distress. Initial encounter. EXAM: PORTABLE CHEST 1 VIEW COMPARISON:  Chest radiograph from 06/04/2015 FINDINGS: The lungs are hypoexpanded. Diffuse bilateral airspace opacification reflects the patient's known pulmonary fibrosis. Superimposed infection cannot be excluded. No definite pleural effusion or pneumothorax is identified. The cardiomediastinal silhouette is borderline normal in size. No acute osseous abnormalities are seen.  IMPRESSION: Lungs hypoexpanded. Diffuse bilateral airspace opacification reflects patient's known pulmonary fibrosis. Superimposed infection cannot be excluded. Electronically Signed   By: Garald Balding M.D.   On: 06/05/2015 21:19   Dg Chest Port 1 View  06/04/2015  CLINICAL DATA:  Shortness of breath and tachycardia. History of pulmonary fibrosis. EXAM: PORTABLE CHEST 1 VIEW COMPARISON:  Multiple prior chest x-rays. FINDINGS: The heart is within normal limits in size given the AP projection of the film. The mediastinal and hilar contours are grossly within normal limits. Changes of chronic pulmonary fibrosis with low lung volumes. No obvious acute overlying infiltrate or edema. No large pleural effusion. No pneumothorax. The bony thorax is intact. IMPRESSION: Chronic changes of pulmonary fibrosis. Low lung volumes with vascular crowding and atelectasis. Electronically Signed   By: Marijo Sanes M.D.   On: 06/04/2015 15:50    Assessment & Plan    Principal Problem:  Atrial flutter with rapid ventricular response (HCC) Active Problems:  Pulmonary fibrosis (HCC)  Interstitial lung disease (HCC)  PAF (paroxysmal atrial fibrillation) (HCC)  Chronic anticoagulation - Eliquis  Atrial flutter ([place])  1.  Atrial Flutter:  Successful DCCV under anesthesia on 3/14 for new onset of Aflutter with RVR on 3/13 after failed chemical conversion w/ flecainide 300mg .  Seen by EP with recommendation to continue flecainide going forward (50 BID).  Cont CCB.  High risk for recurrent AF in setting of pulmonary fibrosis.  CHA2DS2VASc =  2.  Cont eliquis.  2. Advanced Pulmonary Fibrosis with acute on chronic respiratory failure:  Following DCCV 3/14, he developed acute hypoxic respiratory distress requiring Bipap.  ABG confirmed hypoxia and metabolic alkalosis.  Pulmonary consulted.  Acute sudden decompensation not clear, less likely acute PE after DCCV since he is chronically anticoagulated.  HCAP/pneumonia not  felt likely.  Pulmonary suggest treating as a flare of IPF with solumedrol 125mg  q 6hr and keep Spo2 > 88%.  Patient continues to request no further life supporting measures, DNR issued. Palliative care consult ordered in the setting of poor prognosis of advanced IPF.     Signed, Murray Hodgkins, NP 06/06/2015, 9:46 AM  I have seen and examined the patient along with Murray Hodgkins, NP.  I have reviewed the chart, notes and new data.  I agree with PA/NP's note.  Key new complaints: doing much better right now, sleeping peacefully after eating a big lunch, without desaturatin Key examination changes: 97% sat on high flow O2 (45%, 15 L), in NSR  PLAN: Unlikely he will be able to return to [facility] unless O2 requirements improve quickly. Will start to look for alternative facilities. His wife is unable to provide the needed level of care at  home.  Sanda Klein, MD, [place] [phone] 06/06/2015, 3:07 PM

## 2015-06-06 NOTE — Anesthesia Postprocedure Evaluation (Signed)
Anesthesia Post Note  Patient: Joshua Bullock  Procedure(s) Performed: Procedure(s) (LRB): CARDIOVERSION (N/A)  Patient location during evaluation: PACU Anesthesia Type: MAC Level of consciousness: awake and alert Pain management: pain level controlled Vital Signs Assessment: post-procedure vital signs reviewed and stable Respiratory status: spontaneous breathing, nonlabored ventilation, respiratory function stable and patient connected to nasal cannula oxygen Cardiovascular status: blood pressure returned to baseline and stable Postop Assessment: no signs of nausea or vomiting Anesthetic complications: no    Last Vitals:  Filed Vitals:   06/06/15 0432 06/06/15 0735  BP:    Pulse:    Temp: 36.8 C 36.8 C  Resp:      Last Pain:  Filed Vitals:   06/06/15 0800  PainSc: 0-No pain                 Rome Schlauch S

## 2015-06-06 NOTE — Clinical Social Work Note (Signed)
Clinical Social Work Assessment  Patient Details  Name: Emily Forse MRN: [id number] Date of Birth: 01/31/1933  Date of referral:  06/06/15               Reason for consult:  Facility Placement, Discharge Planning                Permission sought to share information with:  Facility Sport and exercise psychologist, Family Supports Permission granted to share information::  Yes, Verbal Permission Granted  Name::     Wife Ship broker::  [employer]  Relationship::     Contact Information:     Housing/Transportation Living arrangements for the past 2 months:  [place], [place] of Information:  Patient, Spouse Patient Interpreter Needed:  None Criminal Activity/Legal Involvement Pertinent to Current Situation/Hospitalization:  No - Comment as needed Significant Relationships:  Spouse Lives with:  Self Do you feel safe going back to the place where you live?  Yes Need for family participation in patient care:  Yes (Comment)  Care giving concerns:  Patient and wife agree with recommendation for post acute placement at SNF.   Social Worker assessment / plan:  CSW met with patient and patient's wife at bedside to complete assessment. The patient was admitted from [facility] where he was receiving short term rehab. The patient and family would prefer returning to [facility] at discharge but understand that this cannot be guaranteed as the patient and wife did not hold the patient's bed and the patient's current care needs exceed the SNF's ability. Wife and patient are agreeable to looking at other possible SNF options in the event [facility] cannot accept the patient back. CSW will follow.  Employment status:  Disabled (Comment on whether or not currently receiving Disability), Retired Forensic scientist:  Medicare PT Recommendations:  [place] / Referral to community resources:  [place]  Patient/Family's  Response to care:  The patient and family appear happy with the care the patient has received.   Patient/Family's Understanding of and Emotional Response to Diagnosis, Current Treatment, and Prognosis:  Patient and wife remain hopefull that the patient's respiratory status will improve. Both appear to have a good understanding of the patient's condition.  Emotional Assessment Appearance:  Appears stated age Attitude/Demeanor/Rapport:  Other (Patient is appropriate and welcoming of CSW.) Affect (typically observed):  Accepting, Appropriate, Calm, Pleasant Orientation:  Oriented to Self, Oriented to Place, Oriented to  Time, Oriented to Situation Alcohol / Substance use:  Not Applicable Psych involvement (Current and /or in the community):  No (Comment)  Discharge Needs  Concerns to be addressed:  Discharge Planning Concerns Readmission within the last 30 days:  No Current discharge risk:  Chronically ill, Physical Impairment Barriers to Discharge:  Continued Medical Work up   Rigoberto Noel, LCSW 06/06/2015, 3:21 PM

## 2015-06-06 NOTE — Plan of Care (Signed)
Problem: Phase I Progression Outcomes Goal: O2 sats > or equal 90% or at baseline Outcome: Progressing Pt now on high flow nasal canula

## 2015-06-06 NOTE — Progress Notes (Addendum)
The client appeared to be in respiratory distress when I got bedside report from the day shift nurse. He was having labored breathing, accessory muscle use, nasal faring, pursed lips, diaphoretic, RR in the 30's to 40's satting in the 70's to 80's, coarse crackles, wheezes through out, and his skin was a bluish red on his chest.  I increased the Venti mask from 6-35% to 8-40% satting in the uppers 80's. During the assessment the client had an SVT run (strip saved). I paged cardiology at this time and asked her to come see the client.   Ciccotto from cardiology came to the unit and assessed the client she placed orders for a BiPAP, 40 of IV lasix, CXR, ABG, discussed code status and consulted pulmonary critical care.  Updated wife of client changes.   Addendum: Pulmonary critical care came to see the client and ordered solumedrol and scheduled nebs, discussed PRN morphine if respiratory status worsened and changed code status to DNR. I will continue to monitor the client closely.   Updated wife of Code status change.  Quincy Sheehan RN

## 2015-06-06 NOTE — Consult Note (Signed)
Consultation Note Date: 06/06/2015   Patient Name: Joshua Bullock  DOB: 20-May-1932  MRN: [id number]  Age / Sex: 82 y.o., male  PCP: No primary care provider on file. Referring Physician: Jay Schlichter, MD  Reason for Consultation: Establishing goals of care  82 yo male with advanced interstitial lung disease and paroxysmal atrial fibrillation (on Eliquis) who was hospitalized 3/4 - 3/7 at [facility] for an exacerbation of ILD.  He presented to [facility] ER with worsening shortness of breath and was found to be in rapid atrial flutter.  Chemical cardioversion was unsuccessful.  The patient underwent DCCV on 3/14 and has maintained sinus rhythm.  Unfortunately he developed acute respiratory distress and required bipap.  Currently he is on 25L of high flow nasal cannula.  Clinical Assessment/Narrative: I spoke with Joshua Bullock and the patient's brother at bedside.  Despite the stress of the patient's medical condition the family seems to be mentally in a good place.  Joshua Bullock states "it is not in our hands" referring to God as being in control.  The Bullock's have planned well - the HCPOA is complete, the living will is complete, the ceremony has been planned and the burial plots purchased.  Joshua Bullock has even outlined their obituaries so that their son will not have the stress of starting from scratch.  We discussed the concept of comfort care and the family understood the concept.  Joshua Bullock did awaken during our conversation - I asked him whether he was more focused on quality of life or quantity of days.  His response was - I'm easy going, I'll take whatever.  He deferred to his wife to make his decisions.  Joshua Bullock currently is not doing well.  Without the high flow nasal cannula he would not survive.  I'm hopeful that he will remain in sinus rhythm and that the ILD will continue to be steroid  responsive.  He was started on high dose solumedrol at approximately midnight last night.  It would make sense to allow a period of time to determine whether or not his breathing will improve and he can be weaned from the HFNC.  If not, I will speak with the family about shifting to total comfort.  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  He and his wife Joshua Bullock have 1 son.  Contacts/Participants in Discussion:  Wife Joshua Bullock, and patient Primary Decision Maker: Wife Joshua Bullock HCPOA: Joshua Bullock   SUMMARY OF RECOMMENDATIONS  Code Status/Advance Care Planning: DNR  Other Directives:Living Will  Symptom Management:   Would consider very low dose Morphine PRN dyspnea.  Will order and let Joshua Bullock know.   Additional Recommendations (Limitations, Scope, Preferences):  Full Scope Treatment with the exception of DNR / DNI   Psycho-social/Spiritual:  Support System: Strong Desire for further Chaplaincy support: Yes,  He is a member of [employer] Additional Recommendations: Caregiving  Support/Resources  Prognosis: Unable to determine.  He is at high risk for an acute event this hospitalization.  If he survives this hospitalization, he is eligible for hospice given his respiratory status.  Discharge Planning:  I do not believe SNFs in the area take HFNC.  We will need to wait until he weans off of it or shift to comfort care.  My best guess at this point is to be hopeful that he will return to [facility] to continue rehab.   Chief Complaint/ Primary  Diagnoses: Rapid Atrial Flutter  I have reviewed the medical record, interviewed the patient and family, and examined the patient. The following aspects are pertinent.  Past Medical History  Diagnosis Date  . Atrial fibrillation ([place])   . Pulmonary fibrosis ([place])   . Pulmonary fibrosis ([place]) 06/04/2015   Social History   Social History  .  Marital Status: Married    Spouse Name: N/A  . Number of Children: N/A  . Years of Education: N/A   Social History Main Topics  . Smoking status: Former Research scientist (life sciences)  . Smokeless tobacco: Not on file  . Alcohol Use: No  . Drug Use: No  . Sexual Activity: Not on file   Other Topics Concern  . Not on file   Social History Narrative   Family History  Problem Relation Age of Onset  . Hypertension Father    Scheduled Meds: . apixaban  5 mg Oral BID  . azelastine  2 spray Each Nare BID  . citalopram  20 mg Oral Daily  . diltiazem  180 mg Oral Daily  . flecainide  50 mg Oral Q12H  . fluticasone  1 spray Each Nare Daily  . ipratropium-albuterol  3 mL Nebulization Q6H  . methylPREDNISolone (SOLU-MEDROL) injection  125 mg Intravenous Q6H  . multivitamin with minerals  1 tablet Oral Daily  . pantoprazole  40 mg Oral Daily  . pravastatin  40 mg Oral Daily  . sodium chloride flush  3 mL Intravenous Q12H   Continuous Infusions: . sodium chloride     PRN Meds:.fentaNYL (SUBLIMAZE) injection, promethazine, sodium chloride flush Medications Prior to Admission:  Prior to Admission medications   Medication Sig Start Date End Date Taking? Authorizing Provider  apixaban (ELIQUIS) 5 MG TABS tablet Take 5 mg by mouth 2 (two) times daily.   Yes Historical Provider, MD  aspirin 81 MG tablet Take 81 mg by mouth daily.   Yes Historical Provider, MD  azelastine (ASTELIN) 0.1 % nasal spray Place 2 sprays into both nostrils 2 (two) times daily. Use in each nostril as directed   Yes Historical Provider, MD  CALCIUM-VITAMIN D PO Take 1 tablet by mouth daily.   Yes Historical Provider, MD  citalopram (CELEXA) 20 MG tablet Take 20 mg by mouth daily.   Yes Historical Provider, MD  fluticasone (FLONASE) 50 MCG/ACT nasal spray Place 1 spray into both nostrils daily.   Yes Historical Provider, MD  Fluticasone-Salmeterol (ADVAIR) 250-50 MCG/DOSE AEPB Inhale 1 puff into the lungs every 12 (twelve) hours.   Yes  Historical Provider, MD  hydrocortisone cream 1 % Apply 1 application topically 2 (two) times daily as needed for itching.   Yes Historical Provider, MD  Multiple Vitamin (MULTIVITAMIN WITH MINERALS) TABS tablet Take 1 tablet by mouth daily.   Yes Historical Provider, MD  Multiple Vitamins-Minerals (PRESERVISION AREDS 2) CAPS Take 2 capsules by mouth 2 (two) times daily.   Yes Historical Provider, MD  omeprazole (PRILOSEC) 20 MG capsule Take 20 mg by mouth daily.   Yes Historical Provider, MD  pravastatin (PRAVACHOL) 40 MG tablet Take 40 mg by mouth daily.   Yes Historical Provider, MD  predniSONE (STERAPRED UNI-PAK 21 TAB) 10 MG (21) TBPK tablet Take 10 mg by mouth daily. 4 tablets for 2 days, then 3 tabs for 2 days, 2 tabs for 2 days, then 1 tab daily   Yes Historical Provider, MD  Testosterone 20.25 MG/ACT (1.62%) GEL Place 5 application onto the skin daily. Dose: 5 pumps on  the skin every day   Yes Historical Provider, MD   Allergies  Allergen Reactions  . Influenza Vaccines Anaphylaxis  . Tetanus Toxoids Anaphylaxis    Review of Systems:  Patient denies pain, anxiety, but unable to give a full review of systems as he desats when he speaks.  Physical Exam  Wd, weak, thin male, awake, cooperative, pleasant HEENT:  AT/[place] PER Neck:   Supple, no JVD Lungs:  Diffuse crackles, slightly rapid breathing CV:  RRR Abdomen:  Soft, Nt, Nd, +bs Ext:  Able to move all 4, no edema.   Vital Signs: BP 129/90 mmHg  Pulse 89  Temp(Src) 98.2 F (36.8 C) (Axillary)  Resp 26  Ht 5' 10.5" (1.791 m)  Wt 71.124 kg (156 lb 12.8 oz)  BMI 22.17 kg/m2  SpO2 98%  SpO2: SpO2: 98 % O2 Device:SpO2: 98 % O2 Flow Rate: .O2 Flow Rate (L/min): 20 L/min  IO: Intake/output summary:  Intake/Output Summary (Last 24 hours) at 06/06/15 1651 Last data filed at 06/06/15 1217  Gross per 24 hour  Intake    853 ml  Output   1200 ml  Net   -347 ml    LBM: Last BM Date: 06/05/15 Baseline Weight: Weight: 81.194  kg (179 lb) Most recent weight: Weight: 71.124 kg (156 lb 12.8 oz)      Palliative Assessment/Data:  Flowsheet Rows        Most Recent Value   Intake Tab    Referral Department  Cardiology   Unit at Time of Referral  Intermediate Care Unit   Palliative Care Primary Diagnosis  Pulmonary   Date Notified  06/05/15   Palliative Care Type  New Palliative care   Reason for referral  Clarify Goals of Care   Date of Admission  06/06/15   Date first seen by Palliative Care  06/06/15   # of days Palliative referral response time  1 Day(s)   # of days IP prior to Palliative referral  -1   Clinical Assessment    Palliative Performance Scale Score  30%   Dyspnea Max Last 24 Hours  8   Dyspnea Min Last 24 hours  2   Psychosocial & Spiritual Assessment    Palliative Care Outcomes    Patient/Family meeting held?  Yes   Who was at the meeting?  Wife, brother and patient   Palliative Care Outcomes  Clarified goals of care      Additional Data Reviewed:  CBC:    Component Value Date/Time   WBC 27.1* 06/06/2015 0420   HGB 17.2* 06/06/2015 0420   HCT 51.9 06/06/2015 0420   PLT 267 06/06/2015 0420   MCV 88.1 06/06/2015 0420   NEUTROABS 25.1* 06/06/2015 0420   LYMPHSABS 1.5 06/06/2015 0420   MONOABS 0.5 06/06/2015 0420   EOSABS 0.0 06/06/2015 0420   BASOSABS 0.0 06/06/2015 0420   Comprehensive Metabolic Panel:    Component Value Date/Time   NA 135 06/06/2015 0420   K 4.2 06/06/2015 0420   CL 92* 06/06/2015 0420   CO2 30 06/06/2015 0420   BUN 25* 06/06/2015 0420   CREATININE 0.99 06/06/2015 0420   GLUCOSE 118* 06/06/2015 0420   CALCIUM 8.6* 06/06/2015 0420     Time In: 4:00 Time Out: 5:10 Time Total: 70 Greater than 50%  of this time was spent counseling and coordinating care related to the above assessment and plan.  Signed by: Imogene Burn, PA-C Palliative Medicine Pager: [phone]  06/06/2015, 4:51 PM  Please contact  Palliative Medicine Team phone at [phone] for  questions and concerns.

## 2015-06-06 NOTE — Progress Notes (Signed)
UR Completed Ligaya Cormier Graves-Bigelow, RN,BSN [phone]  

## 2015-06-06 NOTE — Clinical Social Work Placement (Signed)
   CLINICAL SOCIAL WORK PLACEMENT  NOTE  Date:  06/06/2015  Patient Details  Name: Joshua Bullock MRN: [id number] Date of Birth: 1933/01/23  Clinical Social Work is seeking post-discharge placement for this patient at the [place] level of care (*CSW will initial, date and re-position this form in  chart as items are completed):  Yes   Patient/family provided with [place] Work Department's list of facilities offering this level of care within the geographic area requested by the patient (or if unable, by the patient's family).  Yes   Patient/family informed of their freedom to choose among providers that offer the needed level of care, that participate in Medicare, Medicaid or managed care program needed by the patient, have an available bed and are willing to accept the patient.  Yes   Patient/family informed of [facility]'s ownership interest in [facility] and [facility], as well as of the fact that they are under no obligation to receive care at these facilities.  PASRR submitted to EDS on       PASRR number received on       Existing PASRR number confirmed on 06/06/15     FL2 transmitted to all facilities in geographic area requested by pt/family on 06/06/15     FL2 transmitted to all facilities within larger geographic area on       Patient informed that his/her managed care company has contracts with or will negotiate with certain facilities, including the following:            Patient/family informed of bed offers received.  Patient chooses bed at       Physician recommends and patient chooses bed at      Patient to be transferred to   on  .  Patient to be transferred to facility by       Patient family notified on   of transfer.  Name of family member notified:        PHYSICIAN Please prepare priority discharge summary, including medications, Please prepare prescriptions, Please sign FL2, Please sign DNR      Additional Comment:    _______________________________________________ Rigoberto Noel, LCSW 06/06/2015, 3:21 PM

## 2015-06-06 NOTE — Plan of Care (Signed)
Problem: Phase I Progression Outcomes Goal: O2 sats > or equal 90% or at baseline Outcome: Progressing Pt off of bipap, now on high flow nasal canula Goal: Progress activity as tolerated unless otherwise ordered Outcome: Not Progressing Unable due to sob

## 2015-06-06 NOTE — NC FL2 (Signed)
[place] LEVEL OF CARE SCREENING TOOL     IDENTIFICATION  Patient Name: Joshua Bullock Birthdate: 1932/10/28 Sex: male Admission Date (Current Location): 06/04/2015  [facility] and [place] Number:  Herbalist and Address:  [facility], [place] [address] [place], [place] [address]      Provider Number: [id number]  Attending Physician Name and Address:  Jay Schlichter, MD  Relative Name and Phone Number:       Current Level of Care: Hospital Recommended Level of Care: [place] Prior Approval Number:    Date Approved/Denied:   PASRR Number:    Discharge Plan: SNF    Current Diagnoses: Patient Active Problem List   Diagnosis Date Noted  . Acute respiratory failure with hypoxia ([place])   . Pulmonary fibrosis ([place]) 06/04/2015  . Interstitial lung disease ([place]) 06/04/2015  . PAF (paroxysmal atrial fibrillation) ([place]) 06/04/2015  . Atrial flutter with rapid ventricular response ([place]) 06/04/2015  . Chronic anticoagulation - Eliquis 06/04/2015  . Atrial flutter ([place]) 06/04/2015    Orientation RESPIRATION BLADDER Height & Weight     Self, Situation, Place  O2, Other (Comment) (HFNC 30%  50L/min) Continent Weight: 156 lb 12.8 oz (71.124 kg) Height:  5' 10.5" (179.1 cm)  BEHAVIORAL SYMPTOMS/MOOD NEUROLOGICAL BOWEL NUTRITION STATUS  Other (Comment) (no behaviors noted)   Continent Diet (normal)  AMBULATORY STATUS COMMUNICATION OF NEEDS Skin   Extensive Assist Verbally Normal                       Personal Care Assistance Level of Assistance  Bathing, Feeding, Dressing Bathing Assistance: Limited assistance Feeding assistance: Limited assistance Dressing Assistance: Limited assistance     Functional Limitations Info  Sight, Hearing, Speech Sight Info: Adequate Hearing Info: Adequate Speech Info: Adequate    SPECIAL CARE FACTORS FREQUENCY                       Contractures      Additional  Factors Info  Code Status, Allergies, Psychotropic, Insulin Sliding Scale, Isolation Precautions Code Status Info: DNR Allergies Info: flu vaccine, tetanus toxoids Psychotropic Info: Celexa: mood/depression Insulin Sliding Scale Info: none Isolation Precautions Info: none     Current Medications (06/06/2015):  This is the current hospital active medication list Current Facility-Administered Medications  Medication Dose Route Frequency Provider Last Rate Last Dose  . 0.9 %  sodium chloride infusion  250 mL Intravenous Continuous Brittainy M Simmons, PA-C      . apixaban (ELIQUIS) tablet 5 mg  5 mg Oral BID Jay Schlichter, MD   5 mg at 06/06/15 1002  . azelastine (ASTELIN) 0.1 % nasal spray 2 spray  2 spray Each Nare BID Jay Schlichter, MD   2 spray at 06/06/15 1003  . citalopram (CELEXA) tablet 20 mg  20 mg Oral Daily Jay Schlichter, MD   20 mg at 06/06/15 1002  . diltiazem (CARDIZEM CD) 24 hr capsule 180 mg  180 mg Oral Daily Patsey Berthold, NP   180 mg at 06/06/15 1002  . fentaNYL (SUBLIMAZE) injection 25-50 mcg  25-50 mcg Intravenous Q5 min PRN Myrtie Soman, MD      . flecainide [facility]) tablet 50 mg  50 mg Oral Q12H Patsey Berthold, NP   50 mg at 06/06/15 1002  . fluticasone (FLONASE) 50 MCG/ACT nasal spray 1 spray  1 spray Each Nare Daily Jay Schlichter, MD   1 spray at  06/06/15 1003  . ipratropium-albuterol (DUONEB) 0.5-2.5 (3) MG/3ML nebulizer solution 3 mL  3 mL Nebulization Q6H Corey Harold, NP   3 mL at 06/06/15 0754  . methylPREDNISolone sodium succinate (SOLU-MEDROL) 125 mg/2 mL injection 125 mg  125 mg Intravenous Q6H Corey Harold, NP   125 mg at 06/06/15 1002  . multivitamin with minerals tablet 1 tablet  1 tablet Oral Daily Jay Schlichter, MD   1 tablet at 06/06/15 1002  . pantoprazole (PROTONIX) EC tablet 40 mg  40 mg Oral Daily Jay Schlichter, MD   40 mg at 06/06/15 1002  . pravastatin (PRAVACHOL) tablet 40 mg  40 mg Oral Daily Jay Schlichter, MD   40 mg at 06/06/15 1002  .  promethazine (PHENERGAN) injection 6.25-12.5 mg  6.25-12.5 mg Intravenous Q15 min PRN Myrtie Soman, MD      . sodium chloride flush (NS) 0.9 % injection 3 mL  3 mL Intravenous Q12H Brittainy M Simmons, PA-C   3 mL at 06/06/15 1004  . sodium chloride flush (NS) 0.9 % injection 3 mL  3 mL Intravenous PRN Brittainy Erie Noe, PA-C         Discharge Medications: Please see discharge summary for a list of discharge medications.  Relevant Imaging Results:  Relevant Lab Results:   Additional Information    Lilly [place], LCSW

## 2015-06-07 ENCOUNTER — Encounter (HOSPITAL_COMMUNITY): Payer: Medicare Other

## 2015-06-07 LAB — BASIC METABOLIC PANEL
Anion gap: 11 (ref 5–15)
BUN: 29 mg/dL — AB (ref 6–20)
CO2: 28 mmol/L (ref 22–32)
CREATININE: 0.91 mg/dL (ref 0.61–1.24)
Calcium: 8.6 mg/dL — ABNORMAL LOW (ref 8.9–10.3)
Chloride: 94 mmol/L — ABNORMAL LOW (ref 101–111)
GFR calc Af Amer: >60 mL/min (ref >60)
GLUCOSE: 169 mg/dL — AB (ref 65–99)
Potassium: 4.2 mmol/L (ref 3.5–5.1)
SODIUM: 133 mmol/L — AB (ref 135–145)

## 2015-06-07 LAB — CBC WITH DIFFERENTIAL/PLATELET
BASOS ABS: 0 10*3/uL (ref 0.0–0.1)
Basophils Relative: 0 %
EOS ABS: 0 10*3/uL (ref 0.0–0.7)
EOS PCT: 0 %
HCT: 45.8 % (ref 39.0–52.0)
Hemoglobin: 16.1 g/dL (ref 13.0–17.0)
LYMPHS ABS: 1.8 10*3/uL (ref 0.7–4.0)
Lymphocytes Relative: 7 %
MCH: 30.8 pg (ref 26.0–34.0)
MCHC: 35.2 g/dL (ref 30.0–36.0)
MCV: 87.6 fL (ref 78.0–100.0)
MONO ABS: 0.6 10*3/uL (ref 0.1–1.0)
Monocytes Relative: 2 %
Neutro Abs: 22 10*3/uL — ABNORMAL HIGH (ref 1.7–7.7)
Neutrophils Relative %: 91 %
PLATELETS: 256 10*3/uL (ref 150–400)
RBC: 5.23 MIL/uL (ref 4.22–5.81)
RDW: 14.9 % (ref 11.5–15.5)
WBC: 24.4 10*3/uL — AB (ref 4.0–10.5)

## 2015-06-07 LAB — PROTIME-INR
INR: 1.45 (ref 0.00–1.49)
PROTHROMBIN TIME: 17.7 s — AB (ref 11.6–15.2)

## 2015-06-07 MED ORDER — MORPHINE SULFATE (CONCENTRATE) 10 MG/0.5ML PO SOLN
4.0000 mg | Freq: Once | ORAL | Status: AC
  Administered 2015-06-07: 4 mg via ORAL
  Filled 2015-06-07: qty 0.5

## 2015-06-07 MED ORDER — MORPHINE SULFATE 10 MG/5ML PO SOLN: 4.0000 mg | Freq: Once | ORAL | Status: DC

## 2015-06-07 NOTE — Progress Notes (Signed)
Name: Joshua Bullock MRN: [id number] DOB: 10/16/1932    ADMISSION DATE:  06/04/2015 CONSULTATION DATE:  06/05/2015  REFERRING MD :  Cardiology  CHIEF COMPLAINT:  Dyspnea   SUBJECTIVE:  Still requiring HFNC, reports he doesn't feel a whole lot better.  Feels OK at rest but with minimal exertion (even something like sitting up to eat), he gets dyspneic.  Met with palliative care 03/15 - DNR confirmed.  Family has planned well; essentially all aspects of funeral service / burial / obituaries have been planned.  Plan per palliative care note is to allow short time to see whether steroids can help and pt can be weaned off of HFNC.  If not, plan of care likely to transition towards comfort. Palliative care has discussed case with Dr. Chase Caller who has followed Mr. Neilan as outpatient regarding trying to possibly get him to [facility] (possible accepting lower SpO2 levels, etc).  VITAL SIGNS: Temp:  [97.4 F (36.3 C)-98 F (36.7 C)] 97.6 F (36.4 C) (03/16 0910) Pulse Rate:  [57-73] 57 (03/16 0500) Resp:  [18-19] 19 (03/16 0500) BP: (121-127)/(73-80) 121/73 mmHg (03/16 0500) SpO2:  [93 %-100 %] 93 % (03/16 0906) FiO2 (%):  [30 %-50 %] 50 % (03/16 0906) Weight:  [71.079 kg (156 lb 11.2 oz)] 71.079 kg (156 lb 11.2 oz) (03/16 0500)  PHYSICAL EXAMINATION:  General:  Chronically ill appearing male in respiratory distress on BiPAP Neuro:  Alert, oriented, non-focal HEENT:  [place]/AT, PERRL, no JVD noted Cardiovascular: S1S2, no MRG Lungs:  Coarse, obvious distress on BiPAP Abdomen:  Soft, non-tender, non-distended Musculoskeletal:  No acute deformity or ROM limiation Skin:  Grossly intact   Recent Labs Lab 06/05/15 0449 06/06/15 0420 06/07/15 0530  NA 134* 135 133*  K 4.5 4.2 4.2  CL 97* 92* 94*  CO2 '26 30 28  '$ BUN 27* 25* 29*  CREATININE 0.84 0.99 0.91  GLUCOSE 98 118* 169*    Recent Labs Lab 06/05/15 0449 06/06/15 0420 06/07/15 0530  HGB 15.3 17.2* 16.1  HCT 46.4  51.9 45.8  WBC 21.5* 27.1* 24.4*  PLT 253 267 256   Dg Chest Port 1 View  06/05/2015  CLINICAL DATA:  Acute onset of respiratory distress. Initial encounter. EXAM: PORTABLE CHEST 1 VIEW COMPARISON:  Chest radiograph from 06/04/2015 FINDINGS: The lungs are hypoexpanded. Diffuse bilateral airspace opacification reflects the patient's known pulmonary fibrosis. Superimposed infection cannot be excluded. No definite pleural effusion or pneumothorax is identified. The cardiomediastinal silhouette is borderline normal in size. No acute osseous abnormalities are seen. IMPRESSION: Lungs hypoexpanded. Diffuse bilateral airspace opacification reflects patient's known pulmonary fibrosis. Superimposed infection cannot be excluded. Electronically Signed   By: Garald Balding M.D.   On: 06/05/2015 21:19    ASSESSMENT / PLAN:  Acute on chronic hypoxemic respiratory failure - Likely in setting of IPF flare.  Despite leukocytosis, doubt infectious process (favor steroid induced). Cardiology is actively [place].  Pulmonary fibrosis  DNI / DNR Status Plan: Continue HFNC as he has been intolerant of weaning. Continue to attempt weaning (goal SpO2 low 80's; will defer lower levels of acceptance to Dr. Chase Caller)- if unable to wean off then agree comfort care would be best plan (especially given that his disposition would be challenging given that there are no local SNF's that provide HFNC.  If we are able to get him into SpO2 of low 80's, then can hopefully get him discharged to [facility] to allow him to spend his last days with family. PRN BiPAP  as needed. Continue steroids - '125mg'$  Solumedrol q 6 hours for now; would gradually wean. Continue BD's. Consider morphine PRN.  Atrial flutter with rapid ventricular response s/p DCCV 3/14 Per cardiology   Montey Hora, [place] [facility] Pager: [phone]  or [phone] 06/07/2015, 11:04 AM

## 2015-06-07 NOTE — Progress Notes (Signed)
Daily Progress Note   Patient Name: Joshua Bullock       Date: 06/07/2015 DOB: December 30, 1932  Age: 82 y.o. MRN#: [id number] Attending Physician: Joshua Schlichter, MD Primary Care Physician: No primary care provider on file. Admit Date: 06/04/2015  Reason for Consultation/Follow-up: Establishing goals of care and Non pain symptom management  Subjective:  Feeling ok.  No complaints.     Interval Events:  We discussed HFNC and that in order to leave the hospital he would need to be able to be down below 6L (what SNF will accept).  We had a difficult conversation around what may happen if the HFNC can not be weaned down.  The patient and his wife are very saddened but have prepared themselves well for Joshua Bullock passing if he is unable to survive with out HFNC.     Mrs. Cypert indicted to me that she is unable to care for him at home.  He would be appropriate for [facility] if he has less than 2 weeks to live.  The patient has geographically dispersed family members:  Brother in [place], Sister in [place], Sister in [place].  The family would likely need a few days to gather.    I discussed Joshua Bullock health with Joshua Bullock who knows the patient well.  We discussed steroid therapy, morphine, accepting a lower pulse ox (70 - 75%) - ideas to get him comfortably to [facility] and to allow him some time with his family.   Length of Stay: 3 days  Current Medications: Scheduled Meds:  . apixaban  5 mg Oral BID  . azelastine  2 spray Each Nare BID  . citalopram  20 mg Oral Daily  . diltiazem  180 mg Oral Daily  . flecainide  50 mg Oral Q12H  . fluticasone  1 spray Each Nare Daily  . ipratropium-albuterol  3 mL Nebulization Q6H  . methylPREDNISolone (SOLU-MEDROL)  injection  125 mg Intravenous Q6H  . multivitamin with minerals  1 tablet Oral Daily  . pantoprazole  40 mg Oral Daily  . pravastatin  40 mg Oral Daily  . sodium chloride flush  3 mL Intravenous Q12H    Continuous Infusions: . sodium chloride      PRN Meds: fentaNYL (SUBLIMAZE) injection, morphine injection, promethazine, sodium chloride flush  Physical Exam       Wd, Thin, frail, very pleasant, Alert  CV RRR Resp:  SOB with speaking.  HFNC at 25L Ext:  No edema, able to move all 4.        Vital Signs: BP 121/73 mmHg  Pulse 57  Temp(Src) 97.6 F (36.4 C) (Oral)  Resp 19  Ht 5' 10.5" (1.791 m)  Wt 71.079 kg (156 lb 11.2 oz)  BMI 22.16 kg/m2  SpO2 93% SpO2: SpO2: 93 % O2 Device: O2 Device: High Flow Nasal Cannula O2 Flow Rate: O2 Flow Rate (L/min): 30 L/min  Intake/output summary:  Intake/Output Summary (Last 24 hours) at 06/07/15 1042 Last data filed at 06/07/15 [id number]  Gross per 24 hour  Intake    440 ml  Output   1450 ml  Net  -1010 ml   LBM: Last BM Date: 06/05/15 Baseline Weight: Weight: 81.194 kg (179 lb) Most recent weight: Weight: 71.079 kg (156 lb 11.2 oz)       Palliative Assessment/Data: Flowsheet Rows        Most Recent Value   Intake Tab    Referral Department  Cardiology   Unit at Time of Referral  Intermediate Care Unit   Palliative Care Primary Diagnosis  Pulmonary   Date Notified  06/05/15   Palliative Care Type  New Palliative care   Reason for referral  Clarify Goals of Care   Date of Admission  06/06/15   Date first seen by Palliative Care  06/06/15   # of days Palliative referral response time  1 Day(s)   # of days IP prior to Palliative referral  -1   Clinical Assessment    Palliative Performance Scale Score  30%   Dyspnea Max Last 24 Hours  8   Dyspnea Min Last 24 hours  2   Psychosocial & Spiritual Assessment    Palliative Care Outcomes    Patient/Family meeting held?  Yes   Who was at the meeting?  Wife, brother and patient    Palliative Care Outcomes  Clarified goals of care      Additional Data Reviewed: CBC    Component Value Date/Time   WBC 24.4* 06/07/2015 0530   RBC 5.23 06/07/2015 0530   HGB 16.1 06/07/2015 0530   HCT 45.8 06/07/2015 0530   PLT 256 06/07/2015 0530   MCV 87.6 06/07/2015 0530   MCH 30.8 06/07/2015 0530   MCHC 35.2 06/07/2015 0530   RDW 14.9 06/07/2015 0530   LYMPHSABS 1.8 06/07/2015 0530   MONOABS 0.6 06/07/2015 0530   EOSABS 0.0 06/07/2015 0530   BASOSABS 0.0 06/07/2015 0530    CMP     Component Value Date/Time   NA 133* 06/07/2015 0530   K 4.2 06/07/2015 0530   CL 94* 06/07/2015 0530   CO2 28 06/07/2015 0530   GLUCOSE 169* 06/07/2015 0530   BUN 29* 06/07/2015 0530   CREATININE 0.91 06/07/2015 0530   CALCIUM 8.6* 06/07/2015 0530   GFRNONAA >60 06/07/2015 0530   GFRAA >60 06/07/2015 0530       Problem List:  Patient Active Problem List   Diagnosis Date Noted  . Acute respiratory failure with hypoxia ([place])   . Hypoxia   . Acute on chronic respiratory failure with hypoxia ([place])   . Dyspnea   . Anxiety state   . Palliative care encounter   . Goals of care, counseling/discussion   . Pulmonary fibrosis ([place]) 06/04/2015  . Interstitial lung disease ([place]) 06/04/2015  .  PAF (paroxysmal atrial fibrillation) ([place]) 06/04/2015  . Atrial flutter with rapid ventricular response ([place]) 06/04/2015  . Chronic anticoagulation - Eliquis 06/04/2015  . Atrial flutter ([place]) 06/04/2015     Palliative Care Assessment & Plan    1.Code Status:  DNR    Code Status Orders        Start     Ordered   06/05/15 2209  Do not attempt resuscitation (DNR)   Continuous    Question Answer Comment  In the event of cardiac or respiratory ARREST Do not call a "code blue"   In the event of cardiac or respiratory ARREST Do not perform Intubation, CPR, defibrillation or ACLS   In the event of cardiac or respiratory ARREST Use medication by any route, position, wound care, and other  measures to relive pain and suffering. May use oxygen, suction and manual treatment of airway obstruction as needed for comfort.      06/05/15 2208    Code Status History    Date Active Date Inactive Code Status Order ID Comments User Context   This patient has a current code status but no historical code status.       2. Goals of Care/Additional Recommendations:  DNR / DNI  Will ask Pulm to offer recommendations, but it is anticipated that he will need Residential Hospice Services.  3. Symptom Management:      1. Morphine PRN dyspnea.    . Prognosis: < 2 weeks without HFNC  6. Discharge Planning:  Hospice facility   Care plan was discussed with  PCCM, Family at bedside.  Thank you for allowing the Palliative Medicine Team to assist in the care of this patient.   Time In: 10:00` ` 10:58 Total Time 58 Prolonged Time Billed  no        Imogene Burn, [place] Palliative Medicine Pager: [phone]  06/07/2015, 10:42 AM  Please contact Palliative Medicine Team phone at [phone] for questions and concerns.

## 2015-06-07 NOTE — Addendum Note (Signed)
Encounter addended by: Lance Morin, RN on: 06/07/2015  9:58 AM<BR>     Documentation filed: Clinical Notes

## 2015-06-07 NOTE — Clinical Social Work Note (Signed)
Patient can return to [facility] as long as a bed is available at discharge. Currently patient is not medically stable for d/c due to HFNC.   CSW will continue to keep facility updated, provide continued support to patient/family and facilitate pt's discharge needs once medically stable.   Glendon Axe, MSW, LCSWA [phone] 06/07/2015 2:59 PM

## 2015-06-07 NOTE — Care Management Note (Signed)
Case Management Note  Patient Details  Name: Joshua Bullock MRN: [id number] Date of Birth: 04/05/1932  Subjective/Objective:  Pt admitted for Afib. Acute on chronic hypoxemic respiratory failure-Continues on High Flow 02/ IV Solumedrol. Pulmonary following along with Consult to Palliative Care. Pt is a DNR. Family at bedside.                   Action/Plan: CM will continue to monitor for disposition needs.    Expected Discharge Date:                  Expected Discharge Plan:  [place]  In-House Referral:  Clinical Social Work  Discharge planning Services  CM Consult  Post Acute Care Choice:    Choice offered to:     DME Arranged:    DME Agency:     HH Arranged:    [place] Agency:     Status of Service:  In process, will continue to follow  Medicare Important Message Given:    Date Medicare IM Given:    Medicare IM give by:    Date Additional Medicare IM Given:    Additional Medicare Important Message give by:     If discussed at [place] of Stay Meetings, dates discussed:    Additional Comments:  Bethena Roys, RN 06/07/2015, 12:17 PM

## 2015-06-07 NOTE — Progress Notes (Signed)
Pulmonary Rehab Discharge Note  Joshua Bullock has been discharged as of 05/29/2015 due to a change in medical condition.  He was hospitalized at [facility] for a deterioration in pulmonary status and was discharged to a skilled nursing facility.  He only exercised 1 session with [place] and did not meet any program or personal goals.  He is not well enough to attend pulmonary rehab.

## 2015-06-07 NOTE — Progress Notes (Signed)
Patient Name: Joshua Bullock Date of Encounter: 06/07/2015  Hospital Problem List     Principal Problem:   Atrial flutter with rapid ventricular response ([place]) Active Problems:   Acute respiratory failure with hypoxia (HCC)   Pulmonary fibrosis (HCC)   Interstitial lung disease (HCC)   PAF (paroxysmal atrial fibrillation) (HCC)   Chronic anticoagulation - Eliquis   Hypoxia   Acute on chronic respiratory failure with hypoxia (HCC)   Dyspnea   Anxiety state   Palliative care encounter   Goals of care, counseling/discussion    Subjective   Alert, sitting up in bed eating breakfast.  Feels "much better" today.  Continued on HFNC.  No SOB or chest discomfort.  NSR 60-70's; bp stable.    Inpatient Medications    . apixaban  5 mg Oral BID  . azelastine  2 spray Each Nare BID  . citalopram  20 mg Oral Daily  . diltiazem  180 mg Oral Daily  . flecainide  50 mg Oral Q12H  . fluticasone  1 spray Each Nare Daily  . ipratropium-albuterol  3 mL Nebulization Q6H  . methylPREDNISolone (SOLU-MEDROL) injection  125 mg Intravenous Q6H  . multivitamin with minerals  1 tablet Oral Daily  . pantoprazole  40 mg Oral Daily  . pravastatin  40 mg Oral Daily  . sodium chloride flush  3 mL Intravenous Q12H    Vital Signs    Filed Vitals:   06/06/15 2124 06/07/15 0100 06/07/15 0256 06/07/15 0500  BP:  127/78  121/73  Pulse:  68  57  Temp:  97.9 F (36.6 C)  97.8 F (36.6 C)  TempSrc:  Oral  Oral  Resp:    19  Height:      Weight:    156 lb 11.2 oz (71.079 kg)  SpO2: 99% 96% 96% 100%    Intake/Output Summary (Last 24 hours) at 06/07/15 0742 Last data filed at 06/07/15 0616  Gross per 24 hour  Intake    440 ml  Output   1450 ml  Net  -1010 ml   Filed Weights   06/05/15 1839 06/06/15 0432 06/07/15 0500  Weight: 161 lb 6.4 oz (73.211 kg) 156 lb 12.8 oz (71.124 kg) 156 lb 11.2 oz (71.079 kg)    Physical Exam    General: Pleasant, chronically appearing male NAD. Neuro:  Alert and oriented X 3. Moves all extremities spontaneously; generalized weakness. Psych: Normal affect. HEENT:  Normal  Neck: Supple without bruits or JVD. Lungs:  Resp regular, tachypneic in the 20's, comfortable on HFNC, scattered mild crackles Heart: RRR no s3, s4, or murmurs. Abdomen: Soft, non-tender, non-distended, BS + x 4.  Extremities: No clubbing, cyanosis or edema. DP/PT/Radials 2+ and equal bilaterally. Warm/Dry.    Labs    CBC  Recent Labs  06/06/15 0420 06/07/15 0530  WBC 27.1* 24.4*  NEUTROABS 25.1* 22.0*  HGB 17.2* 16.1  HCT 51.9 45.8  MCV 88.1 87.6  PLT 267 [id number]   Basic Metabolic Panel  Recent Labs  06/06/15 0420 06/07/15 0530  NA 135 133*  K 4.2 4.2  CL 92* 94*  CO2 30 28  GLUCOSE 118* 169*  BUN 25* 29*  CREATININE 0.99 0.91  CALCIUM 8.6* 8.6*   Liver Function Tests No results for input(s): AST, ALT, ALKPHOS, BILITOT, PROT, ALBUMIN in the last 72 hours. No results for input(s): LIPASE, AMYLASE in the last 72 hours. Cardiac Enzymes No results for input(s): CKTOTAL, CKMB, CKMBINDEX, TROPONINI in the last 72  hours. BNP Invalid input(s): POCBNP D-Dimer No results for input(s): DDIMER in the last 72 hours. Hemoglobin A1C No results for input(s): HGBA1C in the last 72 hours. Fasting Lipid Panel No results for input(s): CHOL, HDL, LDLCALC, TRIG, CHOLHDL, LDLDIRECT in the last 72 hours. Thyroid Function Tests  Recent Labs  06/05/15 0450  TSH 0.670    Telemetry    NSR 60-70s, occasional PVC   Radiology    Dg Chest Port 1 View  06/05/2015  CLINICAL DATA:  Acute onset of respiratory distress. Initial encounter. EXAM: PORTABLE CHEST 1 VIEW COMPARISON:  Chest radiograph from 06/04/2015 FINDINGS: The lungs are hypoexpanded. Diffuse bilateral airspace opacification reflects the patient's known pulmonary fibrosis. Superimposed infection cannot be excluded. No definite pleural effusion or pneumothorax is identified. The cardiomediastinal silhouette  is borderline normal in size. No acute osseous abnormalities are seen. IMPRESSION: Lungs hypoexpanded. Diffuse bilateral airspace opacification reflects patient's known pulmonary fibrosis. Superimposed infection cannot be excluded. Electronically Signed   By: Garald Balding M.D.   On: 06/05/2015 21:19   Dg Chest Port 1 View  06/04/2015  CLINICAL DATA:  Shortness of breath and tachycardia. History of pulmonary fibrosis. EXAM: PORTABLE CHEST 1 VIEW COMPARISON:  Multiple prior chest x-rays. FINDINGS: The heart is within normal limits in size given the AP projection of the film. The mediastinal and hilar contours are grossly within normal limits. Changes of chronic pulmonary fibrosis with low lung volumes. No obvious acute overlying infiltrate or edema. No large pleural effusion. No pneumothorax. The bony thorax is intact. IMPRESSION: Chronic changes of pulmonary fibrosis. Low lung volumes with vascular crowding and atelectasis. Electronically Signed   By: Marijo Sanes M.D.   On: 06/04/2015 15:50    Assessment & Plan    1. Atrial Flutter: Successful DCCV 3/14 for new onset of Aflutter with RVR on 3/13 after failed chemical conversion w/ flecainide 300mg . Maintaining sinus rhythm on flecainide 50 BID.  Cont CCB. High risk for recurrent AF in setting of pulmonary fibrosis. CHA2DS2VASc = 2. Cont eliquis.  2. Advanced Pulmonary Fibrosis with acute on chronic respiratory failure and hypoxia: Following DCCV 3/14, he developed acute hypoxic respiratory distress.  Continue steroids for IPF flare - pulm following.  Comfortable now on HFNC.   Appreciate CCM and Palliative Care recs.  Remains active DNR/DNI.  Goal to return to SNF to continue rehab when pulmonary status improves but cannot go there on HFNC.  Palliative care working with Pulm to understand options (? Home with hospice).  Signed, Murray Hodgkins NP 06/07/2015, 10:15 AM   I have seen and examined the patient along with Murray Hodgkins NP.   I have reviewed the chart, notes and new data.  I agree with NP's note.  Key new complaints: feels fairly comfortable Key examination changes: maintaining NSR   PLAN: Continue antiarrhythmic/rate control meds as long as they offer symptom palliation. Looking into residential hospice options.  Sanda Klein, MD, [place] [phone] 06/07/2015, 1:04 PM

## 2015-06-07 NOTE — Care Management Important Message (Signed)
Important Message  Patient Details  Name: Joshua Bullock MRN: [id number] Date of Birth: 1933/03/08   Medicare Important Message Given:  Yes    [name] [name] 06/07/2015, 3:19 PM

## 2015-06-08 DIAGNOSIS — K5901 Slow transit constipation: Secondary | ICD-10-CM

## 2015-06-08 DIAGNOSIS — Z7189 Other specified counseling: Secondary | ICD-10-CM | POA: Insufficient documentation

## 2015-06-08 MED ORDER — BISACODYL 10 MG RE SUPP: 10.0000 mg | Freq: Every day | RECTAL | Status: DC | PRN

## 2015-06-08 MED ORDER — LORAZEPAM 0.5 MG PO TABS: 0.5000 mg | ORAL_TABLET | Freq: Four times a day (QID) | ORAL | Status: DC | PRN

## 2015-06-08 MED ORDER — MORPHINE SULFATE (CONCENTRATE) 10 MG/0.5ML PO SOLN
4.0000 mg | Freq: Three times a day (TID) | ORAL | Status: DC
  Administered 2015-06-08 – 2015-06-09 (×5): 4 mg via ORAL
  Filled 2015-06-08 (×4): qty 0.5

## 2015-06-08 MED ORDER — BISACODYL 10 MG RE SUPP: 10.0000 mg | Freq: Every day | RECTAL | Status: DC

## 2015-06-08 MED ORDER — MORPHINE SULFATE (PF) 2 MG/ML IV SOLN: 2.0000 mg | INTRAVENOUS | Status: DC | PRN

## 2015-06-08 MED ORDER — SENNOSIDES-DOCUSATE SODIUM 8.6-50 MG PO TABS
2.0000 | ORAL_TABLET | Freq: Every day | ORAL | Status: DC
  Administered 2015-06-08: 2 via ORAL
  Filled 2015-06-08: qty 2

## 2015-06-08 NOTE — Progress Notes (Signed)
   Name: Joshua Bullock MRN: [id number] DOB: March 28, 1932    ADMISSION DATE:  06/04/2015 CONSULTATION DATE:  06/05/2015  REFERRING MD :  Cardiology  CHIEF COMPLAINT:  Dyspnea   SUBJECTIVE:   Patient is still on High flow nasal cannula , he reports he feels ok.  Patient reports that he stays most of the time in bed, even slightest exertion makes him dyspneic. Marland Kitchen  Met with palliative care 03/15 - DNR confirmed.  Family has planned well; essentially all aspects of funeral service / burial / obituaries have been planned.  Plan per palliative care note is to allow short time to see whether steroids can help and pt can be weaned off of HFNC.  If not, plan of care likely to transition towards comfort. Palliative care has discussed case with Dr. Chase Caller who has followed Joshua Bullock as outpatient regarding trying to possibly get him to [facility] (possible accepting lower SpO2 levels, etc).  VITAL SIGNS: Temp:  [97.6 F (36.4 C)-97.9 F (36.6 C)] 97.9 F (36.6 C) (03/17 0831) Pulse Rate:  [54-72] 65 (03/17 0831) Resp:  [17-33] 18 (03/17 0831) BP: (115-123)/(54-73) 123/54 mmHg (03/17 0831) SpO2:  [91 %-100 %] 98 % (03/17 0903) FiO2 (%):  [28 %-30 %] 28 % (03/17 0903) Weight:  [163 lb 4.8 oz (74.072 kg)] 163 lb 4.8 oz (74.072 kg) (03/17 0400)  PHYSICAL EXAMINATION:  General:  Chronically ill appearing male in respiratory distress on HFNC Neuro:  Alert, oriented, answers all questions appropriately HEENT: Atraumatic, normocephalic, no discharge, no JVD noted Cardiovascular: S1S2, no MRG Lungs:  Coarse bilaterally, no wheeze, crackles, rales noted Abdomen:  Soft, non-tender, non-distended Musculoskeletal:  No acute deformity or ROM limiation Skin:  Grossly intact   Recent Labs Lab 06/05/15 0449 06/06/15 0420 06/07/15 0530  NA 134* 135 133*  K 4.5 4.2 4.2  CL 97* 92* 94*  CO2 '26 30 28  '$ BUN 27* 25* 29*  CREATININE 0.84 0.99 0.91  GLUCOSE 98 118* 169*    Recent Labs Lab  06/05/15 0449 06/06/15 0420 06/07/15 0530  HGB 15.3 17.2* 16.1  HCT 46.4 51.9 45.8  WBC 21.5* 27.1* 24.4*  PLT 253 267 256   No results found.  ASSESSMENT / PLAN:  Acute on chronic hypoxemic respiratory failure - Likely in setting of IPF flare.  Despite leukocytosis, doubt infectious process (favor steroid induced). Cardiology is actively [place].  Pulmonary fibrosis  DNI / DNR Status Plan: Continue HFNC as he has been intolerant of weaning. Continue to attempt weaning (goal SpO2 low 80's; will defer lower levels of acceptance to Dr. Chase Caller)- if unable to wean off then agree comfort care would be best plan (especially given that his disposition would be challenging given that there are no local SNF's that provide HFNC.  If we are able to get him into SpO2 of low 80's, then can hopefully get him discharged to [facility] to allow him to spend his last days with family. PRN BiPAP as needed. Continue steroids - '125mg'$  Solumedrol q 6 hours for now; would gradually wean. Continue BD's. Consider morphine PRN.  Atrial flutter with rapid ventricular response s/p DCCV 3/14 Per cardiology    Joshua Bullock,AG-ACNP Pulmonary & Critical Care

## 2015-06-08 NOTE — Clinical Social Work Note (Addendum)
CSW received call from Imogene Burn with the Palliative Team regarding patient needing residential hospice (approx. 3:02 pm). She also informed CSW that patient wants [facility]. Call made to Erling Conte, CSW with BP and referral made. She will inform CSW later today if they can take patient.  CSW informed later by Erling Conte, CSW that they can take patient on Saturday and paperwork was completed with family for admissions to [facility]. Weekend CSW will be advised.  Nastasha Reising Givens, MSW, LCSW Licensed Clinical Social Worker [place] [phone]

## 2015-06-08 NOTE — Progress Notes (Signed)
Humidity bottle changed out, no complications, pt. Tolerating well.

## 2015-06-08 NOTE — Consult Note (Signed)
[facility] [employer] Received request from [place] for family interest in [facility]. Room is available tomorrow, 06/09/15. Met with patient and spouse to complete paper work for transfer tomorrow. Dr. Orpah Melter to assume care per family request. CSW Lorriane Shire aware.   RN please call report to [phone] and make sure patient transfers with oxymizer.   Please fax discharge summary to [phone].  Please make sure spouse is present before transport is called. She plans to be at hospital around 10:00.  Thank you. Erling Conte, [place]

## 2015-06-08 NOTE — Progress Notes (Signed)
The plans are for inpatient hospice care, attempting to locate a facility that can accommodate his needs. I do not think we would plan another cardioversion or a change in cardiac therapy under these circumstances. Consider DCing flecainide and anticoagulation once transfer to hospice is definitive. Discussed with Dr. Chase Caller who will assume attending role.

## 2015-06-08 NOTE — Progress Notes (Addendum)
Daily Progress Note   Patient Name: Joshua Bullock       Date: 06/08/2015 DOB: 1932-10-13  Age: 82 y.o. MRN#: [id number] Attending Physician: Brand Males, MD Primary Care Physician: No primary care provider on file. Admit Date: 06/04/2015  Reason for Consultation/Follow-up: Establishing goals of care  Patient with terminal pulmonary fibrosis.  Has declined very rapidly since his dx in December 2016.  Subjective: Some constipation.  Did well with trial dose of morphine.  Some increased nervousness today.  Interval Events:  HFNC turned down to 17L.  Dr. Chase Caller indicated that sats above 75% are sufficient as long as he is not having symptoms.   Respiratory therapy plans to start 15L of regular Nasal Cannula.  Respiratory Therapy kind enough to check with pulmonary rehab to see if Mr. Joshua Bullock Oxymizer (diffuser) is still there and usable. ([place]).  Patient and his wife understand his predicament and are accepting of residential hospice.  The patient requests [facility] because he really wants to be able to look outside ([place]    Oral morphine started TID by Dr. Chase Caller.  PRN IV dose available for severe / acute dyspnea.  Ativan ordered PRN as the patient has been having difficulty with nervousness today.   Length of Stay: 4 days  Current Medications: Scheduled Meds:  . azelastine  2 spray Each Nare BID  . citalopram  20 mg Oral Daily  . diltiazem  180 mg Oral Daily  . flecainide  50 mg Oral Q12H  . fluticasone  1 spray Each Nare Daily  . ipratropium-albuterol  3 mL Nebulization Q6H  . methylPREDNISolone (SOLU-MEDROL) injection  125 mg Intravenous Q6H  . morphine CONCENTRATE  4 mg Oral 3 times per day  . multivitamin with minerals  1 tablet  Oral Daily  . pantoprazole  40 mg Oral Daily  . senna-docusate  2 tablet Oral QHS  . sodium chloride flush  3 mL Intravenous Q12H    Continuous Infusions: . sodium chloride      PRN Meds: LORazepam, morphine injection, promethazine, sodium chloride flush   Physical Exam        Physical Exam  Wd, Thin, frail, very pleasant, Alert.  Continuously moving his hands thru out my visit. CV RRR Resp: SOB with speaking. HFNC at 17L  Abd:  Thin, nd, nt, +bs Ext: No edema, able to move all 4.        Vital Signs: BP 123/68 mmHg  Pulse 65  Temp(Src) 97.6 F (36.4 C) (Axillary)  Resp 28  Ht 5' 10.5" (1.791 m)  Wt 74.072 kg (163 lb 4.8 oz)  BMI 23.09 kg/m2  SpO2 91% SpO2: SpO2: 91 % O2 Device: O2 Device: High Flow Nasal Cannula O2 Flow Rate: O2 Flow Rate (L/min): 15 L/min  Intake/output summary:  Intake/Output Summary (Last 24 hours) at 06/08/15 1504 Last data filed at 06/08/15 1344  Gross per 24 hour  Intake    600 ml  Output   1375 ml  Net   -775 ml   LBM: Last BM Date: 06/05/15 Baseline Weight: Weight: 81.194 kg (179 lb) Most recent weight: Weight: 74.072 kg (163 lb 4.8 oz)       Palliative Assessment/Data: Flowsheet Rows        Most Recent Value   Intake Tab    Referral Department  Cardiology   Unit at Time of Referral  Intermediate Care Unit   Palliative Care Primary Diagnosis  Pulmonary   Date Notified  06/05/15   Palliative Care Type  New Palliative care   Reason for referral  Clarify Goals of Care   Date of Admission  06/06/15   Date first seen by Palliative Care  06/06/15   # of days Palliative referral response time  1 Day(s)   # of days IP prior to Palliative referral  -1   Clinical Assessment    Palliative Performance Scale Score  30%   Dyspnea Max Last 24 Hours  8   Dyspnea Min Last 24 hours  2   Psychosocial & Spiritual Assessment    Palliative Care Outcomes    Patient/Family meeting held?  Yes   Who was at the meeting?  Wife, brother and  patient   Palliative Care Outcomes  Clarified goals of care      Additional Data Reviewed: CBC    Component Value Date/Time   WBC 24.4* 06/07/2015 0530   RBC 5.23 06/07/2015 0530   HGB 16.1 06/07/2015 0530   HCT 45.8 06/07/2015 0530   PLT 256 06/07/2015 0530   MCV 87.6 06/07/2015 0530   MCH 30.8 06/07/2015 0530   MCHC 35.2 06/07/2015 0530   RDW 14.9 06/07/2015 0530   LYMPHSABS 1.8 06/07/2015 0530   MONOABS 0.6 06/07/2015 0530   EOSABS 0.0 06/07/2015 0530   BASOSABS 0.0 06/07/2015 0530    CMP     Component Value Date/Time   NA 133* 06/07/2015 0530   K 4.2 06/07/2015 0530   CL 94* 06/07/2015 0530   CO2 28 06/07/2015 0530   GLUCOSE 169* 06/07/2015 0530   BUN 29* 06/07/2015 0530   CREATININE 0.91 06/07/2015 0530   CALCIUM 8.6* 06/07/2015 0530   GFRNONAA >60 06/07/2015 0530   GFRAA >60 06/07/2015 0530       Problem List:  Patient Active Problem List   Diagnosis Date Noted  . Acute respiratory failure with hypoxia ([place])   . Hypoxia   . Acute on chronic respiratory failure with hypoxia ([place])   . Dyspnea   . Anxiety state   . Palliative care encounter   . Goals of care, counseling/discussion   . Pulmonary fibrosis ([place]) 06/04/2015  . Interstitial lung disease ([place]) 06/04/2015  . PAF (paroxysmal atrial fibrillation) ([place]) 06/04/2015  . Atrial flutter with rapid ventricular response ([place]) 06/04/2015  . Chronic anticoagulation -  Eliquis 06/04/2015  . Atrial flutter ([place]) 06/04/2015     Palliative Care Assessment & Plan    1.Code Status:  DNR   2. Goals of Care/Additional Recommendations:  DNR/DNI  To Residential Hospice.  Family gathering from remote locations  Morphine for dyspnea  Ativan for anxiety   5. Prognosis: < 2 weeks.  Terminal pulmonary fibrosis.  Now bed bound. Unable to eat or speak without severe dyspnea even on oxygen therapy.  6. Discharge Planning:  Hospice facility   Care plan was discussed with patient, wife, Dr.  Chase Caller, Lorriane Shire of Social work.  I will return to work on Monday 3/20.  Please call the PMT office this weekend if assistance is needed before Monday.   [phone]  Thank you for allowing the Palliative Medicine Team to assist in the care of this patient.   Time In: 2:00  Time Out: 3:00 Total Time 60 Prolonged Time Billed no        Imogene Burn, [place] Palliative Medicine Pager: [phone]  06/08/2015, 3:04 PM  Please contact Palliative Medicine Team phone at [phone] for questions and concerns.

## 2015-06-09 MED ORDER — DILTIAZEM HCL ER COATED BEADS 180 MG PO CP24: 180.0000 mg | ORAL_CAPSULE | Freq: Every day | ORAL | Status: AC

## 2015-06-09 MED ORDER — MORPHINE SULFATE (CONCENTRATE) 10 MG/0.5ML PO SOLN: 4.0000 mg | Freq: Three times a day (TID) | ORAL | Status: AC

## 2015-06-09 MED ORDER — FLECAINIDE ACETATE 50 MG PO TABS: 50.0000 mg | ORAL_TABLET | Freq: Two times a day (BID) | ORAL | Status: AC

## 2015-06-09 MED ORDER — DOCUSATE SODIUM 100 MG PO CAPS: 100.0000 mg | ORAL_CAPSULE | Freq: Every day | ORAL | Status: AC | PRN

## 2015-06-09 MED ORDER — LORAZEPAM 0.5 MG PO TABS: 0.5000 mg | ORAL_TABLET | Freq: Four times a day (QID) | ORAL | Status: AC | PRN

## 2015-06-09 MED ORDER — DOCUSATE SODIUM 100 MG PO CAPS
100.0000 mg | ORAL_CAPSULE | Freq: Every day | ORAL | Status: DC | PRN
  Administered 2015-06-09: 100 mg via ORAL
  Filled 2015-06-09: qty 1

## 2015-06-09 MED ORDER — IPRATROPIUM-ALBUTEROL 0.5-2.5 (3) MG/3ML IN SOLN: 3.0000 mL | Freq: Four times a day (QID) | RESPIRATORY_TRACT | Status: AC

## 2015-06-09 NOTE — Discharge Summary (Signed)
Physician Discharge Summary  Patient ID: Joshua Bullock MRN: [id number] DOB/AGE: [birth year] 82 y.o.  Admit date: 06/04/2015 Discharge date: 06/09/2015  Problem List Principal Problem:   Atrial flutter with rapid ventricular response (HCC) Active Problems:   Pulmonary fibrosis (HCC)   Interstitial lung disease (HCC)   PAF (paroxysmal atrial fibrillation) (HCC)   Chronic anticoagulation - Eliquis   Acute respiratory failure with hypoxia (HCC)   Hypoxia   Acute on chronic respiratory failure with hypoxia (HCC)   Dyspnea   Anxiety state   Palliative care encounter   Goals of care, counseling/discussion   Slow transit constipation   Encounter for hospice care discussion  HPI: 82 year old male with known history of paroxysmal atrial fibrillation on chronic anticoagulation therapy with Eliquis, as well as a history of COPD/idiopathic pulmonary fibrosis, followed by Dr. Chase Caller, who presents to the [facility] ED with worsening dyspnea in the setting of rapid atrial flutter.  82 year old male with PMH as below, which includes Atrial fibrillation (diagnosed 06/2014, on eliquis), Pulmonary fibrosis (on 4L at home 6L exertion. followed by Dr. Lenna Gilford, and Dr. Chase Caller. Hi-res CT is c/w UIP process and collagen vasc screen is essentially neg). He was recently admitted to [facility] 3/4 to 3/7 for dyspena. He was evaluated by Dr. Lake Bells at that time who felt the patient had "Diffuse parenchymal lung disease: UIP on imaging; not clear if this represents a recurrent "flare" of IPF or if this is a steroid responsive ILD that we have been calling IPF. I favor the latter. Will plan for slow taper and will have him f/u with Dr. Chase Caller for second opinion as previously planned. Would favor proceeding with anti-inflammatory treatment and would consider adding Cellcept as steroid sparing agent, but will defer to [facility]. We are holding Esbriet for now due to side effects and questionable diagnosis of IPF.  Needs CCP antibody sent but no sense in doing that while on steroids as he could have a false negative later misinterpreted." His course improved with steroids and diuresis and he was discharged to SNF. He reports feeling very week since time of discharge.  3/13 he presented to [facility] ED with complaints of worsening dyspnea. He was again noted to be in AF RVR. He was initially rate controlled on diltiazem and he was started on flecainide, however, this did not restore sinus rhythm. He underwent DCCV 3/14 at [facility]. Later that evening he had worsening SOB and hypoxemia requiring BiPAP. PCCM called for further evaluation.  Hospital Course:  ASSESSMENT / PLAN:  Acute on chronic hypoxemic respiratory failure - Likely in setting of IPF flare. Despite leukocytosis, doubt infectious process (favor steroid induced). Cardiology is actively [place].  Pulmonary fibrosis  DNI / DNR Status Plan: Continue HFNC as he has been intolerant of weaning. Continue to attempt weaning (goal SpO2 low 80's; will defer lower levels of acceptance to Dr. Chase Caller)- if unable to wean off then agree comfort care would be best plan (especially given that his disposition would be challenging given that there are no local SNF's that provide HFNC. Transfer to [facility] 3/18 for comfort care. On 4 l/l oximyzer   Continue BD's. Mso4 and ativan prn  Atrial flutter with rapid ventricular response s/p DCCV 3/14 Continue current rate control medications     Labs at discharge Lab Results  Component Value Date   CREATININE 0.91 06/07/2015   BUN 29* 06/07/2015   NA 133* 06/07/2015   K 4.2 06/07/2015   CL 94* 06/07/2015  CO2 28 06/07/2015   Lab Results  Component Value Date   WBC 24.4* 06/07/2015   HGB 16.1 06/07/2015   HCT 45.8 06/07/2015   MCV 87.6 06/07/2015   PLT 256 06/07/2015   No results found for: ALT, AST, GGT, ALKPHOS, BILITOT Lab Results  Component Value Date   INR 1.45 06/07/2015   INR 1.46  06/06/2015   INR 1.21 06/05/2015    Current radiology studies No results found.  Disposition:  Final discharge disposition not confirmed     Medication List    STOP taking these medications        apixaban 5 MG Tabs tablet  Commonly known as:  ELIQUIS     CALCIUM-VITAMIN D PO     multivitamin with minerals Tabs tablet     predniSONE 10 MG (21) Tbpk tablet  Commonly known as:  STERAPRED UNI-PAK 21 TAB     Testosterone 20.25 MG/ACT (1.62%) Gel      TAKE these medications        azelastine 0.1 % nasal spray  Commonly known as:  ASTELIN  Place 2 sprays into both nostrils 2 (two) times daily. Use in each nostril as directed     citalopram 20 MG tablet  Commonly known as:  CELEXA  Take 20 mg by mouth daily.     diltiazem 180 MG 24 hr capsule  Commonly known as:  CARDIZEM CD  Take 1 capsule (180 mg total) by mouth daily.     flecainide 50 MG tablet  Commonly known as:  TAMBOCOR  Take 1 tablet (50 mg total) by mouth every 12 (twelve) hours.     fluticasone 50 MCG/ACT nasal spray  Commonly known as:  FLONASE  Place 1 spray into both nostrils daily.     Fluticasone-Salmeterol 250-50 MCG/DOSE Aepb  Commonly known as:  ADVAIR  Inhale 1 puff into the lungs every 12 (twelve) hours.     hydrocortisone cream 1 %  Apply 1 application topically 2 (two) times daily as needed for itching.     ipratropium-albuterol 0.5-2.5 (3) MG/3ML Soln  Commonly known as:  DUONEB  Take 3 mLs by nebulization every 6 (six) hours.     LORazepam 0.5 MG tablet  Commonly known as:  ATIVAN  Take 1 tablet (0.5 mg total) by mouth every 6 (six) hours as needed for anxiety or sleep.     morphine CONCENTRATE 10 MG/0.5ML Soln concentrated solution  Take 0.2 mLs (4 mg total) by mouth every 8 (eight) hours.     omeprazole 20 MG capsule  Commonly known as:  PRILOSEC  Take 20 mg by mouth daily.     pravastatin 40 MG tablet  Commonly known as:  PRAVACHOL  Take 40 mg by mouth daily.      PRESERVISION AREDS 2 Caps  Take 2 capsules by mouth 2 (two) times daily.      ASK your doctor about these medications        aspirin 81 MG tablet  Take 81 mg by mouth daily.          Discharged Condition: poor  Time spent on discharge greater than 40 minutes.  Vital signs at Discharge. Temp:  [97.5 F (36.4 C)-97.9 F (36.6 C)] 97.8 F (36.6 C) (03/18 1236) Pulse Rate:  [58-88] 88 (03/18 1242) Resp:  [18-38] 35 (03/18 1242) BP: (119-153)/(67-79) 140/76 mmHg (03/18 1242) SpO2:  [79 %-100 %] 85 % (03/18 1242) Weight:  [155 lb 8 oz (70.534 kg)] 155 lb 8  oz (70.534 kg) (03/18 0425) Office follow up Special Information or instructions. He will be followed by [facility]. Signed: Richardson Landry Minor ACNP Maryanna Shape PCCM Pager [phone] till 3 pm If no answer page [phone] 06/09/2015, 12:53 PM   Baltazar Apo, MD, PhD 06/09/2015, 3:38 PM [place] [facility] [phone] or if no answer [phone]

## 2015-06-09 NOTE — Progress Notes (Signed)
Pt discharged to [facility] by way of [facility],

## 2015-06-09 NOTE — Progress Notes (Signed)
Called report to [facility]. ?

## 2015-06-09 NOTE — Progress Notes (Signed)
Pt transferred to: [facility] Anticipated date of transfer: 06/09/15 Transported by: ambulance Time Tentatively Scheduled for: 1:30 PM Family notified:Wife Wells Guiles and son Report # given to nursing to call report. CSW spoke with Erling Conte, [place] of d/c; she is aware of same. DC summary sent to faclilty and EMS called to transport patient. CSW met with wife, patient and son this morning. They are agreeable to d/c. Patient was sleeping for most of the visit but was arousable and is aware of d/c.  No further CSW needs identified.    CSW signing off.  Kendell Bane, LCSW [phone] (weekend coverage)

## 2015-06-11 ENCOUNTER — Telehealth: Payer: Self-pay | Admitting: Internal Medicine

## 2015-06-11 ENCOUNTER — Encounter (HOSPITAL_COMMUNITY): Payer: Self-pay | Admitting: Emergency Medicine

## 2015-06-11 NOTE — Telephone Encounter (Signed)
Will let Dr. Chase Caller know.

## 2015-06-11 NOTE — Telephone Encounter (Signed)
Joshua Bullock  Let wife of [name] know that when I rounded Friday 3/17 I could not talk to her and Saturday I was off but am aware of [facility] transfer and I can be his hospice doc and am available for any questions   Thanks  Dr. Brand Males, M.D., [facility].C.P Pulmonary and Critical Care Medicine Staff Physician [place] [facility] Pager: [phone], If no answer or between  15:00h - 7:00h: call [phone]  06/11/2015 9:44 AM

## 2015-06-11 NOTE — Telephone Encounter (Signed)
Please see other phone note from Jun 19, 2015. Pt is deceased. Will sign off.

## 2015-06-11 NOTE — Telephone Encounter (Signed)
Ignore message sent to [place]. Instead pls give me condolence card

## 2015-06-12 ENCOUNTER — Encounter (HOSPITAL_COMMUNITY): Payer: Medicare Other

## 2015-06-14 ENCOUNTER — Encounter (HOSPITAL_COMMUNITY): Payer: Medicare Other

## 2015-06-15 ENCOUNTER — Ambulatory Visit: Payer: Medicare Other | Admitting: Adult Health

## 2015-06-18 ENCOUNTER — Telehealth: Payer: Self-pay | Admitting: Internal Medicine

## 2015-06-18 NOTE — Telephone Encounter (Signed)
Daneil Dan  Pls get condolence card for [employer]  Also, pls look at [employer] SAE form - plse fill out as much as you can  THanks  Dr. Brand Males, M.D., [facility].C.P Pulmonary and Critical Care Medicine Staff Physician [place] [facility] Pager: [phone], If no answer or between  15:00h - 7:00h: call [phone]  06/18/2015 8:14 AM

## 2015-06-18 NOTE — Telephone Encounter (Signed)
Will complete the condolence card and place in MR's lookats and will complete as much of SAE form as possible, will have MR review form before submitting. Nothing further needed at this time.

## 2015-06-19 ENCOUNTER — Encounter (HOSPITAL_COMMUNITY): Payer: Medicare Other

## 2015-06-21 ENCOUNTER — Encounter (HOSPITAL_COMMUNITY): Payer: Medicare Other

## 2015-06-22 ENCOUNTER — Inpatient Hospital Stay: Payer: Medicare Other | Admitting: Internal Medicine

## 2015-06-23 DEATH — deceased

## 2015-06-26 ENCOUNTER — Encounter (HOSPITAL_COMMUNITY): Payer: Medicare Other

## 2015-06-28 ENCOUNTER — Encounter (HOSPITAL_COMMUNITY): Payer: Medicare Other

## 2015-07-03 ENCOUNTER — Encounter (HOSPITAL_COMMUNITY): Payer: Medicare Other

## 2015-07-05 ENCOUNTER — Encounter (HOSPITAL_COMMUNITY): Payer: Medicare Other

## 2015-07-09 ENCOUNTER — Ambulatory Visit: Payer: Medicare Other | Admitting: Endocrinology

## 2015-07-10 ENCOUNTER — Encounter (HOSPITAL_COMMUNITY): Payer: Medicare Other

## 2015-07-11 ENCOUNTER — Telehealth: Payer: Self-pay | Admitting: Internal Medicine

## 2015-07-11 NOTE — Telephone Encounter (Signed)
error 

## 2015-07-12 ENCOUNTER — Encounter (HOSPITAL_COMMUNITY): Payer: Medicare Other

## 2015-07-17 ENCOUNTER — Encounter (HOSPITAL_COMMUNITY): Payer: Medicare Other

## 2015-07-19 ENCOUNTER — Encounter (HOSPITAL_COMMUNITY): Payer: Medicare Other

## 2015-07-24 ENCOUNTER — Encounter (HOSPITAL_COMMUNITY): Payer: Medicare Other

## 2015-07-26 ENCOUNTER — Encounter (HOSPITAL_COMMUNITY): Payer: Medicare Other

## 2015-07-31 ENCOUNTER — Encounter (HOSPITAL_COMMUNITY): Payer: Medicare Other

## 2015-08-02 ENCOUNTER — Encounter (HOSPITAL_COMMUNITY): Payer: Medicare Other

## 2015-08-07 ENCOUNTER — Encounter (HOSPITAL_COMMUNITY): Payer: Medicare Other

## 2015-08-09 ENCOUNTER — Encounter (HOSPITAL_COMMUNITY): Payer: Medicare Other

## 2016-02-28 IMAGING — CR DG CHEST 1V PORT
1 series · 1 of 1 positions shown · non-contrast
Comparison: 06/23/2014

CLINICAL DATA: COPD.  Crackles.

EXAM:
PORTABLE CHEST - 1 VIEW

[AP]
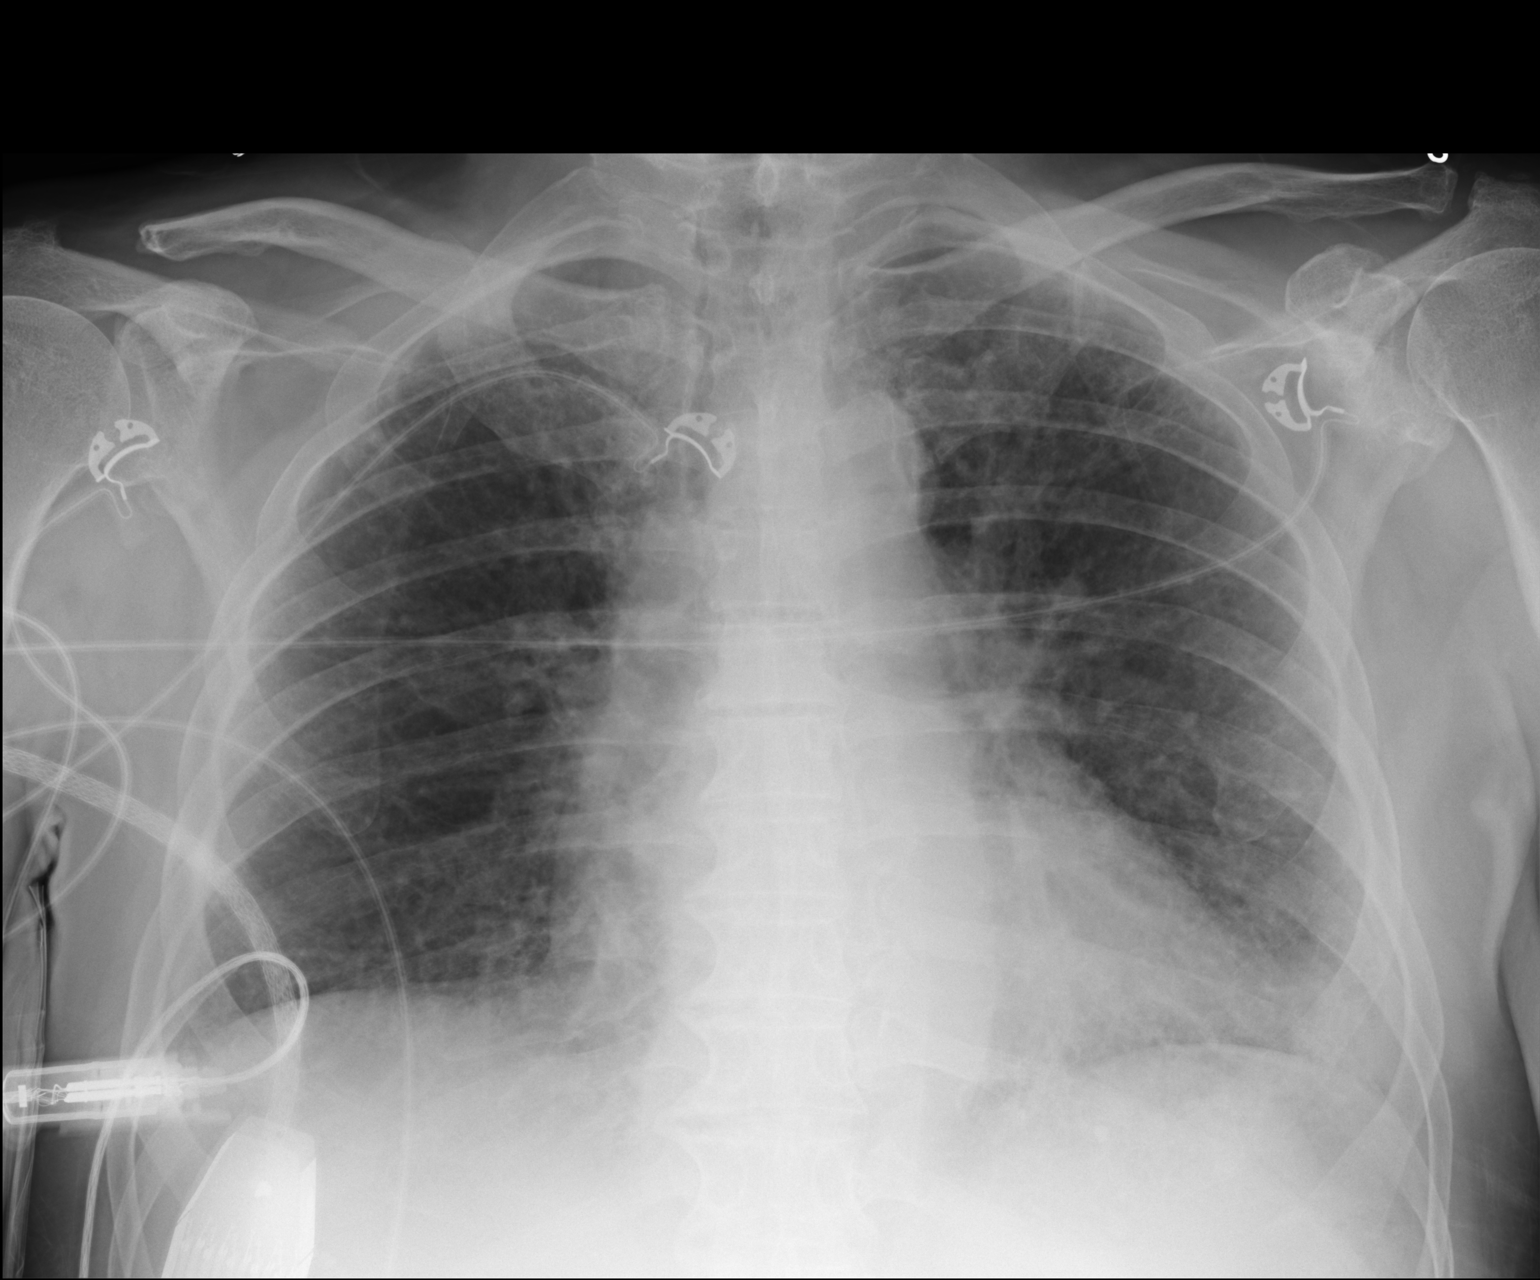

[1 of 1 positions shown; findings below may reference images not displayed]

FINDINGS: Midline trachea. Cardiomegaly accentuated by AP portable technique.
Lower lobe predominant pulmonary interstitial thickening is not
significantly changed. No pleural effusion or pneumothorax. No lobar
consolidation. Hyperinflation.
IMPRESSION: COPD/chronic bronchitis.  No acute superimposed process.

## 2016-03-13 ENCOUNTER — Encounter: Payer: Self-pay | Admitting: Gastroenterology

## 2016-10-17 IMAGING — DX DG CHEST 2V
2 series · 2 of 2 positions shown · non-contrast
Comparison: CT 01/15/2015. Chest x-ray 06/25/2014, 06/23/2014,
11/06/2009.

CLINICAL DATA: Pulmonary fibrosis.

EXAM:
CHEST  2 VIEW

[chest pa]
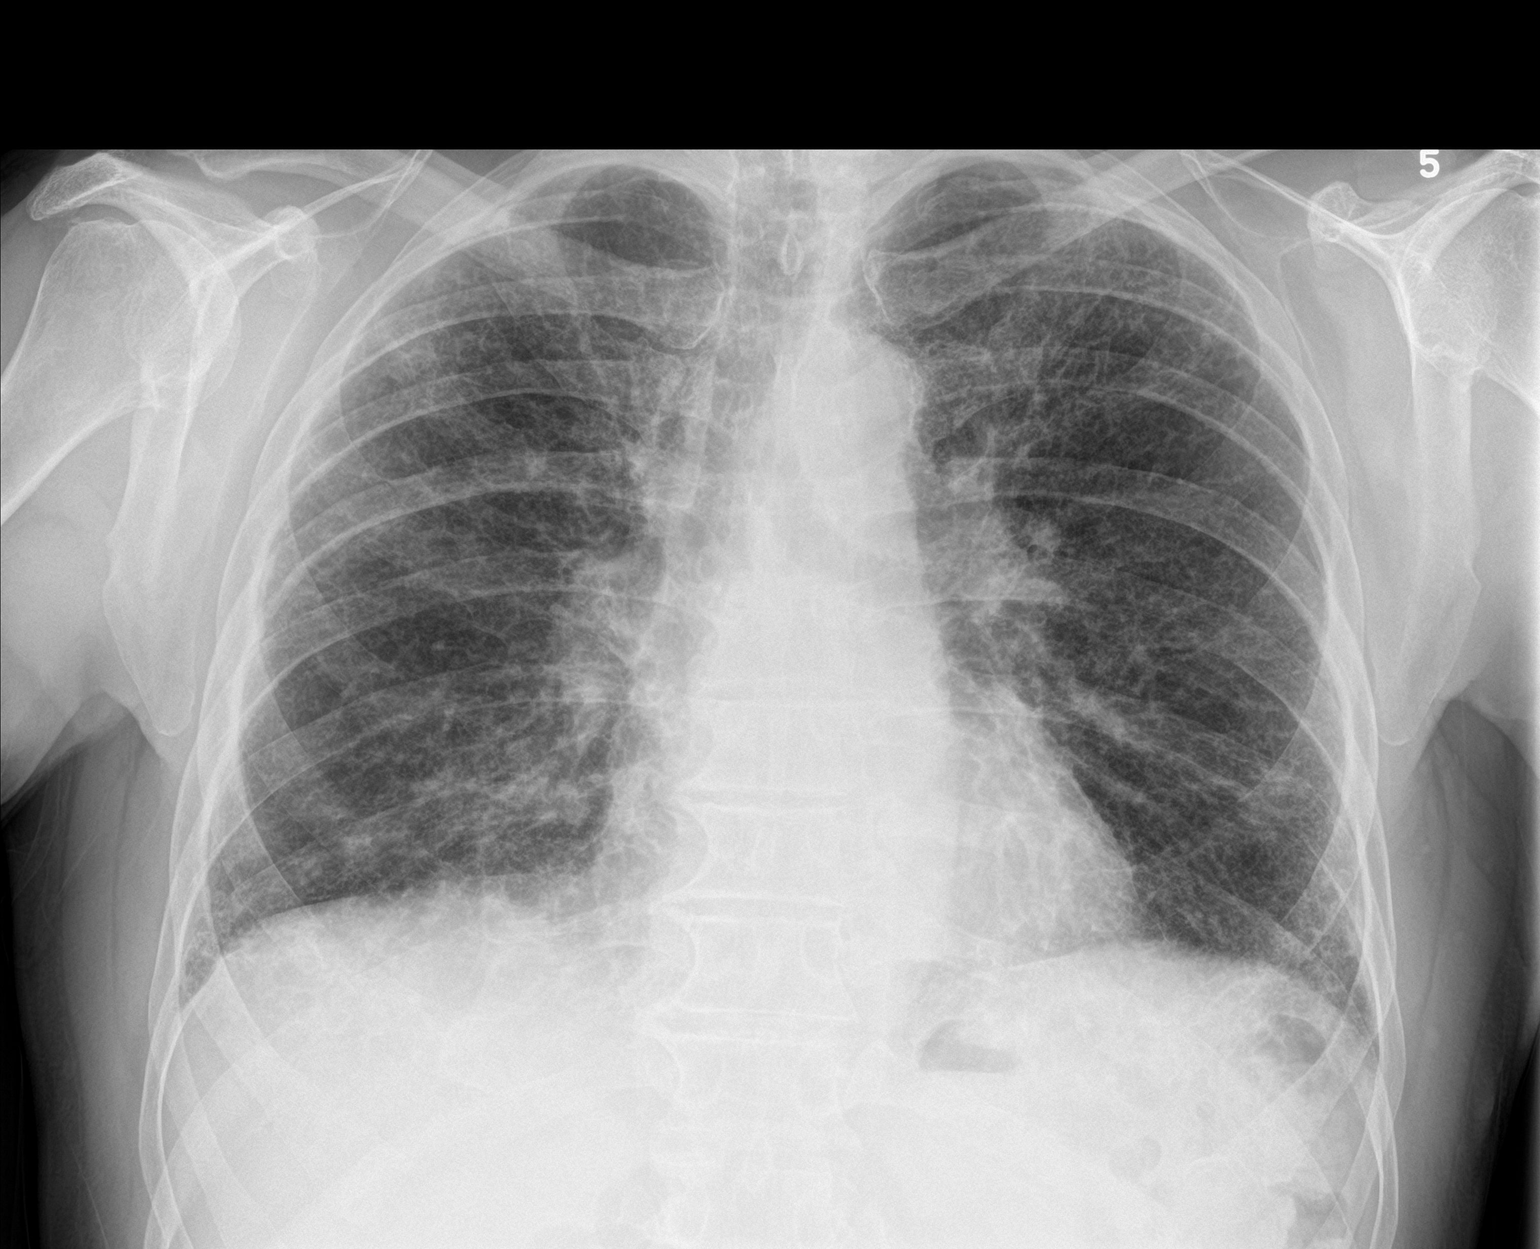

[chest lat]
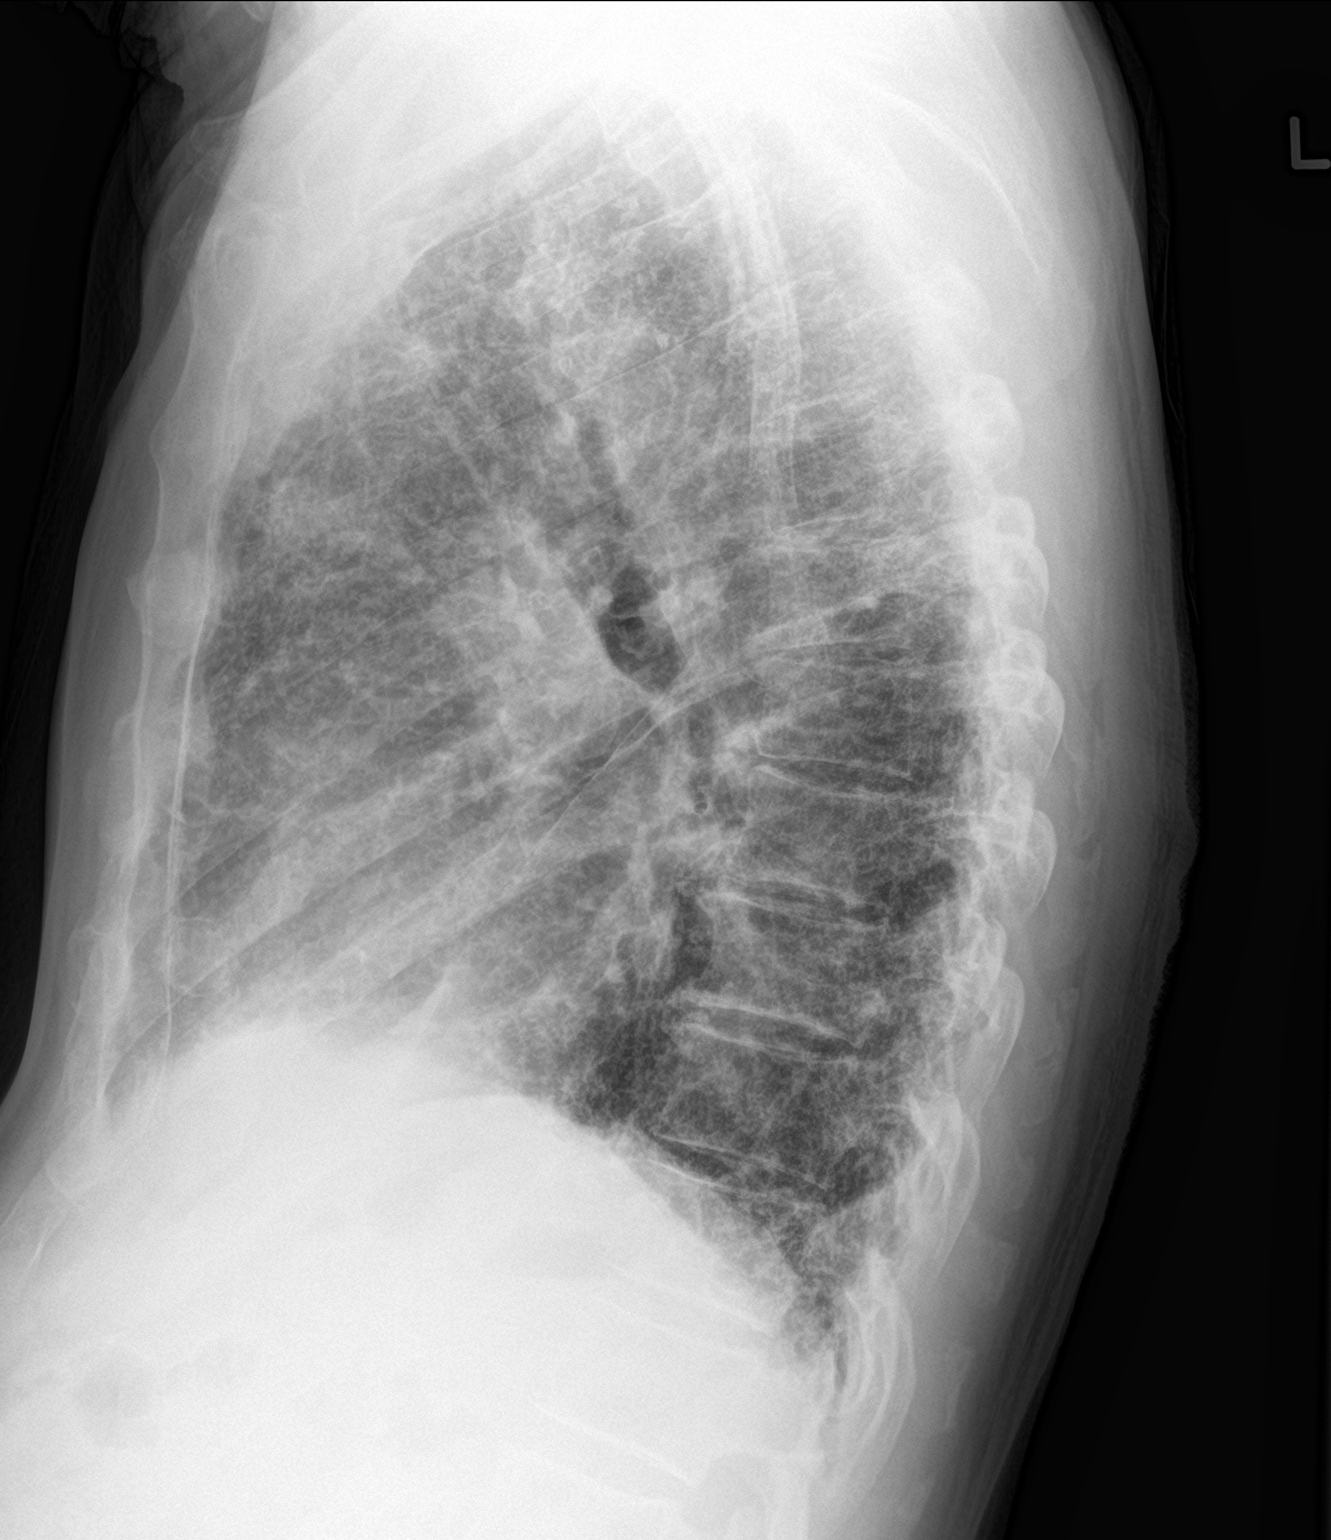

[2 of 2 positions shown; findings below may reference images not displayed]

FINDINGS: Mediastinum and hilar structures are normal. Heart size normal.
Persistent pulmonary interstitial prominence noted. Interstitial
prominence is increased slightly from prior chest x-ray 06/25/2014.
Reference is made to prior CT report of 01/15/2015. Findings are are
consistent with possible active pneumonitis superimposed on chronic
interstitial lung disease. No pleural effusion or pneumothorax.
IMPRESSION: Findings suggesting bilateral interstitial pneumonitis superimposed
on chronic interstitial lung disease. Reference is made to prior CT
report of 01/15/2015.

## 2017-01-30 IMAGING — DX DG CHEST 2V
2 series · 2 of 2 positions shown · non-contrast
Comparison: 05/26/2015

CLINICAL DATA: Acute respiratory failure with hypoxemia. COPD.
Former smoker.

EXAM:
CHEST  2 VIEW

[chest lat]
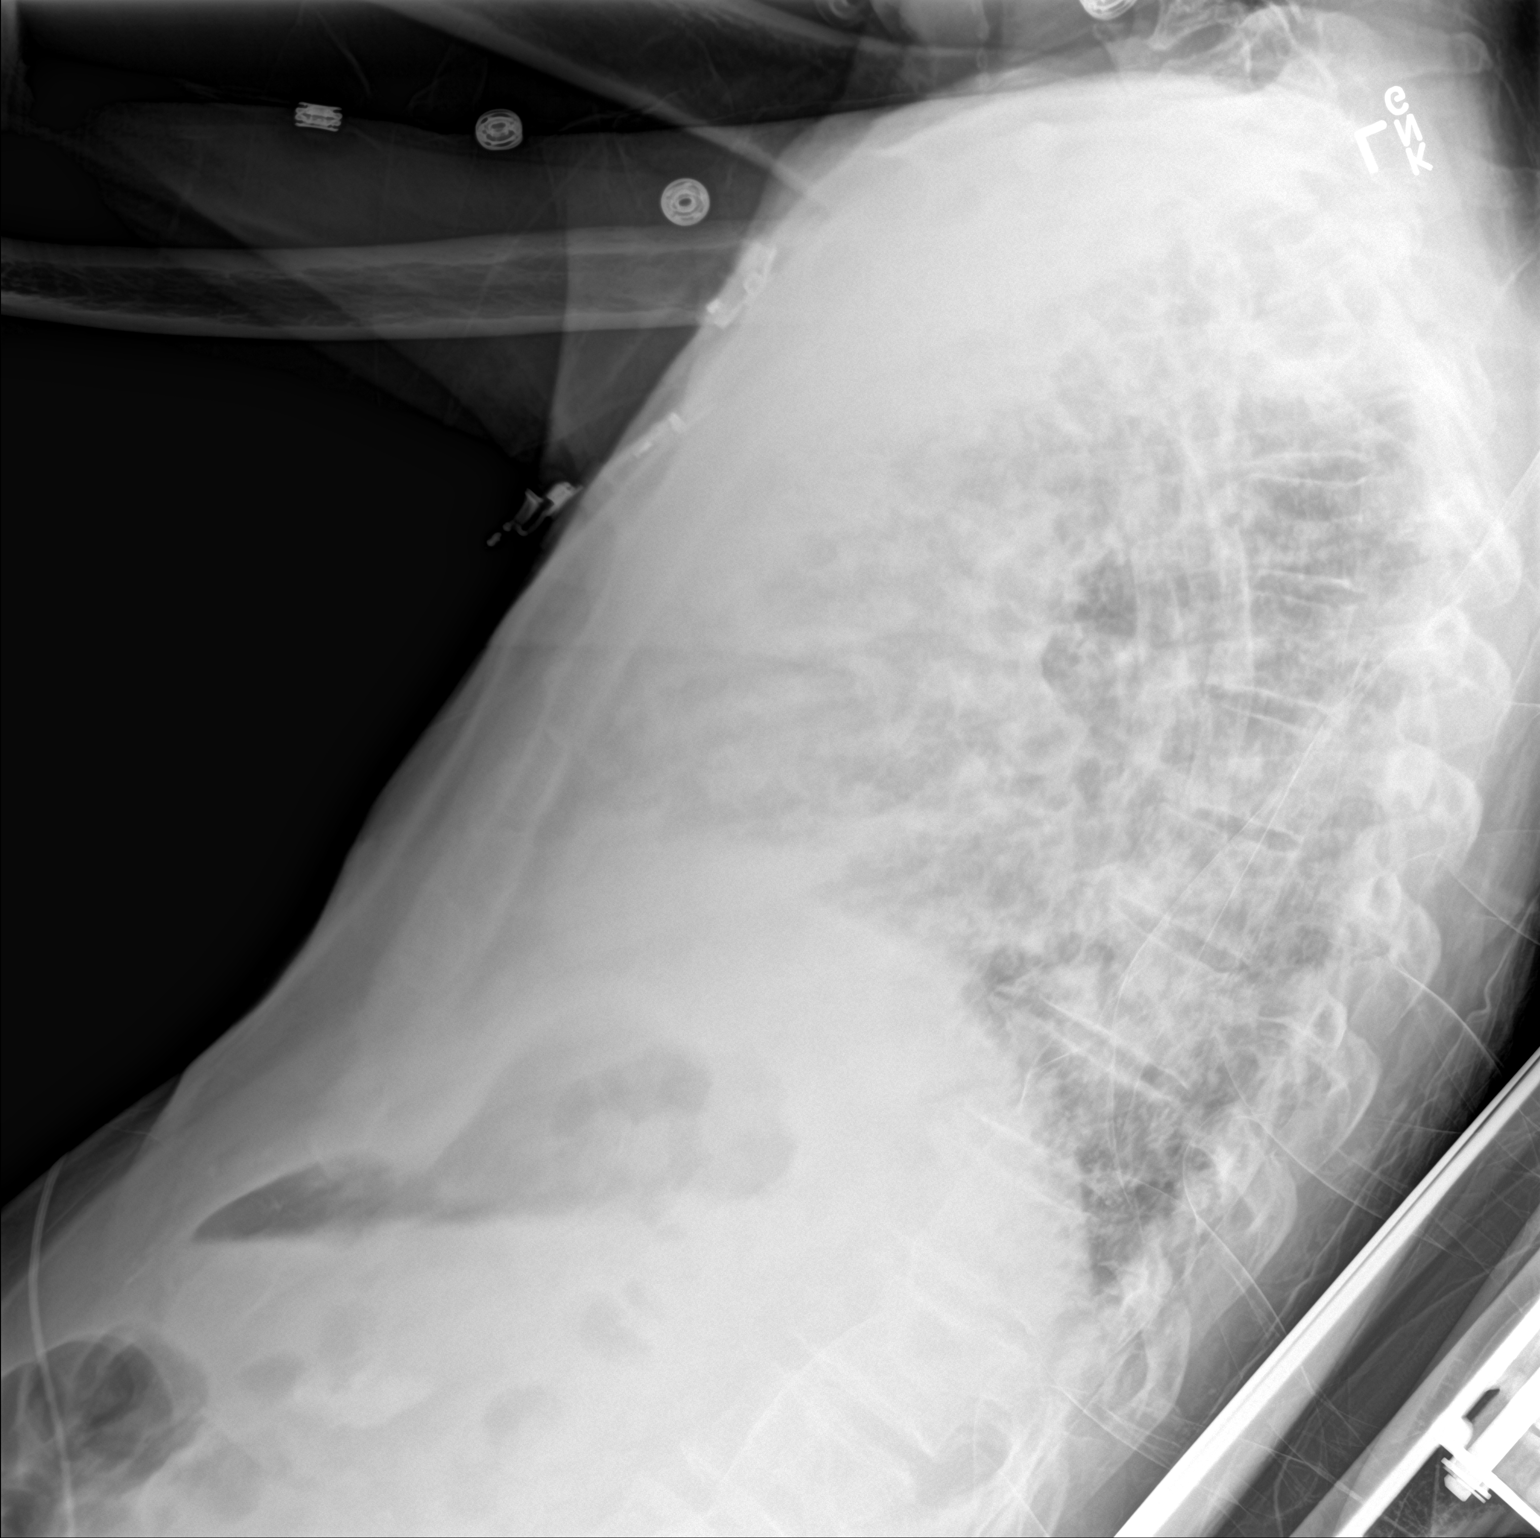

[chest ap]
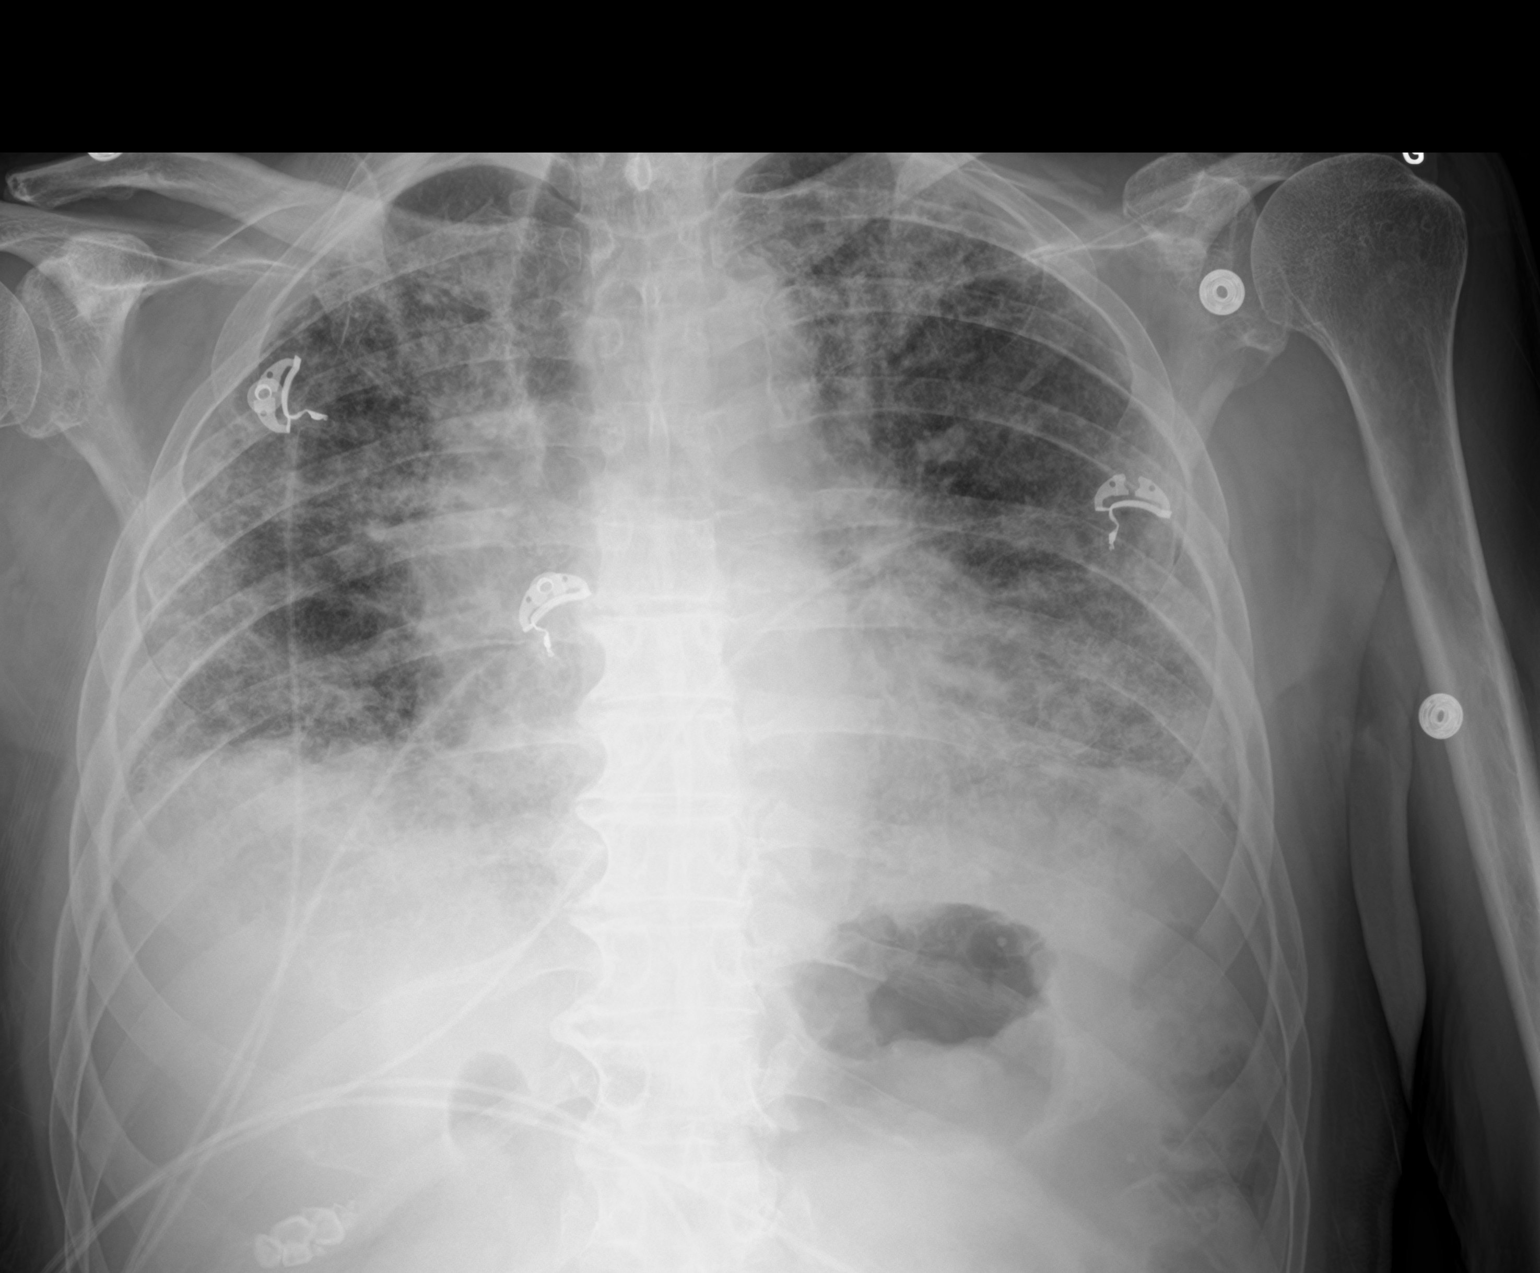

[2 of 2 positions shown; findings below may reference images not displayed]

FINDINGS: Low lung volumes again noted. Extensive bilateral irregular
interstitial densities. These are stable when compared to the prior
exam. No pleural effusion or pneumothorax.

Cardiac silhouette is normal size. No mediastinal or hilar masses or
convincing adenopathy.
IMPRESSION: 1. No significant change from the prior study.
2. Extensive bilateral irregular interstitial densities consistent
with interstitial fibrosis. As mentioned on the prior study,
superimposed infection is possible. No convincing pulmonary edema.

## 2017-02-07 IMAGING — DX DG CHEST 1V PORT
1 series · 1 of 1 positions shown · non-contrast
Comparison: Chest radiograph from 06/04/2015

CLINICAL DATA: Acute onset of respiratory distress. Initial
encounter.

EXAM:
PORTABLE CHEST 1 VIEW

[chest ap]
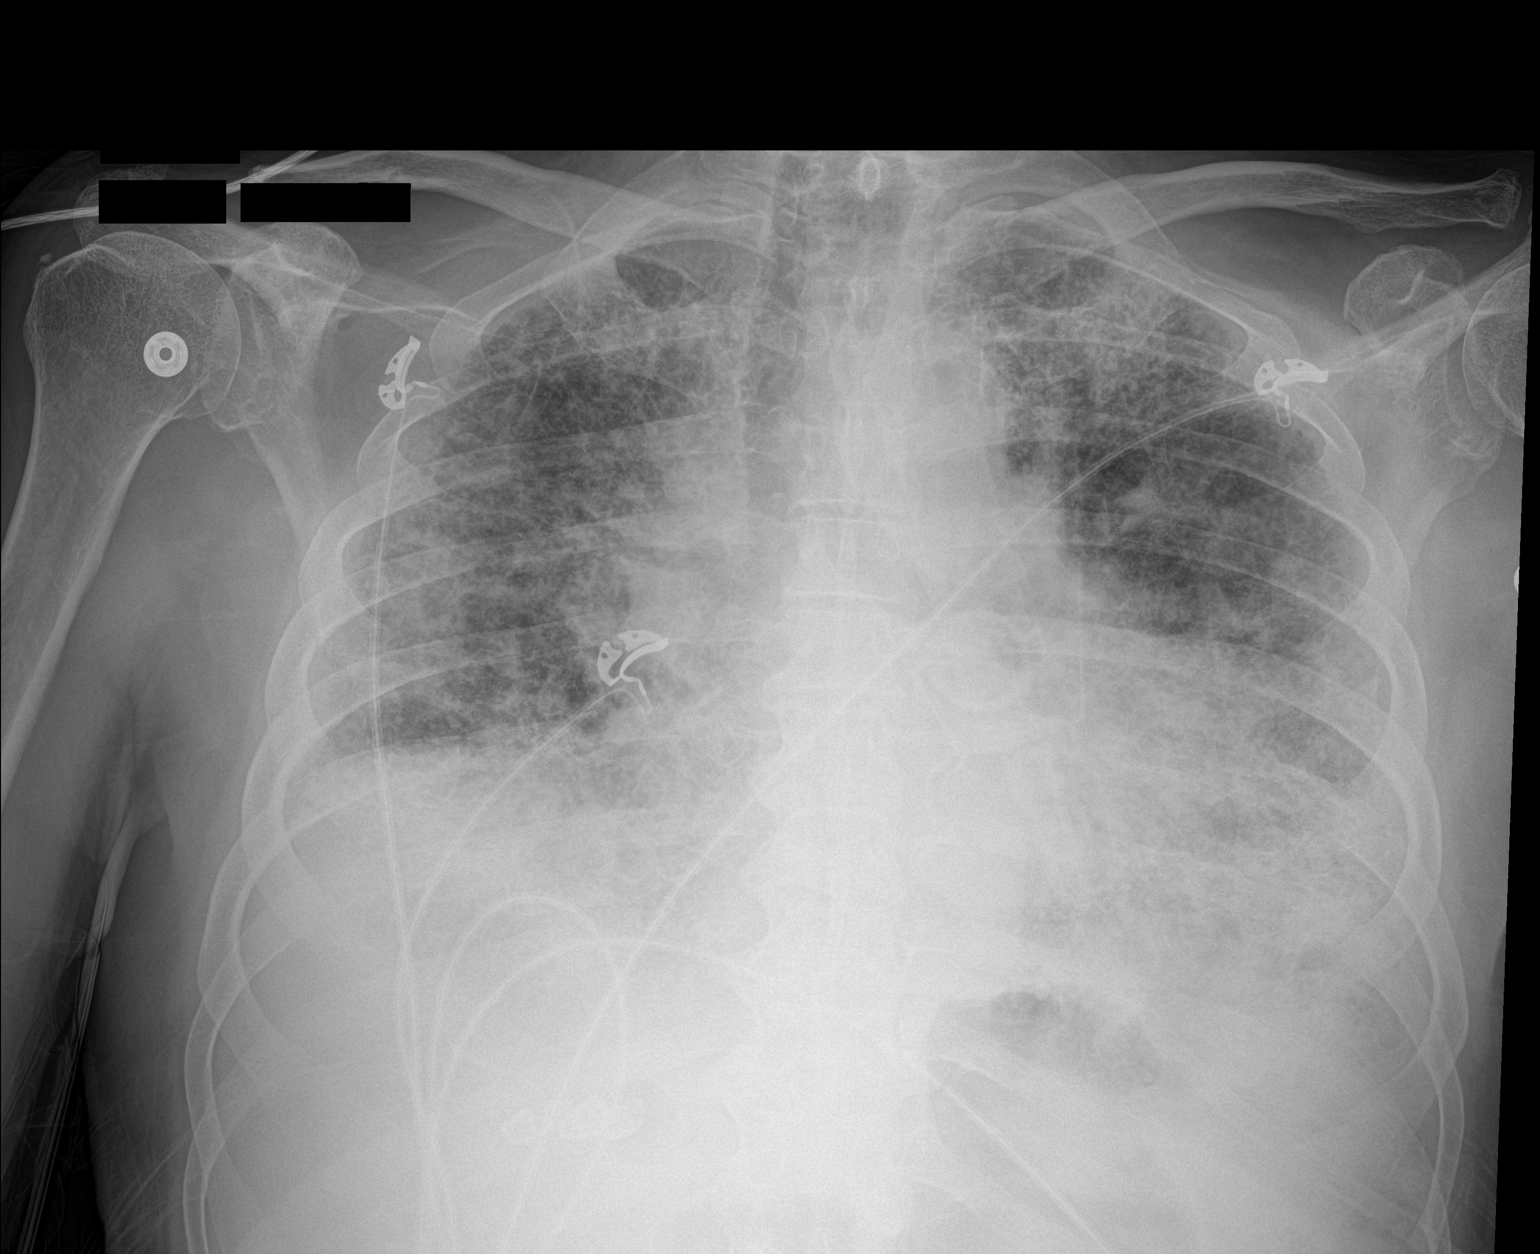

[1 of 1 positions shown; findings below may reference images not displayed]

FINDINGS: The lungs are hypoexpanded. Diffuse bilateral airspace opacification
reflects the patient's known pulmonary fibrosis. Superimposed
infection cannot be excluded. No definite pleural effusion or
pneumothorax is identified.

The cardiomediastinal silhouette is borderline normal in size. No
acute osseous abnormalities are seen.
IMPRESSION: Lungs hypoexpanded. Diffuse bilateral airspace opacification
reflects patient's known pulmonary fibrosis. Superimposed infection
cannot be excluded.
